# Patient Record
Sex: Female | Born: 1975 | Race: White | Hispanic: No | Marital: Married | Smoking: Never smoker
Health system: Southern US, Community
[De-identification: ages and names within clinical notes are randomized; demographics above are authoritative.]

## PROBLEM LIST (undated history)

## (undated) DIAGNOSIS — R519 Headache, unspecified: Secondary | ICD-10-CM

## (undated) DIAGNOSIS — M797 Fibromyalgia: Secondary | ICD-10-CM

## (undated) DIAGNOSIS — Z803 Family history of malignant neoplasm of breast: Secondary | ICD-10-CM

## (undated) DIAGNOSIS — Z9889 Other specified postprocedural states: Secondary | ICD-10-CM

## (undated) DIAGNOSIS — J45909 Unspecified asthma, uncomplicated: Secondary | ICD-10-CM

## (undated) DIAGNOSIS — Z8042 Family history of malignant neoplasm of prostate: Secondary | ICD-10-CM

## (undated) DIAGNOSIS — F32A Depression, unspecified: Secondary | ICD-10-CM

## (undated) DIAGNOSIS — M722 Plantar fascial fibromatosis: Secondary | ICD-10-CM

## (undated) DIAGNOSIS — K219 Gastro-esophageal reflux disease without esophagitis: Secondary | ICD-10-CM

## (undated) DIAGNOSIS — F840 Autistic disorder: Secondary | ICD-10-CM

## (undated) DIAGNOSIS — F419 Anxiety disorder, unspecified: Secondary | ICD-10-CM

## (undated) DIAGNOSIS — E039 Hypothyroidism, unspecified: Secondary | ICD-10-CM

## (undated) DIAGNOSIS — E282 Polycystic ovarian syndrome: Secondary | ICD-10-CM

## (undated) DIAGNOSIS — E669 Obesity, unspecified: Secondary | ICD-10-CM

## (undated) DIAGNOSIS — Z8489 Family history of other specified conditions: Secondary | ICD-10-CM

## (undated) DIAGNOSIS — Z8051 Family history of malignant neoplasm of kidney: Secondary | ICD-10-CM

## (undated) DIAGNOSIS — R112 Nausea with vomiting, unspecified: Secondary | ICD-10-CM

## (undated) DIAGNOSIS — M1990 Unspecified osteoarthritis, unspecified site: Secondary | ICD-10-CM

## (undated) DIAGNOSIS — Z8719 Personal history of other diseases of the digestive system: Secondary | ICD-10-CM

## (undated) DIAGNOSIS — F329 Major depressive disorder, single episode, unspecified: Secondary | ICD-10-CM

## (undated) DIAGNOSIS — J189 Pneumonia, unspecified organism: Secondary | ICD-10-CM

## (undated) DIAGNOSIS — F319 Bipolar disorder, unspecified: Secondary | ICD-10-CM

## (undated) HISTORY — DX: Unspecified asthma, uncomplicated: J45.909

## (undated) HISTORY — PX: CHOLECYSTECTOMY: SHX55

## (undated) HISTORY — DX: Autistic disorder: F84.0

## (undated) HISTORY — PX: ABDOMINAL HYSTERECTOMY: SHX81

## (undated) HISTORY — DX: Family history of malignant neoplasm of kidney: Z80.51

## (undated) HISTORY — PX: SHOULDER SURGERY: SHX246

## (undated) HISTORY — DX: Obesity, unspecified: E66.9

## (undated) HISTORY — PX: APPENDECTOMY: SHX54

## (undated) HISTORY — DX: Hypothyroidism, unspecified: E03.9

## (undated) HISTORY — DX: Plantar fascial fibromatosis: M72.2

## (undated) HISTORY — DX: Family history of malignant neoplasm of breast: Z80.3

## (undated) HISTORY — DX: Polycystic ovarian syndrome: E28.2

## (undated) HISTORY — DX: Family history of malignant neoplasm of prostate: Z80.42

## (undated) HISTORY — PX: LEG SURGERY: SHX1003

## (undated) HISTORY — PX: DILATION AND CURETTAGE OF UTERUS: SHX78

## (undated) HISTORY — PX: FRACTURE SURGERY: SHX138

---

## 2000-04-23 ENCOUNTER — Ambulatory Visit (HOSPITAL_COMMUNITY): Admission: RE | Admit: 2000-04-23 | Discharge: 2000-04-23 | Payer: Self-pay | Admitting: *Deleted

## 2000-04-23 ENCOUNTER — Encounter: Payer: Self-pay | Admitting: *Deleted

## 2000-08-14 ENCOUNTER — Ambulatory Visit (HOSPITAL_COMMUNITY): Admission: RE | Admit: 2000-08-14 | Discharge: 2000-08-14 | Payer: Self-pay | Admitting: *Deleted

## 2000-08-29 ENCOUNTER — Ambulatory Visit (HOSPITAL_COMMUNITY): Admission: AD | Admit: 2000-08-29 | Discharge: 2000-08-29 | Payer: Self-pay | Admitting: *Deleted

## 2000-09-05 ENCOUNTER — Inpatient Hospital Stay (HOSPITAL_COMMUNITY): Admission: AD | Admit: 2000-09-05 | Discharge: 2000-09-07 | Payer: Self-pay | Admitting: *Deleted

## 2001-06-30 ENCOUNTER — Ambulatory Visit (HOSPITAL_COMMUNITY): Admission: RE | Admit: 2001-06-30 | Discharge: 2001-06-30 | Payer: Self-pay | Admitting: Family Medicine

## 2001-06-30 ENCOUNTER — Encounter: Payer: Self-pay | Admitting: Family Medicine

## 2002-02-17 ENCOUNTER — Emergency Department (HOSPITAL_COMMUNITY): Admission: EM | Admit: 2002-02-17 | Discharge: 2002-02-17 | Payer: Self-pay | Admitting: Emergency Medicine

## 2002-02-17 ENCOUNTER — Encounter: Payer: Self-pay | Admitting: Emergency Medicine

## 2002-05-11 ENCOUNTER — Encounter: Payer: Self-pay | Admitting: Orthopedic Surgery

## 2002-05-11 ENCOUNTER — Ambulatory Visit (HOSPITAL_COMMUNITY): Admission: RE | Admit: 2002-05-11 | Discharge: 2002-05-11 | Payer: Self-pay | Admitting: Orthopedic Surgery

## 2003-07-14 ENCOUNTER — Inpatient Hospital Stay (HOSPITAL_COMMUNITY): Admission: AD | Admit: 2003-07-14 | Discharge: 2003-07-15 | Payer: Self-pay | Admitting: *Deleted

## 2003-07-21 ENCOUNTER — Inpatient Hospital Stay (HOSPITAL_COMMUNITY): Admission: AD | Admit: 2003-07-21 | Discharge: 2003-07-25 | Payer: Self-pay | Admitting: *Deleted

## 2004-06-13 ENCOUNTER — Ambulatory Visit (HOSPITAL_COMMUNITY): Admission: RE | Admit: 2004-06-13 | Discharge: 2004-06-13 | Payer: Self-pay | Admitting: *Deleted

## 2004-06-20 ENCOUNTER — Inpatient Hospital Stay (HOSPITAL_COMMUNITY): Admission: RE | Admit: 2004-06-20 | Discharge: 2004-06-22 | Payer: Self-pay | Admitting: *Deleted

## 2005-08-07 ENCOUNTER — Inpatient Hospital Stay (HOSPITAL_COMMUNITY): Admission: RE | Admit: 2005-08-07 | Discharge: 2005-08-08 | Payer: Self-pay | Admitting: General Surgery

## 2005-08-07 ENCOUNTER — Encounter (INDEPENDENT_AMBULATORY_CARE_PROVIDER_SITE_OTHER): Payer: Self-pay | Admitting: Specialist

## 2006-09-20 ENCOUNTER — Emergency Department (HOSPITAL_COMMUNITY): Admission: EM | Admit: 2006-09-20 | Discharge: 2006-09-20 | Payer: Self-pay | Admitting: Emergency Medicine

## 2006-09-22 ENCOUNTER — Ambulatory Visit (HOSPITAL_COMMUNITY): Admission: RE | Admit: 2006-09-22 | Discharge: 2006-09-22 | Payer: Self-pay | Admitting: Family Medicine

## 2007-10-29 ENCOUNTER — Emergency Department (HOSPITAL_COMMUNITY): Admission: EM | Admit: 2007-10-29 | Discharge: 2007-10-29 | Payer: Self-pay | Admitting: Emergency Medicine

## 2008-02-25 ENCOUNTER — Ambulatory Visit (HOSPITAL_COMMUNITY): Admission: RE | Admit: 2008-02-25 | Discharge: 2008-02-25 | Payer: Self-pay | Admitting: Family Medicine

## 2008-10-20 ENCOUNTER — Ambulatory Visit (HOSPITAL_COMMUNITY): Admission: RE | Admit: 2008-10-20 | Discharge: 2008-10-20 | Payer: Self-pay | Admitting: Family Medicine

## 2010-02-04 ENCOUNTER — Encounter: Payer: Self-pay | Admitting: Orthopedic Surgery

## 2010-06-01 NOTE — H&P (Signed)
NAME:  [name], [name]                           ACCOUNT NO.:  [id number]   MEDICAL RECORD NO.:  [id number]                   PATIENT TYPE:  INP   LOCATION:  LDR1                                 FACILITY:  [facility]   PHYSICIAN:  Langley Gauss, M.D.                DATE OF BIRTH:  [birth year]   DATE OF ADMISSION:  07/21/2003  DATE OF DISCHARGE:                                HISTORY & PHYSICAL   The patient is a 36 year old gravida 5, para 2, abortus 2 at 59 weeks'  gestation who presented to [facility], on the p.m. of July 21, 2003,  complaining of onset of uterine contractions times several hours duration.  She had timed them and when they were noted to be every five minutes x 2  hours duration with increasing strength, she presented to [facility].  On initial presentation to labor and delivery, she is noted to be  moderately uncomfortable with the uterine contractions occurring every 3-5  minutes.   PRENATAL COURSE:  She received prenatal care in [place].  Her husband  being active duty Hotel manager.  Review of the records supplied reveals no  prenatal problems.  She did have a normal glucose tolerance test.  It was  too early for a Group B strep testing for carrier status.  She has had  ultrasounds which have confirmed her EDC.  She is known to be a nonsmoker.  She did have to use her albuterol inhaler occasionally during the pregnancy  for asthmatic bronchitis.   PAST MEDICAL HISTORY:  1. She does have a history of postpartum depression following two prior     deliveries.  Has previously taken Prozac prior to and following     deliveries.  2. Has a history of asthma.  3. History of D&C x 2.  4. History of irritable bowel syndrome.  5. Hospitalized for pneumonia in 1989.  6. She has arthroscopic surgery of her shoulder, September 2004.  7. Appendectomy, 1986.   She is a nonsmoker.   PAST OBSTETRICAL HISTORY:  1. Two prior first trimester ABs, November  2000.  2. Vaginal delivery at [facility] without complications, August 15, 2000, at 38     weeks' gestation.  3. Delivered without complication at [facility] that labor and     delivery was complicated only by a very short second stage.  Patient     stating that she pushed three times.   She has no known drug allergies.   CURRENT MEDICATIONS:  Prenatal vitamins, albuterol inhaler on a p.r.n. basis  only.  Currently not receiving any tocolytic medications.   PHYSICAL EXAMINATION:  GENERAL:  She appears healthy, no acute distress, is  complaining of regular uterine contractions.  VITAL SIGNS:  Initial vital signs:  Temperature 98.7, pulse 93, respiratory  rate is 18, blood pressure 112/64.  HEENT:  Negative.  No adenopathy.  NECK:  Supple.  Thyroid is not palpable.  LUNGS:  Clear.  CARDIOVASCULAR:  Regular rate and rhythm.  ABDOMEN:  Soft and nontender.  No surgical scars were identified other than  an appendectomy scar.  She is vertex presentation by Leopold's maneuver.  Fundal height of 38-cm.  The uterus is soft and nontender.  No uterine  tenderness elicited.  EXTREMITIES:  Noted to be normal.  PELVIC:  Normal external genitalia.  No lesions or ulcerations identified.  No leakage of fluid.  No vaginal bleeding is identified.  Cervix, on initial  examination per the nursing staff, July 21, 2003, revealed the cervix to be 1-  to -2-cm dilated, 25% effaced, and -2 station with the contractions  occurring every 2-3 minutes.   ASSESSMENT/PLAN:  The patient 45 weeks' gestation, gravida 5, para 2, good  dating criteria.  She has received two doses of betamethasone during  hospitalization ten days previously, where she was hospitalized here for  preterm labor.  She did require magnesium sulfate tocolysis at that time.  She did well during that hospitalization, was discharged home on no  tocolytics.  She has done well since that time on modified bed rest but had  onset of uterine  contractions, on July 21, 2003, now of several hours  duration.  The patient was admitted and started on intravenous ampicillin in  an effort to prevent vertical transmission to the infant, as she is at  increased risk for Group B strep carrier status.   HOSPITAL COURSE:  The patient continued to have regular uterine  contractions; however, after midnight, July 22, 2003, the patient had spacing  out of the contractions and was able to sleep during the evening.  She was  again evaluated in the a.m. of July 22, 2003, at which time by my examination  she was noted to have some cervical change to 2-cm dilated, 60% effaced,  with a vertex and -1 station.  Fetal heart rate remained reassuring with  accelerations noted.  No fetal heart rate decelerations.  The patient was  continued to be managed expectantly during the early day July 22, 2003.  She  did note that she did begin having increased frequency and intensity of  uterine contractions again, placed again on external fetal monitor,  contractions noted to be occurring every 3-4 minutes but moderate in  intensity.  At that time, she was examined, noted to be 3-cm dilated, 70%  effaced, with a vertex at a 0 station and palpable intact bulging membranes.  With the diagnosis of active preterm labor with cervical change made at 37  weeks' gestation, the admission is continued.  The patient is not a  candidate for tocolytics at 36 weeks' gestation, rather with the  inevitability of her delivering prematurely amniotomy is performed with  placement of fetal scalp electrodes.  Clear amniotic fluid is noted.  The  patient will be observed and the contraction pattern will be augmented if  clinically indicated.  She is, at this time, continued on the IV ampicillin  q.4h.  She does plan on receiving epidural analgesic during the course of  labor.     ___________________________________________                                         Langley Gauss,  M.D.  DC/MEDQ  D:  07/22/2003  T:  07/22/2003  Job:  [id number] 

## 2010-06-01 NOTE — H&P (Signed)
NAME:  [name], [name]                           ACCOUNT NO.:  [id number]   MEDICAL RECORD NO.:  [id number]                   PATIENT TYPE:  INP   LOCATION:  A416                                 FACILITY:  [facility]   PHYSICIAN:  Lazaro Arms, M.D.                DATE OF BIRTH:  1975/11/26   DATE OF ADMISSION:  07/14/2003  DATE OF DISCHARGE:                                HISTORY & PHYSICAL   HISTORY OF PRESENT ILLNESS:  [name] is a 35 year old white female, gravida 5,  para 2, abortus 2, with 2 living children, with estimated date of delivery  of August 24, 2003 by a last menstrual period of November 17, 2002 and  ultrasound at 10 weeks and 3 days and an ultrasound at 20 weeks and 3 days,  all confirming her estimated date of delivery.  She is now at 34-4/7 weeks'  gestation.  She has been followed during the pregnancy at [facility] in [place].  She was actually here visiting her  mother in town; she is from here originally.  She actually delivered her  first child over at [facility] and she delivered her second child here at  [facility].  Dr. Langley Gauss was her physician until she moved as a  result of [employer] obligations.  The patient states the pregnancy has otherwise  been unremarkable.  According to the records I have faxed, she has only had  a total of 5 OB visits during the pregnancy, the last one being July 01, 2003.  Up to this point, the pregnancy, it appears and by history, has been  unremarkable.  The patient states she was having regular contractions  yesterday and got off her feet and they went away; she did the same thing  this evening but they did not go away.  She presented to labor and delivery  and was having sort of an irritable pattern every 2-3 minutes but they were  becoming palpably stronger.  Her cervix was soft, posterior, closed, 50%  effaced.  She was given 2 doses of subcu terbutaline without any effect at  all.  Her urinalysis is  clear.  Her CBC is normal with a normal white count.  The patient denies any bleeding, any fever; no rupture of membranes and she  states the baby has been moving well.  On the monitor stripe, the baby has  had a reactive NST.  Because of the frequent contractions unresponsive to  terbutaline, she was started on magnesium sulfate tocolysis at 6 g bolus and  2 g an hour.  Foley catheter was placed, group B strep culture was obtained  and she was begun on ampicillin prophylaxis.   PAST MEDICAL HISTORY:  Past medical history is significant for postpartum  depression x2.  Also, she has asthma and seasonal allergies and irritable  bowel syndrome.   PAST SURGICAL HISTORY:  Her surgeries include an arthroscopy of her shoulder  and she has had 2 miscarriages.  It is difficult to read this surgery  record; she also appears to have had an appendectomy.   PAST OBSTETRICAL HISTORY:  She has had 2 miscarriages and 2 vaginal  deliveries.   MEDICATIONS:  Her medications are albuterol inhaler, prenatal vitamins and  iron.   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   REVIEW OF SYSTEMS:  Review of systems today otherwise negative.   PHYSICAL EXAMINATION:  HEENT:  Unremarkable.  NECK:  Thyroid is normal.  LUNGS:  Lungs clear.  HEART:  Regular rate and rhythm without murmur, regurgitation or gallop.  BREASTS:  Deferred.  ABDOMEN:  Fundal height of 36 cm.  Cervix is closed, soft and posterior, 50%  effaced and fetal vertex is applied.  EXTREMITIES:  Extremities are warm with 1+ edema.   PRENATAL LABORATORY DATA:  Blood type is A-positive.  Antibody screen is  negative.  Hepatitis B is negative.  RPR is nonreactive.  HIV was negative.  Rubella is immune.  Triple screen was not done.  Glucola was 119.  Pap smear  was normal.  GC and Chlamydia are both negative.  She has not had a group B  strep done yet.   IMPRESSION:  1. Intrauterine pregnancy at 34-1/2 weeks'  gestation.  2. Preterm labor.  3. Patient followed during the pregnancy at [facility].   PLAN:  The patient was admitted.  We will give her magnesium sulfate  prophylaxis.  Again, group B strep culture was obtained and she was started  on ampicillin prophylaxis as well.     ___________________________________________                                         Lazaro Arms, M.D.   LHE/MEDQ  D:  07/14/2003  T:  07/14/2003  Job:  [id number]

## 2010-06-01 NOTE — Group Therapy Note (Signed)
NAME:  [name], [name] [name]                           ACCOUNT NO.:  [id number]   MEDICAL RECORD NO.:  [id number]                   PATIENT TYPE:  INP   LOCATION:  A427                                 FACILITY:  [facility]   PHYSICIAN:  Langley Gauss, M.D.                DATE OF BIRTH:  December 22, 1975   DATE OF PROCEDURE:  08/23/2003  DATE OF DISCHARGE:  07/25/2003                                   PROGRESS NOTE   HISTORY OF PRESENT ILLNESS:  [name] [name] [name] is a multiparous patient who  underwent vaginal delivery utilizing epidural analgesic on 07/22/2003.  Delivery itself was without complications.  The patient was discharged to  home on postpartum day #1-1/2, infant circumcision being performed just  prior to discharge.  The patient now returns for postpartum check.  She is  both bottle and breast feeding and plans on breast feeding up to four months  postpartum.  Birth control options are discussed today.  The patient states  that her husband will be proceeding with a vasectomy, but this will have to  go through the [employer] channels.  The patient hopes to have this done by  the first of the year.  He is in agreement with having this procedure  performed.  However, in the meantime, the patient would like some birth  control pending performance of this procedure.  She has previously used  NuvaRing without complications.  Thus, she will begin NuvaRing in two weeks'  time or six weeks postpartum.  She was given one sample pack.  Prescription  is also written.  She will be returning to the [facility]. area tomorrow.  If it is  possible for [place] and additional samples become available, [name] [name] [name] would be  able to mail these to [place].  The patient will be continuing gynecological  [place].      ___________________________________________                                            Langley Gauss, M.D.   DC/MEDQ  D:  08/23/2003  T:  08/24/2003  Job:  [id number]

## 2010-06-01 NOTE — Discharge Summary (Signed)
NAME:  [name], [name]                           ACCOUNT NO.:  [id number]   MEDICAL RECORD NO.:  [id number]                   PATIENT TYPE:  INP   LOCATION:  A416                                 FACILITY:  [facility]   PHYSICIAN:  Langley Gauss, M.D.                DATE OF BIRTH:  08-28-1975   DATE OF ADMISSION:  07/13/2003  DATE OF DISCHARGE:                                 DISCHARGE SUMMARY   DATES AS FOLLOWS:  Patient initially placed on observation status p.m. of  July 13, 2003, per Dr. Duane Lope on cross coverage arrangement; on July 14, 2003, this was changed to an admission status per Dr. Langley Gauss,  first day hospital care, two hours duration on the floor and then contact  with the patient and then reviewing records.  Discharged to home on July 15, 2003, greater than 30 minutes duration.   DISPOSITION:  The patient is currently [place] from the [place]  area, but her husband is hesitant to have her return trip via her car, thus,  she may be staying in the area and continuing her prenatal care here.  If  that is the case, she will be seen in our office in 5-6 days time for  initiation of prenatal care.  Her records from [place] are  available on Labor and Delivery.  The patient is advised to restrict her  activity to modified bed rest.  She is not discharged home on any  tocolytics.  During this pregnancy, the patient did receive 12 mg IM  betamethasone on July 14, 2003 and repeated again on July 15, 2003.  Group B  strep culture was performed by Dr. Omelia Blackwater here on July 13, 2003.  The  patient was treated empirically with IV ampicillin following performance of  the culture.   LABORATORY STUDIES:  Electrolytes within normal limits with potassium of  4.1, magnesium level obtained during the course of the infusion 2.6,  hemoglobin 10.1, hematocrit 29.1, white count 12.2.  Urinalysis completely  negative.  Group B strep culture is currently pending.  A positive  blood  type.  Additional magnesium sulfate level obtained just prior to  discontinuation of the infusion was 4.5.   HOSPITAL COURSE:  The patient presented the p.m. of July 13, 2003, in active  preterm labor.  She was evaluated and treated by Dr. Duane Lope on cross  coverage.  Initially, she received two doses of subcu __________which failed  to have any effect at all on her preterm uterine contractions, or at any  point in time did the patient demonstrate any significant cervical change  during this hospitalization other than softening of the cervix.  The patient  was started on magnesium sulfate infusion per Dr. Duane Lope the a.m. of  July 14, 2003.  Subsequently, in the early a.m. of July 14, 2003, the  patient was noted to have  cessation of uterine activity.  In addition, was  noted to have developed chest pain, nausea, flushing and headache as side  effect of the magnesium sulfate.  Thus, it was discontinued.  Subsequently,  the patient had no complaints of uterine contractions.  She has ambulated  with no change in uterine activity.  She describes good fetal movement.  No  change in vaginal discharge or cervical mucous.  Nonstress test  interpretations of July 14, 2003, indications, preterm labor.  Fetal heart  rate baseline 150.  Accelerations noted.  No fetal heart rate decelerations  noted.  Normal long term variability.  Small amount of uterine irritability  noted with two uterine contractions during a 45 minute period only.  Nonstress test July 15, 2003, indications, preterm labor.  Nonstress test  reactive.  Fetal heart rate baseline 145.  Accelerations noted.  NO fetal  heart rate decelerations noted.  No uterine activity identified on external  fetal monitor.  Reactive nonstress test.   ADDITIONAL STUDIES:  Ultrasound was performed in the Department of Radiology  on July 14, 2003, with the findings of appropriate growth, 35+ weeks  gestation, single pregnancy.  The  patient is now discharged to home on  July 15, 2003.  Signs and symptoms of labor reviewed with the patient.  After  reviewing the patient's prenatal record, her EDC is determined to be August 21, 2003, per a 10 week crown-rump length.  This is very consistent with  subsequent ultrasound at 20 weeks and also correlates well with patient's  dated last menstrual period.     ___________________________________________                                         Langley Gauss, M.D.   DC/MEDQ  D:  07/15/2003  T:  07/15/2003  Job:  [id number]

## 2010-06-01 NOTE — Op Note (Signed)
[name], BALIK NO.:  [id number]   MEDICAL RECORD NO.:  [id number]          PATIENT TYPE:  INP   LOCATION:  A428                          FACILITY:  [facility]   PHYSICIAN:  Langley Gauss, MD     DATE OF BIRTH:  28-Apr-1975   DATE OF PROCEDURE:  DATE OF DISCHARGE:                                 OPERATIVE REPORT   completion of operative report     After transection and double ligation of the utero-ovarian ligaments, the  uterus was removed with the cervix attached. There was noted be a small  uterine fundus. Examination at that time revealed the tacked pedicles which  were the utero-ovarian pedicles as well as the uterosacral ligament pedicles  bilaterally. There was also a suture in the midline of the posterior vaginal  cuff. There was some delay in the surgical procedure at this time as the  needle count was then correct x1. Thus, the peritoneum was not closed until  the needle count was corrected. X-ray had already been contacted and was  present when the needle was found by the nursing staff to be underneath the  needle box. The peritoneum was then closed in a continuous running purse-  string suture with bites taken very superficially but proximal to the  ligatures on her pedicles which essentially extraperitonealized the  pedicles. At this point in time, examination revealed continued bleeding  from the posterior vaginal cuff. The posterior vaginal cuff was closed with  a 0 Vicryl continuous running lock suture starting from one uterosacral  ligament to the other. The uterosacral ligaments were tied together in the  midline as well as to the suture in the posterior vaginal cuff. Sutures  present on the utero-ovarian ligaments were also tied together in the  midline. Examination at this time reveals that after Dr. Rito Ehrlich had placed  the tapes for suspension of the urethra there was noted to be only a small  cystocele present. Thus, in an effort to avoid  shortening of the vaginal  depth, an anterior repair is not performed. Dr. Rito Ehrlich did come into the  operating room and completed his portion of the TTT with the tapes pulled  through and skin closure performed. At this point time, there is minimal  active bleeding occurring. Posterior repair was performed as follows:  The  posterior vaginal vestibule is identified. Allis clamps are placed laterally  at the hymenal ring. Sharp knife was used to transect between the two. A  triangular portion of skin was then removed on the perineum. The posterior  vaginal mucosa was then identified. Dissection was performed between the  posterior vaginal mucosa and the underlying perirectal fascia in the  avascular plane. This was continued up to the apex of the rectocele.  Pertinently, no enterocele was identified. Excess vaginal mucosa was then  excised bilaterally. The pararectal fascia is then brought together in the  midline utilizing horizontal mattress sutures of 0 Vicryl suture; pop-offs  were utilized. This does result in  reduction of the rectocele and closure  of this anatomic defect. At no point  in time was any injury to the rectum  noted. After the pararectal fascia was reapproximated in the midline  resulting in excellent support. The posterior vaginal mucosa was closed with  a continuous running 0 chromic suture followed by a continuous running 0  chromic suture on the perineal body, restoring the normal anatomy.  Examination at this time reveals excellent support post anteriorly and  posteriorly. Vaginal cuff was likewise well supported. There was no  significant shortening or narrowing of the vaginal vault. Foley catheter was  replaced with findings of clear yellow urine. The vaginal packing saturated  with a MetroGel solution is then placed within the vaginal vault. The  patient tolerated the  procedure very well. She was extubated, taken to recovery room in stable  condition at which  time the operative findings discussed with the patient's  awaiting family.   MEDICATIONS:  Gross       DC/MEDQ  D:  06/21/2004  T:  06/21/2004  Job:  [id number]

## 2010-06-01 NOTE — Discharge Summary (Signed)
NAMEANILA, BOJARSKI NO.:  [id number]   MEDICAL RECORD NO.:  [id number]          PATIENT TYPE:  INP   LOCATION:  A428                          FACILITY:  [facility]   PHYSICIAN:  Langley Gauss, MD     DATE OF BIRTH:  1975/02/11   DATE OF ADMISSION:  06/20/2004  DATE OF DISCHARGE:  LH                                 DISCHARGE SUMMARY   PROCEDURES PERFORMED:  1.  Vaginal hysterectomy by Dr. Roylene Reason. Lisette Grinder.  2.  TVT by Dr. Dennie Maizes.  3.  Posterior repair by Dr. Roylene Reason. Lisette Grinder.   DISPOSITION:  The patient is to follow up in the office in two weeks time  for reassessment of vaginal cuff.  She does have the remainder of the tube  of Metrogel to use intravaginally p.r.n. for onset of vaginal discharge.   DISCHARGE MEDICATIONS:  Peri-Colace one p.o. daily p.r.n. constipation, #30,  with one refill.  The patient is poorly tolerant of any codeine containing  products.  She states she feels she would do well on p.o. Extra-Strength  Tylenol.  She is in addition given a trial of Darvocet-N 100.   She is advised to withhold sexual intercourse x4-6 weeks' duration.  She is  also advised that she may have some light vaginal bleeding or abnormal  discharge.   PERTINENT LABORATORY STUDIES:  Admission hemoglobin and hematocrit  14.3/42.0.  Postpartum day #1 10.3/29.5 with a white count of  7.9.  A  positive blood type.  Normal liver function tests.  Normal electrolytes,  pertinently, potassium normal at 4.2.   HOSPITAL COURSE:  The patient admitted date of service June 20, 2004 after  performance of the TVT by Dr. Dennie Maizes.  There was minimal cystocele  present, thus an anterior repair or anterior colporrhaphy was not required.  Posterior repair performed without difficulty.  Postoperatively, the patient  did well.  Had Foley catheter and vaginal packing removed on postoperative  day #1.  Had very scant bleeding after removal of the packing.  On  postoperative  day #2, the Foley catheter was removed.  The patient  subsequently has voided x4 with no complaints of urinary retention.  In  addition, feels as though she is completely emptying the bladder.  The  patient is thus discharged to home on postoperative day #2.       DC/MEDQ  D:  06/22/2004  T:  06/22/2004  Job:  [id number]

## 2010-06-01 NOTE — Op Note (Signed)
[name], [name]                 ACCOUNT NO.:  [id number]   MEDICAL RECORD NO.:  [id number]          PATIENT TYPE:  AMB   LOCATION:  DAY                           FACILITY:  [facility]   PHYSICIAN:  Dennie Maizes, M.D.   DATE OF BIRTH:  1975/06/26   DATE OF PROCEDURE:  06/20/2004  DATE OF DISCHARGE:                                 OPERATIVE REPORT   PREOPERATIVE DIAGNOSIS:  Stress urinary incontinence, pelvic relaxation.   POSTOPERATIVE DIAGNOSIS:  Stress urinary incontinence, pelvic relaxation.   OPERATIVE PROCEDURE:  Transvaginal tape.  Subureteral sling procedure (Bard  Uretex system).   SURGEON:  Dennie Maizes, M.D.   ANESTHESIA:  General.   ESTIMATED BLOOD LOSS:  Minimal.   COMPLICATIONS:  None.   DRAINS:  16-French Foley catheter in the bladder.   INDICATIONS FOR PROCEDURE:  This 35 year old female had pelvic relaxation,  associated troublesome stress urinary incontinence. She was taken to the OR  today for transvaginal tape, suburethral sling procedure for correction of  stress urine incontinence. Dr. Lisette Grinder plan to do vaginal hysterectomy,  anterior and posterior repair at the same time.   DESCRIPTION OF PROCEDURE:  General anesthesia was induced and the patient  was placed on the OR table in the high lithotomy position. The lower abdomen  and genitalia were prepped and draped in a sterile fashion. An 18-French  Foley catheter was inserted into the bladder and clear urine was drained.  The pubic tubercles were marked on the skin. A point about 1.5 cm laterally  over the pubic tubercles were marked on the suprapubic area on both sides.  14 cc of sterile saline was injected in the paravesical area for  hydrodissection on both sides. The mid ureter was then held with Allis  forceps.  About 5 cc of sterile saline was injected submucosally in  periurethralareas. The midline ureteral incision was then made.  Submucosal  planes were created on both sides by sharp and blunt  dissection. The trocar  carrying the blue guide tube was then inserted on the right side with distal  guidance.  The probe trocar was inserted behind the pubic ramus to exit  through the previously marked area on the skin. Cystoscopy was done and the  trocar was found to be outside the bladder. The guide tube was then pulled  through the suprapubic incision. A similar procedure was done on the left  side. Cystoscopy revealed good position of the trocar. The guide tube was  then pulled into the suprapubic area. The Prolene mesh was attached to the  guide tubes and pulled through the suprapubic incision.   Dr. Lisette Grinder then proceeded with the vaginal hysterectomy. After conclusion  of the hysterectomy, I rescrubbed and adjusted the sling. The Foley catheter  was inserted in the bladder and clear urine was drained. The tension of the  Prolene mesh was adjusted. After this a pair of Metzenbaum scissors could be  easily introduced between the urethra and the mesh. The plastic tubes were  then removed from both sides. The redundant mesh on both sides were excised  at the  level of subcutaneous tissues. The ureteral incision was then closed  using 3-0 Vicryl. The suprapubic incisions were closed using 4-0  subcuticular stitches. The Foley catheter was draining clear urine and was  connected to the bag. The estimated blood loss for this procedure was  minimal. The sponges and instruments were correct x 2 at the time of  closure.  Dr. Lisette Grinder then proceeded with posterior repair.       SK/MEDQ  D:  06/20/2004  T:  06/20/2004  Job:  [id number]   cc:   Langley Gauss, MD  [address]  [place]  [place] [address]  Fax: [phone]

## 2010-06-01 NOTE — Group Therapy Note (Signed)
NAME:  [name], ALVAREZ                           ACCOUNT NO.:  [id number]   MEDICAL RECORD NO.:  [id number]                   PATIENT TYPE:  INP   LOCATION:  A416                                 FACILITY:  [facility]   PHYSICIAN:  Langley Gauss, M.D.                DATE OF BIRTH:  [birth year]   DATE OF PROCEDURE:  DATE OF DISCHARGE:                                   PROGRESS NOTE   HISTORY:  Observation status initiated July 13, 2003.  Changed to admission  status on July 14, 2003.  First day hospital care July 14, 2003 per Dr.  Langley Gauss.  Observation status July 13, 2003 per Dr. Duane Lope.  The  patient's complete medical record during this hospital stay reviewed at this  time by Dr. Lisette Grinder.  In addition the prenatal records were obtained from  [facility], [place], [place].  The patient is a 35-year-  old gravida 5, para 2, with two prior term vaginal deliveries who is  currently at 34-5/[redacted] weeks gestation with an [facility] of August 21, 2003.  This EDC  is obtained by reviewing the patient's medical record from [facility].  The patient states her last menstrual period is certain starting  November 17, 2002.  However, it was longer flow than usual.  The patient  otherwise has had an ultrasound done at ten weeks gestation, giving her an  [facility] of August 21, 2003 and a 21-1/2 week ultrasound giving her an [facility] of  August 23, 2003.  Utilizing the August 21, 2003 [facility] this places her at 34-5/[redacted]  weeks gestation.  The patient is residing in [place], [place].  Two days  previously she traveled here by personal vehicle to visit her mother.  She  states that about six hours after arriving here, she had the onset of some  mild irregular uterine contractions, which continued x2 days duration.  She  was getting up every night on an hourly basis to use the restroom.  However,  on the day of July 13, 2003 she states that the contractions increased in  their frequency and intensity and  after she timed seven occurring in one  hour duration, she had contacted her sister-in-law, at which time she was  advised to present to [facility].   The patient's prenatal course to date has been uncomplicated.  She has the  ultrasounds described previously.  She has had to use her albuterol inhaler  a total of four times.  She is taking Rolaids for reflux.  She is a  nonsmoker.  She has had three glucose tolerance tests done during this  pregnancy, which have all been normal.  The remainder of the prenatal  laboratory studies within normal limits.   REVIEW OF SYSTEMS:  The patient denies any significant change in vaginal  discharge.  No evidence of any vaginitis.  No leakage of fluid.  No dysuria  or hematuria but has had frequency throughout the pregnancy.   PAST MEDICAL HISTORY:  1. History of asthma.  2. Previously diagnosed with irritable bowel syndrome.  3. She had pneumonia in 1989.  4. She had a D&C in 1999 following a spontaneous AB.  5. Arthroscopic surgery on her shoulder on September 16, 2002.  6. Appendectomy in 1986.   SOCIAL HISTORY:  Denies alcohol, cigarette, or any drug use.  Her husband is  active duty Hotel manager, stationed in [place], [place].  The patient does have a  history of post partum depression x2 following previous deliveries that have  previously been treated with antidepressants.   PHYSICAL EXAMINATION:  GENERAL:  The patient is complaining of headaches,  flushing, sweating, nausea, and weakness as side effects of the magnesium  sulfate administered.  VITAL SIGNS:  Her blood pressure is 92/58, pulse rate of 80, respiratory  rate is 16.  HEENT:  Negative.  No adenopathy.  NECK:  Supple.  Thyroid is nonpalpable.  LUNGS:  Clear.  CARDIOVASCULAR:  Regular rate and rhythm.  ABDOMEN:  Soft and nontender.  Gravid uterus identified with a fundal height  of 37 consistent with her estimated gestational age.  She is vertex  presentation by Leopold's maneuver.   The uterine tone is normal.  No uterine  tenderness elicited.  EXTREMITIES:  Noted to be normal.  PELVIC:  Per Dr. Despina Hidden on initial presentation.  The cervix is described as  being closed, soft, and posterior.   HOSPITAL COURSE:  External fetal monitor on initial presentation.  The  patient was noted to be having irregular uterine contractions.  She was  initially treated with two doses of 0.25 mg of subcutaneous terbutaline,  which failed to have any change whatsoever in her uterine activity.  Thus,  per Dr. Duane Lope, she was converted to magnesium sulfate in the a.m. of  July 14, 2003.  She received a 6 g bolus followed by 3 g/hour, which  subsequently did result in complete cessation of uterine activity but did  give the aforementioned side effects described.  The patient has now been on  3 g of magnesium sulfate throughout the entire evening.  She is evaluated  this a.m.  She has just returned from radiology, where an ultrasound was  performed, which did confirm her estimated gestational age, though it does  not change her EDC.  She is noted to scan out at 35+ weeks with no  abnormalities identified.   ASSESSMENT/PLAN:  A 34-5/7 intrauterine pregnancy by ten week crown rump  length.  The patient with threatened preterm labor with previously mentioned  active uterine contractions by no cervical change.  The patient is poorly  tolerant of the intravenous magnesium sulfate.  At this point, if the  concern over her prematurity was significant enough to warrant initiation of  intravenous magnesium sulfate, the patient should likewise then be treated  with betamethasone 12 mg intramuscularly to be repeated in 24 hours to  enhance fetal lung maturity.  Due to the poor tolerance of the magnesium  sulfate and the effect of tocolysis as well as no cervical change, she will  now be weaned on down and then off of the intravenous magnesium sulfate, after which time the Foley catheter can be  removed.  The patient the  subsequently will be managed expectantly to evaluate for return of uterine  contractions, keeping in mind that to this point she has experienced no  cervical change.  FIRST DAY OF HOSPITAL CARE:  July 14, 2003   DURATION:  90 minutes.      ___________________________________________                                            Langley Gauss, M.D.   DC/MEDQ  D:  07/14/2003  T:  07/14/2003  Job:  [id number]

## 2010-06-01 NOTE — Consult Note (Signed)
NAMECHASELYN, [name]                 ACCOUNT NO.:  [id number]   MEDICAL RECORD NO.:  [id number]          PATIENT TYPE:  AMB   LOCATION:  DAY                           FACILITY:  [facility]   PHYSICIAN:  Dennie Maizes, M.D.   DATE OF BIRTH:  12-Mar-1975   DATE OF CONSULTATION:  DATE OF DISCHARGE:                                   CONSULTATION   CHIEF COMPLAINT:  Bladder prolapse, urinary leakage.   CONSULTATION REPORT:  This 35 year old female is referred to me by Dr.  Lisette Grinder for evaluation of her urinary symptoms. She had pelvic relaxation  and she is scheduled to undergo vaginal hysterectomy with anterior and  posterior repair by Dr. Lisette Grinder.   The patient complained of having urinary leakage for about 3 years. Her  urinary leakage is getting worse at present. She has urinary leakage during  coughing and sneezing. She also has urinary frequency and urgency. She has  to void ever 1.5 hours during the day and she gets up x3 at night to pass  urine. The patient denied having any voiding difficulty, dysuria or  hematuria. There is no past history of urolithiasis or urinary tract  infections.   She has noticed a bulging identified for the past 6 months which is getting  worse. Evaluation has revealed uterine prolapse with associated cystocele  and rectocele. She has constipation due to her rectocele.   PAST MEDICAL HISTORY:  1. Status post right shoulder surgery.  2. D&C x2.  3. Status post appendectomy.  4. History of irritable bowel syndrome.     MEDICATIONS:  None.   ALLERGIES:  None.   FAMILY HISTORY:  Positive for diabetes mellitus, hypertension, carcinoma of  the cervix, prostate carcinoma and breast carcinoma.   SOCIAL HISTORY:  The patient is married, she has three children ages 80, 74  and 4 years.   PHYSICAL EXAMINATION:  VITAL SIGNS: Height is 5 feet, 4 inches. Weight 218  pounds.  HEENT:  Normal.  NECK:  No masses.  LUNGS:  Clear to auscultation.  HEART:  Regular  rate and rhythm, no murmurs.  ABDOMEN:  Soft, no palpable frank mass or CVA tenderness. Bladder not  palpable.  PELVIC EXAMINATION:  Revealed uterine prolapse, cystocele and rectocele.   Evaluation was done in the office. Catheterization produced 70 cc. She had  normal bladder sensation with a volume of 38 cc. Her bladder capacity was  small, 198 cc. There were no intra bladder infections. Leak point pressure  was more than 100 cm of water. The patient had modest stress urinary  incontinence in the upright and well as supine positions. Cystoscopy in the  office revealed a normal bladder. She had uterine prolapse, cystocele and  rectocele.   IMPRESSION:  Urinary frequency, urgency, stress urinary incontinence, pelvic  relaxation.   PLAN:  The patient reduced her fluid intake which relieved her urinary  frequency and urgency.  She has stress urinary incontinence in spite of exercises. She is scheduled  to undergo  a transvaginal urethral sling procedure. I explained to her regarding the  diagnosis, operative details,  alternate treatments, possible risks and  complications and she has agreed for the procedure to be done.  Intraoperative and postoperative complications have been explained to the  patient. I have informed her regarding infection, bleeding, injury to the  bladder, ureters, intestines and blood vessels. Postoperative urinary  retention and postoperative urinary incontinence have been explained to the  patient. Her questions have been answered.   Dr. Lisette Grinder plans to do a vaginal hysterectomy, anterior and posterior  repair.      SK/MEDQ  D:  06/13/2004  T:  06/13/2004  Job:  [id number]

## 2010-06-01 NOTE — Op Note (Signed)
[name], BRANDHORST NO.:  [id number]   MEDICAL RECORD NO.:  [id number]          PATIENT TYPE:  INP   LOCATION:  A428                          FACILITY:  [facility]   PHYSICIAN:  Barbaraann Barthel, M.D. DATE OF BIRTH:  1975/01/27   DATE OF PROCEDURE:  08/07/2005  DATE OF DISCHARGE:                                 OPERATIVE REPORT   SURGEON:  Dr. Malvin Johns.   PREOPERATIVE DIAGNOSES:  Cholecystitis and cholelithiasis.   POSTOPERATIVE DIAGNOSES:  Cholecystitis and cholelithiasis.   PROCEDURE:  Laparoscopic cholecystectomy.   SPECIMEN:  Gallbladder with stones.   This a 35 year old white female who had recurrent episodes of right upper  quadrant pain, nausea and vomiting.  She was seen by the medical service, a  sonogram was performed and revealed the presence of multiple small stones.  She was referred to surgery.  We had planned for an outpatient procedure.  Liver function studies and amylase were obtained and these were not  elevated.  We discussed the procedure with her in detail including  complications not limited to but including bleeding, infection, damage to  bile ducts, perforation of organs, transitory diarrhea and the possibility  that open surgery might be needed.  Informed consent was obtained.   GROSS OPERATIVE FINDINGS:  The patient had rather minimal adhesions, small  stones within the gallbladder. The right upper quadrant otherwise appeared  to be normal. A small cystic duct which was not cannulated for  cholangiogram.   TECHNIQUE:  The patient was placed in a supine position after the adequate  administration of general anesthesia via endotracheal intubation. Her entire  abdomen was prepped with Betadine solution and draped in the usual manner.  Following the aseptic technique, a Foley catheter had been placed. A  periumbilical incision was carried out over the superior aspect of the  umbilicus.  The fascia was grasped with a sharp towel clip and  a Veress  needle was inserted and confirmed the position with a saline drop test.  Then using the Visiport technique, an 11 mm cannula was placed in the  umbilicus and then under direct vision, three other cannulas were placed, an  11 mm cannulae in the epigastrium and two 5 mm cannulas in the right upper  quadrant laterally.  The gallbladder was grasped, its adhesions were taken  down the cystic duct was clearly visualized, triply silver clipped on the  side of the common bile duct and singly silver clipped on the side of the  gallbladder and divided, likewise the cystic artery.  The gallbladder was  then removed uneventfully from the liver bed.  There was a small amount of  spillage from the gallbladder.  This was easily sucked up, irrigated and  sucked up and removed with the Endosac catch device from the epigastric  incision.  After checking for hemostasis, I irrigated copiously with normal  saline solution, controlled the oozing with the cautery device and elected  to leave a piece of Surgicel within the liver bed.  No drain was  placed. Prior to closure, all sponge, needle, and instrument counts were  found to be correct.  The estimated blood loss was minimal.  The patient  received 1200 mL of crystalloids intraoperatively.  She was taken to the  recovery room in satisfactory condition.      Barbaraann Barthel, M.D.  Electronically Signed     WB/MEDQ  D:  08/07/2005  T:  08/07/2005  Job:  [id number]   cc:   Corrie Mckusick, M.D.  Fax: [phone]

## 2010-06-01 NOTE — H&P (Signed)
[name], Katie NO.:  [id number]   MEDICAL RECORD NO.:  [id number]          PATIENT TYPE:  AMB   LOCATION:  DAY                           FACILITY:  [facility]   PHYSICIAN:  Langley Gauss, MD     DATE OF BIRTH:  1975/04/10   DATE OF ADMISSION:  06/12/2004  DATE OF DISCHARGE:  LH                                HISTORY & PHYSICAL   ADMISSION HISTORY AND PHYSICAL   The patient is a 35 year old gravida 5, para 3 with three prior vaginal  deliveries.  Her most recent vaginal delivery performed on 07/21/03.  The  patient subsequently has come in complaining of specifically a bulge in her  vagina, genuine stress urinary incontinence with leakage, and occasional  constipation with a posterior vaginal bulge.  Initial date complaint for  these visits is 04/24/04.  The patient has actually ____________ at which  time she has noticed the bulge in the vagina which appears to be vaginal  tissue.  She is uncertain if this is the anterior or posterior portion that  is bulging.  On a couple of occasions, she has had enough of a bulge that  she has reduced it intravaginal.  She has also began to complain of deep  penetration dyspareunia with the perception that her husband is hitting  something.  She does give a very good history of genuine stress urinary  incontinent with leakage of small amounts of urine with increased intra-  abdominal pressure.  She also has occasional episodes of constipation where  she has the perception that the stool is present down low in the rectal area  but she is unable to actually initiate passage of the stool.  She has no  documented urinary tract infections.  She has done Kegel's exercise in an  effort to produce her pelvic tone but these have been unsuccessful.  The  patient is also having irregular menses.  She did breast feed until the  infant was four months old, then used NuvaRing.  However, after five months  of NuvaRing she has had the onset of  irregular menses frequently bleeding  every other week with passage of small clots.   The patient has been seen in consultation by Dr. ___________ who has  performed a complete evaluation.  In addition, cystoscopy has been performed  which confirms that patient would benefit from anterior repair with TVT to  include bladder functioning as well as reduce the pelvic pressure.   PAST MEDICAL HISTORY:  She has no known drug allergies.   CURRENT MEDICATIONS:  1.  Hydrochlorothiazide 25 mg p.o. daily x5 weeks duration only.  2.  She has most recently utilized NuvaRing for birth control purposes.  She      is now abstinent as she is present here in [place] and her      husband is deployed in the [employer] in [place], [place].   PAST MEDICAL HISTORY:  The patient has had a D&C x2 for a first trimester  spontaneous ABs.  Has a history of irritable bowel syndrome.  Has a  history  of pneumonia in 1989, appendectomy performed in 1986.  The patient underwent  arthroscopy of the right shoulder September 2004.   SOCIAL HISTORY:  Pertinent for patient being a nonsmoker.  She is employed  by [employer].  Her husband is active duty Hotel manager currently stationed in  [place], [place].  The patient is actually residing here in [place] at her  mother's home.   REVIEW OF SYSTEMS:  Pertinent for previous diagnosis of irritable bowel  syndrome with alternating constipation and diarrhea.  She also has history  of asthma which is well controlled with the albuterol inhaler.  The patient  does have some symptoms of mixed type incontinence with genuine stress  urinary incontinence as well as urge incontinence.   PHYSICAL EXAMINATION:  She is found to be in no acute distress.  Blood  pressure _________ pulse 91, weight 213 pounds.  HEENT:  Negative.  No adenopathy.  NECK:  Supple.  Thyroid nonpalpable.  LUNGS:  Clear.  CARDIOVASCULAR:  Regular rate and rhythm.  ABDOMEN:  Soft and nontender.  No  surgical scars are identified other than  prior appendectomy.  EXTREMITIES:  Normal.  PELVIC:  Normal external genitalia.  There is a bulge present at the  introitus which upon examination is noted to be anterior bulge consistent  with a large cystocele.  Smaller __________ urethral detachment is also  identified.  There is a mild rectocele present.  Examination of the cervix  revealed multiparous appearing cervix with some moderate descensus with the  uterosacral ligaments descending to the hymenal ring.  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   ASSESSMENT:  The patient with symptomatic pelvic prolapse with significant  uterine descensus condition.  She is noted to have a large cystocele with  evidence of genuine stress urinary incontinence.  Likewise the mild  rectocele which she does have resultant constipation with collection of  stool near the introitus.  She is currently having problems with irregular  menstrual periods whereas she had previously done well on the NuvaRing, she  began developing abnormal uterine bleeding, not due to abstinence.  She has  discontinued the NuvaRing.  The patient has been seen in consultation by Dr.  _________ who agrees that patient would benefit from bladder neck suspension  as well as anterior repair which has been scheduled for date of procedure at  06/13/04 at 07:30.  Did make patient aware that there was a postoperative  risk of dyspareunia with shortening of the vagina with the anterior and  posterior repair being performed.  In addition, risks associated with the  anesthesia and risks of transfusion being fairly minimal.   PLAN:  Proceed with vaginal hysterectomy, anterior and posterior repair and  TBT by Dr. ___________.  Problematic is dated 06/12/04.  The patient is noted to have a potassium of 3.0.  I have notified her  as soon as I could in the  early afternoon and actually put her K tabs 20 mEq p.o. b.i.d.  Advised her  to take two dosages this afternoon.  Electrolytes can be reassessed again  surgery 06/13/04.       DC/MEDQ  D:  06/12/2004  T:  06/13/2004  Job:  [id number]

## 2010-06-01 NOTE — Op Note (Signed)
[name], [name] NO.:  [id number]   MEDICAL RECORD NO.:  [id number]                  PATIENT TYPE:   LOCATION:                                       FACILITY:   PHYSICIAN:  Langley Gauss, M.D.                DATE OF BIRTH:   DATE OF PROCEDURE:  07/14/2003  DATE OF DISCHARGE:                                  PROCEDURE NOTE   OFFICE PROGRESS NOTE   The patient is on the magnesium sulfate at 3 grams per hour.  She is  complaining of chest pain and some difficulty breathing.  On examination she  appears to be in mild distress, obviously quite anxious.  Deep tendon  reflexes at the left knee are noted to be 2+.  Thus, at this point in time  with the intolerance of the IV magnesium sulfate the patient will be  immediately switched to 1 gm per hour and this will be stopped 1 hour from  now.  The patient subsequently to be managed expectantly. She should also be  receiving the IM betamethasone to enhance fetal lung maturity.      ___________________________________________                                            Langley Gauss, M.D.   DC/MEDQ  D:  07/14/2003  T:  07/14/2003  Job:  [id number]

## 2010-06-01 NOTE — Op Note (Signed)
NAME:  [name], [name]                           ACCOUNT NO.:  [id number]   MEDICAL RECORD NO.:  [id number]                   PATIENT TYPE:  INP   LOCATION:  A427                                 FACILITY:  [facility]   PHYSICIAN:  Langley Gauss, M.D.                DATE OF BIRTH:  July 19, 1975   DATE OF PROCEDURE:  07/22/2003  DATE OF DISCHARGE:                                 OPERATIVE REPORT   PROCEDURE:  Continuous lumbar epidural analgesia.   SUMMARY:  Informed consent obtained.  The patient had epidural placement for  two prior labor and deliveries.  The patient was placed in the seated  position, bony landmarks were identified.  Continuous electronic fetal  monitoring was performed.  The L3-L4 interspace was chosen.  The patient's  back was sterilely prepped and draped utilizing the epidural tray.  Lidocaine 5 mL 1% plain injected at the midline at the L3-4 interspace to  raise a small skin weal.  The 17-gauge Tuohy-Schlif needle was then utilized  for loss-of-resistance.  An air-filled glass syringe to identify entry into  the epidural space on the first attempt without difficulty.  No signs of CSF  or intravascular injection obtained.  Test dose administered through the  epidural needle, 5 mL 1.5% lidocaine with epinephrine.  No signs of CSF or  intravascular injection obtained.  The epidural catheter was then inserted  to a depth of 5 cm.  Epidural needle was removed.  Aspiration test is  negative.  Epidural catheter is incised, secured to the back.  Second test  dose of 2 mL 1.5% lidocaine with epinephrine injected through the epidural  catheter.  Again no signs of CSF return or intravascular injection obtained.  The patient was then connected to the infusion pump with a standard mixer.  She will be treated with a bolus of 10 mL followed by continuous infusion  rate of 14 mL per hour.  The patient does have early evidence of a bilateral  block in place.  She continues to have a  reassuring fetal heart rate.      ___________________________________________                                            Langley Gauss, M.D.   DC/MEDQ  D:  07/22/2003  T:  07/23/2003  Job:  [id number]

## 2010-06-01 NOTE — Op Note (Signed)
NAMEKAMYIAH, COLANTONIO NO.:  [id number]   MEDICAL RECORD NO.:  [id number]                  PATIENT TYPE:   LOCATION:                                       FACILITY:   PHYSICIAN:  Langley Gauss, M.D.                DATE OF BIRTH:   DATE OF PROCEDURE:  07/21/2003  DATE OF DISCHARGE:                                 OPERATIVE REPORT   DELIVERY NOTE   DATE OF DELIVERY:  07/22/2003   PROCEDURES PERFORMED:  On 07/22/2003: Amniotomy; placement of continuous  lumbar epidural analgesia, placed and managed by Dr. Roylene Reason. Lisette Grinder;  spontaneous assisted vaginal delivery of a 6 pound 6 ounce female infant  delivered over an intact perineum.  Delivery performed by Dr. Roylene Reason.  Lisette Grinder.   ESTIMATED BLOOD LOSS:  Less than 500 cc.   SPECIMENS:  1. Arterial cord gas and cord blood to laboratory.  2. The placenta is examined and noted to be apparently intact with a 3-     vessel umbilical cord.   FINDINGS AT THE TIME OF DELIVERY:  A nuchal cord, x1 which was reduced prior  to delivery of the shoulders.  This did result in some transient compression  during the second stage of labor.   SUMMARY:  The patient is a gravida 4, para 2 at [redacted] weeks gestation who  presented to [facility] the p.m. of 07/21/2003 complaining of onset  of regular uterine contractions.  She had previously been admitted 1 week  previously with episode of active preterm labor requiring tocolysis with IV  magnesium sulfate.  She also received 2 doses of IM betamethasone at that  time.  The patient's husband is stationed in [place], [place], active duty  [employer]el manager.  He had made plans to come to [place] and the family were  planning on traveling back to [place] on 07/23/2003; however, with  the onset of recurrent preterm labor the patient presented to [facility] the p.m. of 07/21/2003 at which time she was noted to be actively  contracting q.2-3 minutes and noted to  have some cervical change per nursing  evaluation.  The patient was admitted, at that time, at [redacted] weeks gestation.  The risks of any tocolytic drugs outweighed any benefits, thus she was not  tocolysed; IV fluids were started.   She was monitored throughout the evening and had episodic increase and  decreased episodes of uterine activity, but then on 07/22/2003 she again had  increasing frequency and intensity of uterine contractions.  The patient was  examined in the early afternoon at which time she was noted to have had  significant cervical change to 3+ cm dilated with bulging intact membranes.  She was also receiving IV ampicillin, at that time; thus, at that point in  time, amniotomy was performed with the finding that she would be delivered  prematurely.  The patient did require some Pitocin augmentation for inadequate frequency  and intensity of uterine contractions.  With onset of active labor with  discomfort associated with uterine contractions, the patient had epidural  analgesic placed.  Subsequently, thereafter, she progressed rapidly along  the labor curve to complete dilatation.  She had a strong urge to push, but  she was well controlled by the nursing staff.   When I arrived the patient had not had any expulsive efforts.  She was  completely dilated with the vertex at a +2 station. She was placed in the  dorsal lithotomy position; prepped and draped in the usual sterile manner.  She pushed twice during this very short second stage of labor to delivery of  the vertex in a direct OA position over the intact perineum. The mouth and  nares were bulb suctioned of clear amniotic fluid.  Nuchal cord x1 is  reduced.  Spontaneous rotation occurred to a right anterior shoulder  position.  Downward traction plus expulsive efforts resulted in delivery of  this shoulder as well as the remainder of the infant without difficulty.  The umbilical cord was milked towards the infant.   Cord was doubly clamped  and cut and infant is briefly placed on the maternal abdomen for bonding  purposes.  Arterial cord gas and cord blood are then obtained from the  umbilical cord.  Gentle traction on the umbilical cord results in  separation; which, upon examination, is noted to be an intact placenta with  associated 3-vessel umbilical cord.   Examination of the genital tract reveals the perineum to be intact.  No  lacerations are noted to have occurred.  Excellent uterine tone is achieved  with IV Pitocin solution.  The patient is, thus, taken out of dorsal  lithotomy position and rolled to her side at which time the epidural  catheter is removed, by Dr. Lisette Grinder, with the blue tip noted to be intact.      ___________________________________________                                            Langley Gauss, M.D.   DC/MEDQ  D:  07/24/2003  T:  07/24/2003  Job:  [id number]

## 2010-06-01 NOTE — Op Note (Signed)
NAMEDAWANA, ASPER NO.:  [id number]   MEDICAL RECORD NO.:  [id number]          PATIENT TYPE:  INP   LOCATION:  A428                          FACILITY:  [facility]   PHYSICIAN:  Langley Gauss, MD     DATE OF BIRTH:  02/03/1975   DATE OF PROCEDURE:  06/20/2004  DATE OF DISCHARGE:                                 OPERATIVE REPORT   PREOPERATIVE DIAGNOSES:  1.  Irregular menses.  2.  Genuine stress urinary continence with cystocele.  3.  Rectocele.   PROCEDURES PERFORMED:  1.  Vaginal hysterectomy.  2.  TVT procedure performed by Dr. Rito Ehrlich.  3.  Posterior repair by Dr. Lisette Grinder.   ESTIMATED BLOOD LOSS:  400 mL.   ANESTHESIA:  General endotracheal.   SPECIMENS:  For permanent section only.   COMPLICATIONS:  None.   DRAINS:  Foley catheter left straight drainage finding of procedure.  In  addition, packing is placed in the vaginal vault.   SUMMARY:  Vital signs stable.  The patient underwent uncomplicated induction  of general anesthesia.  She was then placed in the candy-cane stirrups,  prepped and draped in the usual sterile manner.  Dr. Dennie Maizes first  performed the TVT procedure, leaving the case in place after utilizing the  trocars.  I then proceeded with the vaginal hysterectomy.  This was  complicated by somewhat of a narrow pelvic introitus.  In addition, there  was only moderate descensus of the uterus with the uterosacral ligaments  descending the hymenal ring.  The anterior and posterior lip of the cervix  grasped with thyroid tenaculum.  A total of about 20 mL of bupivacaine with  epinephrine is injected circumferentially around the cervix.  The cul-de-sac  is atraumatically entered.  Peritoneal fluid is noted.  Each of the  uterosacral ligaments  is then sequentially clamped with Zeppelin clamps,  followed by suture ligature of 0 Vicryl in a Heaney fashion.  These were  tagged for later inspection.  Uterus retracted posteriorly.  I was  able to  identify the bladder reflection anterior.  A sharp knife is used to incise  the vaginal mucosa just distal to the bladder reflection.  This allows some  mobilization of the bladder out of the operative field and atraumatic injury  into the anterior cul-de-sac.  Each of the uterine ligaments was then  sequentially clamped with curved Zeppelin clamps, followed by suture  ligature of 0 Vicryl in a Heaney fashion to secure hemostasis.  This then  brought me up to the level of the round ligament and utero-ovarian  ligaments.  A curved Zeppelin clamp placed on each of these, followed by  suture ligature of 0 Vicryl in a Heaney fashion for the round ligaments, the  utero-ovarian ligaments  then clamped with curved Zeppelin clamp, followed by suture ligature of 0  Vicryl in Heaney fashion, followed by a free tie of 0 Vicryl, followed by  flashing.  Free ties are then tagged for later inspection.   Dictation ended at this point.       DC/MEDQ  D:  06/20/2004  T:  06/21/2004  Job:  [id number]

## 2010-06-01 NOTE — Discharge Summary (Signed)
NAME:  [name], [name]                           ACCOUNT NO.:  [id number]   MEDICAL RECORD NO.:  [id number]                   PATIENT TYPE:  INP   LOCATION:  A427                                 FACILITY:  [facility]   PHYSICIAN:  Langley Gauss, M.D.                DATE OF BIRTH:  12/18/1975   DATE OF ADMISSION:  07/21/2003  DATE OF DISCHARGE:  07/25/2003                                 DISCHARGE SUMMARY   PROCEDURES PERFORMED:  1. July 22, 2003 - Amniotomy.  2. July 22, 2003 - Placement of continuous lumbar epidural analgesia; start     time epidural placement per Dr. Roylene Reason. Carlson at 1700; epidural     discontinued following delivery at 2330.  3. July 22, 2003 - Spontaneous assisted vaginal delivery of a viable,     vigorous female infant delivered over an intact perineum.  4. On July 25, 2003, infant circumcision performed utilizing Mogen clamp.   DISPOSITION:  The patient is breast-feeding at the time of discharge.  She  will follow-up in four to six weeks' time, either here or back [place]  She is given a copy of standardized discharge  instructions.  Her husband is planning on having a vasectomy for birth  control purposes.   PERTINENT LABORATORY STUDIES:  RPR is nonreactive.  A+ blood type.  Admission hemoglobin and hematocrit 11.4/33.1 with a white count of 11.1.  Postpartum day number one hemoglobin 11.2, hematocrit 32.0 with a white  count of 11.3.   HOSPITAL COURSE:  See previous dictations.   The patient was admitted on July 21, 2003 at 41 weeks' gestation in active  preterm labor.  However, subsequently she had decreased frequency in the  intensity of uterine contractions and then episodically had increased  uterine contraction frequency and intensity.  It was not until she had  documented cervical change by serial exams that she was determined to be in  active preterm labor and it was not until that point and time that preterm  delivery was noted to be  inevitable and amniotomy was performed.  Subsequently, the patient delivered on July 22, 2003.  Postpartum, the  patient did very well.  The infant initially had some difficulty with breast-  feeding and the infant was watched carefully due to the prematurity of 36  weeks' gestation.  The patient subsequently did do well postpartum.  She was  able to bond well with the infant, such that on July 25, 2003, both mother  and infant were discharged to home.  No prescription medications required.     ___________________________________________                                         Langley Gauss, M.D.   DC/MEDQ  D:  07/25/2003  T:  07/25/2003  Job:  [id number]

## 2010-10-13 ENCOUNTER — Emergency Department (HOSPITAL_COMMUNITY)
Admission: EM | Admit: 2010-10-13 | Discharge: 2010-10-13 | Disposition: A | Discharge status: Home / Self Care | Payer: Managed Care, Other (non HMO) | Attending: Emergency Medicine | Admitting: Emergency Medicine

## 2010-10-13 ENCOUNTER — Emergency Department (HOSPITAL_COMMUNITY): Payer: Managed Care, Other (non HMO)

## 2010-10-13 ENCOUNTER — Encounter: Payer: Self-pay | Admitting: *Deleted

## 2010-10-13 DIAGNOSIS — W108XXA Fall (on) (from) other stairs and steps, initial encounter: Secondary | ICD-10-CM | POA: Insufficient documentation

## 2010-10-13 DIAGNOSIS — S82843A Displaced bimalleolar fracture of unspecified lower leg, initial encounter for closed fracture: Secondary | ICD-10-CM | POA: Insufficient documentation

## 2010-10-13 HISTORY — DX: Hypothyroidism, unspecified: E03.9

## 2010-10-13 HISTORY — DX: Anxiety disorder, unspecified: F41.9

## 2010-10-13 HISTORY — DX: Depression, unspecified: F32.A

## 2010-10-13 HISTORY — DX: Other specified postprocedural states: Z98.890

## 2010-10-13 HISTORY — DX: Major depressive disorder, single episode, unspecified: F32.9

## 2010-10-13 MED ORDER — CEFAZOLIN SODIUM 1-5 GM-% IV SOLN
1.0000 g | Freq: Once | INTRAVENOUS | Status: AC
  Administered 2010-10-13: 1 g via INTRAVENOUS
  Filled 2010-10-13: qty 50

## 2010-10-13 MED ORDER — OXYCODONE-ACETAMINOPHEN 5-325 MG PO TABS: 2.0000 | ORAL_TABLET | ORAL | Status: AC | PRN

## 2010-10-13 MED ORDER — SODIUM CHLORIDE 0.9 % IV BOLUS (SEPSIS)
1000.0000 mL | Freq: Once | INTRAVENOUS | Status: AC
  Administered 2010-10-13: 1000 mL via INTRAVENOUS

## 2010-10-13 MED ORDER — HYDROMORPHONE HCL 1 MG/ML IJ SOLN
1.0000 mg | Freq: Once | INTRAMUSCULAR | Status: AC
  Administered 2010-10-13: 1 mg via INTRAVENOUS

## 2010-10-13 MED ORDER — TETANUS-DIPHTH-ACELL PERTUSSIS 5-2.5-18.5 LF-MCG/0.5 IM SUSP
0.5000 mL | Freq: Once | INTRAMUSCULAR | Status: AC
  Administered 2010-10-13: 0.5 mL via INTRAMUSCULAR
  Filled 2010-10-13: qty 0.5

## 2010-10-13 MED ORDER — ONDANSETRON HCL 4 MG/2ML IJ SOLN
4.0000 mg | Freq: Once | INTRAMUSCULAR | Status: AC
  Administered 2010-10-13: 4 mg via INTRAVENOUS
  Filled 2010-10-13: qty 2

## 2010-10-13 MED ORDER — HYDROMORPHONE HCL 1 MG/ML IJ SOLN
1.0000 mg | Freq: Once | INTRAMUSCULAR | Status: AC
  Administered 2010-10-13: 1 mg via INTRAVENOUS
  Filled 2010-10-13: qty 1

## 2010-10-13 MED ORDER — ONDANSETRON 8 MG PO TBDP: 8.0000 mg | ORAL_TABLET | Freq: Three times a day (TID) | ORAL | Status: AC | PRN

## 2010-10-13 MED ORDER — HYDROMORPHONE HCL 1 MG/ML IJ SOLN
INTRAMUSCULAR | Status: AC
  Filled 2010-10-13: qty 1

## 2010-10-13 MED ORDER — ONDANSETRON 8 MG PO TBDP
8.0000 mg | ORAL_TABLET | Freq: Once | ORAL | Status: AC
  Administered 2010-10-13: 8 mg via ORAL
  Filled 2010-10-13: qty 1

## 2010-10-13 NOTE — ED Notes (Signed)
Pt back to room 3 after being wheeled out to vehicle where she became dizzy, nauseated and pale.

## 2010-10-13 NOTE — ED Notes (Signed)
Assisted RN in splinting pt's foot. foot was splinted with no difficulty. Pt was given a reusable ice-pack. Family at bedside. NAD noted at this time.

## 2010-10-13 NOTE — ED Notes (Signed)
Right pedal pulse auscultated by doppler prior to transporting to xray.

## 2010-10-13 NOTE — ED Notes (Signed)
Fell while carrying flowers down steps. Pt states she felt right lower leg pop and now feels a grinding sensation. Pt had total of Morphine 6mg   IV en route by EMS. Right lower leg splinted for comfort.

## 2010-10-13 NOTE — ED Notes (Signed)
Pt was offered crutches. Pt stated she had crutches at home and she did not need them. RN made aware. NAD noted at this time.

## 2010-10-13 NOTE — ED Notes (Signed)
Pt states she is feeling less nauseated. PT stating she wants to try Sprite.  Sprite taken to bedside. Pt instructed to take small sips when she feels like she is able to drink.

## 2010-10-13 NOTE — ED Notes (Signed)
Pt was given a sprite and saltine crackers. Pt has ate the crackers with no complication. Pt states she feels better. Pt's skin tone is back to normal. Family at bedside. NAD noted at this time.

## 2010-10-13 NOTE — ED Provider Notes (Signed)
History  Scribed for Dr. Bebe Shaggy, the patient was seen in room APA03. The chart was scribed by Gilman Schmidt. The patients care was started at 11:37. CSN: [id number] Arrival date & time: 10/13/2010 11:33 AM  Chief Complaint  Patient presents with  . Leg Injury   HPI  [name] is a 35 y.o. female who presents to the Emergency Department complaining of right leg injury. Pt reports that she was carrying a large bucket of flowers down steps when she slipped and fell (down three steps)~30 min ago. States she felt right lower leg pop and now feels grinding sensation. Reports pain in right ankle. Denies hitting head/syncope. Denies neck pain, back pain, hip pain, knee pain, or dizziness. Pt was given meds en route by EMS. There are no other associated symptoms and no other alleviating or aggravating factors.    HPI ELEMENTS:  Location: right ankle Onset: ~30 min ago Duration: persistent since onset  Timing: constant  Quality: grinding sensation  Context: as above  Associated symptoms: denies neck pain, back pain, hip pain, knee pain, or dizziness   PAST MEDICAL HISTORY:  History reviewed. No pertinent past medical history.   PAST SURGICAL HISTORY:  No past surgical history on file.   MEDICATIONS:  Previous Medications   No medications on file     ALLERGIES:  Allergies as of 10/13/2010  . (Not on File)     FAMILY HISTORY:  No family history on file.   SOCIAL HISTORY: History  Substance Use Topics  . Smoking status: Not on file  . Smokeless tobacco: Not on file  . Alcohol Use: Not on file     Review of Systems  Musculoskeletal: Negative for back pain.       Ankle pain  Denies hip pain, knee pain, or neck pain   All other systems reviewed and are negative.    Allergies  Review of patient's allergies indicates not on file.  Home Medications  No current outpatient prescriptions on file.  BP 102/57  Pulse 88  Temp(Src) 98.3 F (36.8 C) (Oral)  Resp 18  Ht 5'  4" (1.626 m)  Wt 280 lb (127.007 kg)  BMI 48.06 kg/m2  SpO2 100%  Physical Exam CONSTITUTIONAL: Well developed/well nourished HEAD AND FACE: Normocephalic/atraumatic EYES: EOMI/PERRL ENMT: Mucous membranes moist NECK: supple no meningeal signs SPINE:entire spine nontender CV: S1/S2 noted, no murmurs/rubs/gallops noted LUNGS: Lungs are clear to auscultation bilaterally, no apparent distress ABDOMEN: soft, nontender, no rebound or guarding GU:no cva tenderness NEURO: Pt is awake/alert, moves all extremitiesx4 EXTREMITIES: pulses normal by doppler, full ROM, foot is non tender and warm to touch SKIN: warm, color normal, abraision on medial part of right shin w/ tenderness and swelling  No right patellar/prox fib tenderness PSYCH: no abnormalities of mood noted   ED Course  Procedures (including critical care time)  OTHER DATA REVIEWED: Nursing notes, vital signs, and past medical records reviewed.  DIAGNOSTIC STUDIES: Oxygen Saturation is 100% on room air, normal by my interpretation.     RADIOLOGY:  DG Tibia/Fibula 2 View. Reviewed by me.  IMPRESSION: Bimalleolar ankle fractures. Recommend dedicated ankle radiographs. Original Report Authenticated By: P. Loralie Champagne, M.D.  DG Ankle 2 View. IMPRESSION: Bimalleolar fractures. Original Report Authenticated By: P. Loralie Champagne, M.D.   ED COURSE / COORDINATION OF CARE: 1137: - Patient evaluated by ED physician, DG Tib/Fib ordered 1:25 PM Discussed with Dr. Thurston Hole at patient's request He doubts  Open fracture Requests Splint and repeat x-rays 1:56  PM Dr Thurston Hole saw [name], feels she is stable for d/c and f/u in two days Will monitor patient as she is having nausea since receiving pain meds She understands plan to f/u Discussed strict return precaution Distal neurovascular intact on right foot, splint applied by nurse   PLAN:  Home Narcotic pain medication The patient is to return the emergency department if  there is any worsening of symptoms. I have reviewed the discharge instructions with the patient/family    SCRIBE ATTESTATION: I personally performed the services described in this documentation, which was scribed in my presence. The recorded information has been reviewed and considered. [name], MD           [name], MD 10/13/10 [phone]

## 2010-10-13 NOTE — ED Notes (Signed)
Pushed pt out to her parent's car to go home. Pt became very sick. She became extremely dizzy and weak. Pt was very pale and sweaty. Dr. Bebe Shaggy made aware. Pt was brought back into the ER and reassessed by RN. Pt sitting on the side of bed. RN and husband at bedside with pt. NAD noted at this time.

## 2010-10-16 LAB — POCT I-STAT, CHEM 8
BUN: 13
Calcium, Ion: 1.23
Chloride: 102
Creatinine, Ser: 0.9
Glucose, Bld: 97
HCT: 42
Hemoglobin: 14.3
Potassium: 4.3
Sodium: 142
TCO2: 30

## 2010-10-26 LAB — URINALYSIS, ROUTINE W REFLEX MICROSCOPIC
Bilirubin Urine: NEGATIVE
Glucose, UA: NEGATIVE
Hgb urine dipstick: NEGATIVE
Specific Gravity, Urine: 1.025

## 2010-10-26 LAB — SEDIMENTATION RATE: Sed Rate: 19

## 2011-06-11 ENCOUNTER — Encounter (HOSPITAL_COMMUNITY): Payer: Self-pay | Admitting: *Deleted

## 2011-06-11 ENCOUNTER — Emergency Department (HOSPITAL_COMMUNITY)
Admission: EM | Admit: 2011-06-11 | Discharge: 2011-06-12 | Disposition: A | Discharge status: Home / Self Care | Payer: Managed Care, Other (non HMO) | Attending: Emergency Medicine | Admitting: Emergency Medicine

## 2011-06-11 DIAGNOSIS — Z79899 Other long term (current) drug therapy: Secondary | ICD-10-CM | POA: Insufficient documentation

## 2011-06-11 DIAGNOSIS — R110 Nausea: Secondary | ICD-10-CM | POA: Insufficient documentation

## 2011-06-11 DIAGNOSIS — F341 Dysthymic disorder: Secondary | ICD-10-CM | POA: Insufficient documentation

## 2011-06-11 DIAGNOSIS — E039 Hypothyroidism, unspecified: Secondary | ICD-10-CM | POA: Insufficient documentation

## 2011-06-11 DIAGNOSIS — G43509 Persistent migraine aura without cerebral infarction, not intractable, without status migrainosus: Secondary | ICD-10-CM

## 2011-06-11 DIAGNOSIS — H538 Other visual disturbances: Secondary | ICD-10-CM | POA: Insufficient documentation

## 2011-06-11 MED ORDER — HYDROMORPHONE HCL PF 1 MG/ML IJ SOLN: 1.0000 mg | Freq: Once | INTRAMUSCULAR | Status: DC

## 2011-06-11 MED ORDER — ONDANSETRON HCL 4 MG/2ML IJ SOLN: 4.0000 mg | Freq: Once | INTRAMUSCULAR | Status: DC

## 2011-06-11 MED ORDER — KETOROLAC TROMETHAMINE 30 MG/ML IJ SOLN: 30.0000 mg | Freq: Once | INTRAMUSCULAR | Status: DC

## 2011-06-11 MED ORDER — DIPHENHYDRAMINE HCL 50 MG/ML IJ SOLN: 25.0000 mg | Freq: Once | INTRAMUSCULAR | Status: DC

## 2011-06-11 MED ORDER — SODIUM CHLORIDE 0.9 % IV SOLN: Freq: Once | INTRAVENOUS | Status: DC

## 2011-06-11 NOTE — ED Notes (Signed)
Headache,  Noticed rt eye dilated 1 hour ago.  Had  Eye dilated once before.  Nausea, Has hx of migraines , but this feels different. Vision change to Rt eye also.

## 2011-06-11 NOTE — ED Provider Notes (Signed)
History   This chart was scribed for [employer]. Colon Branch, MD by Brooks Sailors. The patient was seen in room APA11/APA11. Patient's care was started at 2255.   CSN: [id number]  Arrival date & time 06/11/11  2255   First MD Initiated Contact with Patient 06/11/11 2313      Chief Complaint  Patient presents with  . Headache    (Consider location/radiation/quality/duration/timing/severity/associated sxs/prior treatment) HPI  [name] is a 36 y.o. female who presents to the Emergency Department complaining of constant moderate headache onset today. Pt says the headache does not feel her usual migraines. Headache started in the temples and moved to the back of the head. Associated sxs nausea. Pt has taken Aleve all day with minimal relief. Pt says her vision is blurry, pupils unequal.   PCP Dr. Phillips Odor.   Past Medical History  Diagnosis Date  . Hypothyroidism   . Depression   . Anxiety   . History of shoulder surgery     Past Surgical History  Procedure Date  . Appendectomy   . Abdominal hysterectomy   . Cholecystectomy   . Fracture surgery     History reviewed. No pertinent family history.  History  Substance Use Topics  . Smoking status: Never Smoker   . Smokeless tobacco: Not on file  . Alcohol Use: No    OB History    Grav Para Term Preterm Abortions TAB SAB Ect Mult Living                  Review of Systems  All other systems reviewed and are negative.    Allergies  Codeine  Home Medications   Current Outpatient Rx  Name Route Sig Dispense Refill  . DESVENLAFAXINE SUCCINATE ER 50 MG PO TB24 Oral Take 50 mg by mouth every evening.      Marland Kitchen LEVOTHYROXINE SODIUM 50 MCG PO TABS Oral Take 50 mcg by mouth daily.      Marland Kitchen LEVOTHROID PO Oral Take 1 tablet by mouth daily. Unknown strength     . ALKA-SELTZER PLUS SINUS PO Oral Take 2 tablets by mouth 2 (two) times daily as needed. For congestion     . PRESCRIPTION MEDICATION Oral Take 1 tablet by mouth daily.  Unknown name/strength for Antibiotic for Bronchitis/Pneumonia. Started on Monday 10/08/10 for 10 days. Dr. Phillips Odor prescribed. Not on file at [facility].       BP 123/82  Pulse 83  Temp(Src) 97.7 F (36.5 C) (Oral)  Resp 20  Ht 5\' 4"  (1.626 m)  Wt 280 lb (127.007 kg)  BMI 48.06 kg/m2  SpO2 100%  Physical Exam  Nursing note and vitals reviewed. Constitutional: She is oriented to person, place, and time. She appears well-developed and well-nourished. No distress.  HENT:  Head: Normocephalic and atraumatic.  Eyes:       Pupils Unequal, Left 4mm reactive, Right 2mm reactive.   Neck: Neck supple. No tracheal deviation present.  Cardiovascular: Normal rate.   Pulmonary/Chest: Effort normal. No respiratory distress.  Abdominal: Soft. She exhibits no distension.  Musculoskeletal: Normal range of motion. She exhibits no edema.  Neurological: She is alert and oriented to person, place, and time. No sensory deficit.  Skin: Skin is warm and dry.  Psychiatric: She has a normal mood and affect. Her behavior is normal.    ED Course  Procedures (including critical care time) DIAGNOSTIC STUDIES: Oxygen Saturation is 100% on room air, normal by my interpretation.    COORDINATION OF  CARE: 2348 Patients informed of current plan for treatment and evaluation and agrees with plan at this time.   Ct Head Wo Contrast  06/12/2011  *RADIOLOGY REPORT*  Clinical Data: Headache, dilated left pupil.  History of migraine and Bell's palsy.  CT HEAD WITHOUT CONTRAST  Technique:  Contiguous axial images were obtained from the base of the skull through the vertex without contrast.  Comparison: CT head 10/29/2007, MRI brain 09/22/2006  Findings: Cavum septum pellucidum.  Ventricles and sulci are otherwise symmetrical.  No mass effect or midline shift.  No abnormal extra-axial fluid collections.  Gray-white matter junctions are distinct.  Basal cisterns are not effaced.  No evidence of acute intracranial  hemorrhage.  Visualized paranasal sinuses and mastoid air cells are not opacified.  No depressed skull fractures.  Stable appearance since previous study.  IMPRESSION: No acute intracranial abnormalities.  Persistent cavum septum pellucidum.  Original Report Authenticated By: Marlon Pel, M.D.    0230 Patient has rested without headache. Pupils are both 3 mm and equally reactive.    MDM  Patient presents with history of a headache that has lasted most of the day. Headache improved and she noticed that her right eye was dilated. Physical exam with unequal pupils are both reactive. CT scan negative for any acute process. Without intervention pupils became equal and are equally reactive. Suggestive of migraine headache with vision changes.Pt stable in ED with no significant deterioration in condition.The patient appears reasonably screened and/or stabilized for discharge and I doubt any other medical condition or other [facility] requiring further screening, evaluation, or treatment in the ED at this time prior to discharge.  I personally performed the services described in this documentation, which was scribed in my presence. The recorded information has been reviewed and considered.   MDM Reviewed: nursing note and vitals Interpretation: CT scan           Nicoletta Dress. Colon Branch, MD 06/12/11 [phone]

## 2011-06-12 ENCOUNTER — Emergency Department (HOSPITAL_COMMUNITY): Payer: Managed Care, Other (non HMO)

## 2011-06-12 NOTE — Discharge Instructions (Signed)
Your CT was normal. The most likely cause of the unequal pupils is a migraine headache. It may prove to be one of your warning signals in the future. Followup with your doctor.   Migraine Headache A migraine is very bad pain on one or both sides of your head. The cause of a migraine is not always known. A migraine can be triggered or caused by different things, such as:  Alcohol.   Smoking.   Stress.   Periods (menstruation) in women.   Aged cheeses.   Foods or drinks that contain nitrates, glutamate, aspartame, or tyramine.   Lack of sleep.   Chocolate.   Caffeine.   Hunger.   Medicines, such as nitroglycerine (used to treat chest pain), birth control pills, estrogen, and some blood pressure medicines.  HOME CARE  Many medicines can help migraine pain or keep migraines from coming back. Your doctor can help you decide on a medicine or treatment program.   If you or your child gets a migraine, it may help to lie down in a dark, quiet room.   Keep a headache journal. This may help find out what is causing the headaches. For example, write down:   What you eat and drink.   How much sleep you get.   Any change to your diet or medicines.  GET HELP RIGHT AWAY IF:   The medicine does not work.   The pain begins again.   The neck is stiff.   You have trouble seeing.   The muscles are weak or you lose muscle control.   You have new symptoms.   You lose your balance.   You have trouble walking.   You feel faint or pass out.  MAKE SURE YOU:   Understand these instructions.   Will watch this condition.   Will get help right away if you are not doing well or get worse.  Document Released: 10/10/2007 Document Revised: 12/20/2010 Document Reviewed: 09/05/2008 [facility] Patient Information 2012 [place], [place].

## 2011-06-12 NOTE — ED Notes (Signed)
At present patient does not want IV or any medicine. MD aware

## 2012-05-29 ENCOUNTER — Other Ambulatory Visit: Payer: Self-pay | Admitting: Adult Health

## 2012-11-09 ENCOUNTER — Other Ambulatory Visit: Payer: Self-pay | Admitting: Adult Health

## 2013-04-06 ENCOUNTER — Ambulatory Visit (INDEPENDENT_AMBULATORY_CARE_PROVIDER_SITE_OTHER): Payer: Managed Care, Other (non HMO) | Admitting: Women's Health

## 2013-04-06 ENCOUNTER — Other Ambulatory Visit: Payer: Self-pay | Admitting: Women's Health

## 2013-04-06 ENCOUNTER — Encounter: Payer: Self-pay | Admitting: Women's Health

## 2013-04-06 ENCOUNTER — Encounter (INDEPENDENT_AMBULATORY_CARE_PROVIDER_SITE_OTHER): Payer: Self-pay

## 2013-04-06 VITALS — BP 106/80 | Ht 63.0 in | Wt 276.2 lb

## 2013-04-06 DIAGNOSIS — N6320 Unspecified lump in the left breast, unspecified quadrant: Secondary | ICD-10-CM

## 2013-04-06 DIAGNOSIS — N63 Unspecified lump in unspecified breast: Secondary | ICD-10-CM

## 2013-04-06 NOTE — Patient Instructions (Signed)
4/1 @ 1:30pm at [facility], be there at 1:15pm. No lotion, powder, deodorant.

## 2013-04-06 NOTE — Progress Notes (Signed)
Patient ID: [name], female   DOB: 1976/01/13, 38 y.o.   MRN: [id number]   [facility] Visit  Patient name: [name] MRN [id number]  Date of birth: 1975/03/21  CC & HPI:  [name] is a 38 y.o. Caucasian female presenting today for new non-tender Lt breast mass x 1.5-2 months, feels like it has gotten somewhat larger and changed texture- doesn't feel smooth to her. H/O breast cancer in maternal aunt and grandmother. Last mammo 63yrs ago, normal.   Pertinent History Reviewed:  Medical & Surgical Hx:   Past Medical History  Diagnosis Date  . Hypothyroidism   . Depression   . Anxiety   . History of shoulder surgery    Past Surgical History  Procedure Laterality Date  . Appendectomy    . Abdominal hysterectomy    . Cholecystectomy    . Fracture surgery    . Leg surgery Right   . Shoulder surgery Right    Medications: Reviewed & Updated - see associated section Social History: Reviewed -  reports that she has never smoked. She has never used smokeless tobacco.  Objective Findings:  Vitals: BP 106/80  Ht 5\' 3"  (1.6 m)  Wt 276 lb 3.2 oz (125.283 kg)  BMI 48.94 kg/m2  Physical Examination: General appearance - alert, well appearing, and in no distress  Breasts - right breast normal without mass, skin or nipple changes or axillary nodes,  Left breast: no dimpling, retracting, skin or nipple changes, no axillary nodes felt, no mass palpable while supine, pt states she can only feel in sitting/standing position. Sitting- small ~0.5-1cm nontender mass felt at 7 o'clock ~46fb from nipple, right near areolar line. Difficult to feel very well.   Assessment & Plan:  A:   Left breast mass  Family h/o breast CA P:  Diagnostic bilateral mammo 4/1 @ 1:30 at [facility], w/ u/s per radiologist if needed   F/U 4/13 as scheduled for physical   Tawnya Crook CNM, [facility] 04/06/2013 9:43 AM

## 2013-04-14 ENCOUNTER — Encounter: Payer: Self-pay | Admitting: Women's Health

## 2013-04-14 ENCOUNTER — Ambulatory Visit (HOSPITAL_COMMUNITY)
Admission: RE | Admit: 2013-04-14 | Discharge: 2013-04-14 | Disposition: A | Discharge status: Home / Self Care | Payer: Managed Care, Other (non HMO) | Source: Ambulatory Visit | Attending: Women's Health | Admitting: Women's Health

## 2013-04-14 ENCOUNTER — Other Ambulatory Visit: Payer: Self-pay | Admitting: Women's Health

## 2013-04-14 DIAGNOSIS — N63 Unspecified lump in unspecified breast: Secondary | ICD-10-CM | POA: Insufficient documentation

## 2013-04-14 DIAGNOSIS — N6320 Unspecified lump in the left breast, unspecified quadrant: Secondary | ICD-10-CM

## 2013-04-26 ENCOUNTER — Ambulatory Visit (INDEPENDENT_AMBULATORY_CARE_PROVIDER_SITE_OTHER): Payer: Managed Care, Other (non HMO) | Admitting: Adult Health

## 2013-04-26 ENCOUNTER — Encounter: Payer: Self-pay | Admitting: Adult Health

## 2013-04-26 VITALS — BP 110/78 | HR 74 | Ht 64.0 in | Wt 278.0 lb

## 2013-04-26 DIAGNOSIS — Z01419 Encounter for gynecological examination (general) (routine) without abnormal findings: Secondary | ICD-10-CM

## 2013-04-26 DIAGNOSIS — E039 Hypothyroidism, unspecified: Secondary | ICD-10-CM | POA: Insufficient documentation

## 2013-04-26 DIAGNOSIS — M722 Plantar fascial fibromatosis: Secondary | ICD-10-CM

## 2013-04-26 HISTORY — DX: Plantar fascial fibromatosis: M72.2

## 2013-04-26 NOTE — Progress Notes (Signed)
Patient ID: [name], female   DOB: 01/04/1976, 38 y.o.   MRN: [id number] History of Present Illness:  [name] is a 38 year old white female married in for a physical.She is a self Research officer, political party.  Current Medications, Allergies, Past Medical History, Past Surgical History, Family History and Social History were reviewed in [employer] record.   Past Medical History  Diagnosis Date  . Hypothyroidism   . Depression   . Anxiety   . History of shoulder surgery   . Obesity   . Hypothyroid 04/26/2013  . Plantar fasciitis 04/26/2013   Past Surgical History  Procedure Laterality Date  . Appendectomy    . Abdominal hysterectomy    . Cholecystectomy    . Fracture surgery    . Leg surgery Right   . Shoulder surgery Right   . Dilation and curettage of uterus    Current outpatient prescriptions:levothyroxine (SYNTHROID, LEVOTHROID) 50 MCG tablet, TAKE ONE TABLET BY MOUTH ONCE DAILY., Disp: 30 tablet, Rfl: 6;  PRISTIQ 100 MG 24 hr tablet, TAKE (1) TABLET BY MOUTH DAILY., Disp: 30 tablet, Rfl: 3 Review of Systems: Patient denies any headaches, blurred vision, shortness of breath, chest pain, abdominal pain, problems with bowel movements, urination, or intercourse. Moods good, she is com,plaining of pain in her feet.she has joined Education officer, environmental.    Physical Exam:BP 110/78  Pulse 74  Ht 5\' 4"  (1.626 m)  Wt 278 lb (126.1 kg)  BMI 47.70 kg/m2 General:  Well developed, well nourished, no acute distress Skin:  Warm and dry Neck:  Midline trachea, normal thyroid Lungs; Clear to auscultation bilaterally Breast:  No dominant palpable mass, retraction, or nipple discharge Cardiovascular: Regular rate and rhythm Abdomen:  Soft, non tender, no hepatosplenomegaly Pelvic:  External genitalia is normal in appearance.  The vagina is normal in appearance. The cervix and uterus are absent.  No adnexal masses or tenderness noted. Extremities:  No swelling or varicosities noted, has  tender point both heels Psych:  No mood changes, alert and cooperative, seems happy   Impression: Yearly gyn exam no pap hypothyroid Plantar fasciitis    Plan: Physical in 1 year TSH Try stretches for plantar fasciitis and review handout, use ice bottle roll and NSAIDS prn Continue meds

## 2013-04-26 NOTE — Patient Instructions (Signed)
Plantar Fasciitis Plantar fasciitis is a common condition that causes foot pain. It is soreness (inflammation) of the band of tough fibrous tissue on the bottom of the foot that runs from the heel bone (calcaneus) to the ball of the foot. The cause of this soreness may be from excessive standing, poor fitting shoes, running on hard surfaces, being overweight, having an abnormal walk, or overuse (this is common in runners) of the painful foot or feet. It is also common in aerobic exercise dancers and ballet dancers. SYMPTOMS  Most people with plantar fasciitis complain of:  Severe pain in the morning on the bottom of their foot especially when taking the first steps out of bed. This pain recedes after a few minutes of walking.  Severe pain is experienced also during walking following a long period of inactivity.  Pain is worse when walking barefoot or up stairs DIAGNOSIS   Your caregiver will diagnose this condition by examining and feeling your foot.  Special tests such as X-rays of your foot, are usually not needed. PREVENTION   Consult a sports medicine professional before beginning a new exercise program.  Walking programs offer a good workout. With walking there is a lower chance of overuse injuries common to runners. There is less impact and less jarring of the joints.  Begin all new exercise programs slowly. If problems or pain develop, decrease the amount of time or distance until you are at a comfortable level.  Wear good shoes and replace them regularly.  Stretch your foot and the heel cords at the back of the ankle (Achilles tendon) both before and after exercise.  Run or exercise on even surfaces that are not hard. For example, asphalt is better than pavement.  Do not run barefoot on hard surfaces.  If using a treadmill, vary the incline.  Do not continue to workout if you have foot or joint problems. Seek professional help if they do not improve. HOME CARE INSTRUCTIONS     Avoid activities that cause you pain until you recover.  Use ice or cold packs on the problem or painful areas after working out.  Only take over-the-counter or prescription medicines for pain, discomfort, or fever as directed by your caregiver.  Soft shoe inserts or athletic shoes with air or gel sole cushions may be helpful.  If problems continue or become more severe, consult a sports medicine caregiver or your own health care provider. Cortisone is a potent anti-inflammatory medication that may be injected into the painful area. You can discuss this treatment with your caregiver. MAKE SURE YOU:   Understand these instructions.  Will watch your condition.  Will get help right away if you are not doing well or get worse. Document Released: 09/25/2000 Document Revised: 03/25/2011 Document Reviewed: 11/25/2007 [facility] Patient Information 2014 [place], [place]. Physical in 1 year Mammogram at 83

## 2013-04-27 ENCOUNTER — Telehealth: Payer: Self-pay | Admitting: Adult Health

## 2013-04-27 LAB — TSH: TSH: 3.641 u[IU]/mL (ref 0.350–4.500)

## 2013-04-27 NOTE — Telephone Encounter (Signed)
Left message to call about TSH

## 2013-04-28 ENCOUNTER — Telehealth: Payer: Self-pay | Admitting: Adult Health

## 2013-04-28 ENCOUNTER — Telehealth: Payer: Self-pay | Admitting: *Deleted

## 2013-04-28 MED ORDER — LEVOTHYROXINE SODIUM 75 MCG PO TABS: 75.0000 ug | ORAL_TABLET | Freq: Every day | ORAL | Status: DC

## 2013-04-28 NOTE — Telephone Encounter (Signed)
Pt spoke with JAG about lab results

## 2013-04-28 NOTE — Telephone Encounter (Signed)
Pt aware TSH 3.641 will increase synthroid to 75 mcg and recheck labs in 8 weeks, pt in recall

## 2013-06-23 ENCOUNTER — Other Ambulatory Visit: Payer: Managed Care, Other (non HMO)

## 2013-09-23 ENCOUNTER — Other Ambulatory Visit: Payer: Self-pay | Admitting: Adult Health

## 2013-11-15 ENCOUNTER — Encounter: Payer: Self-pay | Admitting: Adult Health

## 2014-02-18 ENCOUNTER — Telehealth: Payer: Self-pay | Admitting: Adult Health

## 2014-02-18 ENCOUNTER — Ambulatory Visit (INDEPENDENT_AMBULATORY_CARE_PROVIDER_SITE_OTHER): Payer: Managed Care, Other (non HMO) | Admitting: Adult Health

## 2014-02-18 ENCOUNTER — Encounter: Payer: Self-pay | Admitting: Adult Health

## 2014-02-18 VITALS — BP 120/88 | Ht 64.0 in | Wt 274.5 lb

## 2014-02-18 DIAGNOSIS — F418 Other specified anxiety disorders: Secondary | ICD-10-CM | POA: Insufficient documentation

## 2014-02-18 DIAGNOSIS — F32A Depression, unspecified: Secondary | ICD-10-CM

## 2014-02-18 DIAGNOSIS — F329 Major depressive disorder, single episode, unspecified: Secondary | ICD-10-CM

## 2014-02-18 DIAGNOSIS — F410 Panic disorder [episodic paroxysmal anxiety] without agoraphobia: Secondary | ICD-10-CM | POA: Insufficient documentation

## 2014-02-18 MED ORDER — PAROXETINE HCL 20 MG PO TABS: 20.0000 mg | ORAL_TABLET | Freq: Every day | ORAL | Status: DC

## 2014-02-18 MED ORDER — LORAZEPAM 0.5 MG PO TABS: 0.5000 mg | ORAL_TABLET | Freq: Three times a day (TID) | ORAL | Status: DC

## 2014-02-18 NOTE — Patient Instructions (Signed)
Panic Attacks Panic attacks are sudden, short-livedsurges of severe anxiety, fear, or discomfort. They may occur for no reason when you are relaxed, when you are anxious, or when you are sleeping. Panic attacks may occur for a number of reasons:   Healthy people occasionally have panic attacks in extreme, life-threatening situations, such as war or natural disasters. Normal anxiety is a protective mechanism of the body that helps us react to danger (fight or flight response).  Panic attacks are often seen with anxiety disorders, such as panic disorder, social anxiety disorder, generalized anxiety disorder, and phobias. Anxiety disorders cause excessive or uncontrollable anxiety. They may interfere with your relationships or other life activities.  Panic attacks are sometimes seen with other mental illnesses, such as depression and posttraumatic stress disorder.  Certain medical conditions, prescription medicines, and drugs of abuse can cause panic attacks. SYMPTOMS  Panic attacks start suddenly, peak within 20 minutes, and are accompanied by four or more of the following symptoms:  Pounding heart or fast heart rate (palpitations).  Sweating.  Trembling or shaking.  Shortness of breath or feeling smothered.  Feeling choked.  Chest pain or discomfort.  Nausea or strange feeling in your stomach.  Dizziness, light-headedness, or feeling like you will faint.  Chills or hot flushes.  Numbness or tingling in your lips or hands and feet.  Feeling that things are not real or feeling that you are not yourself.  Fear of losing control or going crazy.  Fear of dying. Some of these symptoms can mimic serious medical conditions. For example, you may think you are having a heart attack. Although panic attacks can be very scary, they are not life threatening. DIAGNOSIS  Panic attacks are diagnosed through an assessment by your health care provider. Your health care provider will ask  questions about your symptoms, such as where and when they occurred. Your health care provider will also ask about your medical history and use of alcohol and drugs, including prescription medicines. Your health care provider may order blood tests or other studies to rule out a serious medical condition. Your health care provider may refer you to a mental health professional for further evaluation. TREATMENT   Most healthy people who have one or two panic attacks in an extreme, life-threatening situation will not require treatment.  The treatment for panic attacks associated with anxiety disorders or other mental illness typically involves counseling with a mental health professional, medicine, or a combination of both. Your health care provider will help determine what treatment is best for you.  Panic attacks due to physical illness usually go away with treatment of the illness. If prescription medicine is causing panic attacks, talk with your health care provider about stopping the medicine, decreasing the dose, or substituting another medicine.  Panic attacks due to alcohol or drug abuse go away with abstinence. Some adults need professional help in order to stop drinking or using drugs. HOME CARE INSTRUCTIONS   Take all medicines as directed by your health care provider.   Schedule and attend follow-up visits as directed by your health care provider. It is important to keep all your appointments. SEEK MEDICAL CARE IF:  You are not able to take your medicines as prescribed.  Your symptoms do not improve or get worse. SEEK IMMEDIATE MEDICAL CARE IF:   You experience panic attack symptoms that are different than your usual symptoms.  You have serious thoughts about hurting yourself or others.  You are taking medicine for panic attacks and   have a serious side effect. MAKE SURE YOU:  Understand these instructions.  Will watch your condition.  Will get help right away if you are not  doing well or get worse. Document Released: 12/31/2004 Document Revised: 01/05/2013 Document Reviewed: 08/14/2012 [facility] Patient Information 2015 [place], [place]. This information is not intended to replace advice given to you by your health care provider. Make sure you discuss any questions you have with your health care provider. Depression Depression refers to feeling sad, low, down in the dumps, blue, gloomy, or empty. In general, there are two kinds of depression:  Normal sadness or normal grief. This kind of depression is one that we all feel from time to time after upsetting life experiences, such as the loss of a job or the ending of a relationship. This kind of depression is considered normal, is short lived, and resolves within a few days to 2 weeks. Depression experienced after the loss of a loved one (bereavement) often lasts longer than 2 weeks but normally gets better with time.  Clinical depression. This kind of depression lasts longer than normal sadness or normal grief or interferes with your ability to function at home, at work, and in school. It also interferes with your personal relationships. It affects almost every aspect of your life. Clinical depression is an illness. Symptoms of depression can also be caused by conditions other than those mentioned above, such as:  Physical illness. Some physical illnesses, including underactive thyroid gland (hypothyroidism), severe anemia, specific types of cancer, diabetes, uncontrolled seizures, heart and lung problems, strokes, and chronic pain are commonly associated with symptoms of depression.  Side effects of some prescription medicine. In some people, certain types of medicine can cause symptoms of depression.  Substance abuse. Abuse of alcohol and illicit drugs can cause symptoms of depression. SYMPTOMS Symptoms of normal sadness and normal grief include the following:  Feeling sad or crying for short periods of time.  Not  caring about anything (apathy).  Difficulty sleeping or sleeping too much.  No longer able to enjoy the things you used to enjoy.  Desire to be by oneself all the time (social isolation).  Lack of energy or motivation.  Difficulty concentrating or remembering.  Change in appetite or weight.  Restlessness or agitation. Symptoms of clinical depression include the same symptoms of normal sadness or normal grief and also the following symptoms:  Feeling sad or crying all the time.  Feelings of guilt or worthlessness.  Feelings of hopelessness or helplessness.  Thoughts of suicide or the desire to harm yourself (suicidal ideation).  Loss of touch with reality (psychotic symptoms). Seeing or hearing things that are not real (hallucinations) or having false beliefs about your life or the people around you (delusions and paranoia). DIAGNOSIS  The diagnosis of clinical depression is usually based on how bad the symptoms are and how long they have lasted. Your health care provider will also ask you questions about your medical history and substance use to find out if physical illness, use of prescription medicine, or substance abuse is causing your depression. Your health care provider may also order blood tests. TREATMENT  Often, normal sadness and normal grief do not require treatment. However, sometimes antidepressant medicine is given for bereavement to ease the depressive symptoms until they resolve. The treatment for clinical depression depends on how bad the symptoms are but often includes antidepressant medicine, counseling with a mental health professional, or both. Your health care provider will help to determine what treatment  is best for you. Depression caused by physical illness usually goes away with appropriate medical treatment of the illness. If prescription medicine is causing depression, talk with your health care provider about stopping the medicine, decreasing the dose, or  changing to another medicine. Depression caused by the abuse of alcohol or illicit drugs goes away when you stop using these substances. Some adults need professional help in order to stop drinking or using drugs. SEEK IMMEDIATE MEDICAL CARE IF:  You have thoughts about hurting yourself or others.  You lose touch with reality (have psychotic symptoms).  You are taking medicine for depression and have a serious side effect. FOR MORE INFORMATION  National Alliance on Mental Illness: www.nami.[employer] of Mental Health: https://carter.com/ Document Released: 12/29/1999 Document Revised: 05/17/2013 Document Reviewed: 04/01/2011 [facility] Patient Information 2015 [place], [place]. This information is not intended to replace advice given to you by your health care provider. Make sure you discuss any questions you have with your health care provider. Start paxil Follow up in april

## 2014-02-18 NOTE — Telephone Encounter (Signed)
Pt texted Knute Neu and told her she could not tell me every at her visit was upset,and Maudie Mercury told me this.Called pt back and she says she not sure if will take paxil since it did not help that much years ago and may not fill ativan after reading side effects, thinks she needs ADD treated too, gave her number for Let[facility] Moynahan, and offered appt with Dr Elonda Husky on Monday, which she declined, I told her I did not feel I could treat all complaints, that a psychiatrist would be best, she said may call [facility] too.

## 2014-02-18 NOTE — Progress Notes (Signed)
Subjective:     Patient ID: [name], female   DOB: [birth year], 39 y.o.   MRN: [id number]  HPI [name] is a 39 year old white female, married in complaining of not being able to focus, feels jittery,depressed, can't get work done and is a self employed as Geophysicist/field seismologist.She denies suicidal ideations and she is not getting along with Mom well.Her son is ADHD and she thinks she may have some ADD.She was on pristiq and quit it back in September and then tried to restart and had migraines and felt bad.Has been on celexa and prozac in past and was on paxil before children.Is having panic attacks about once a week.She is teary today and says she does not cry.  Review of Systems + for depression and panic attacks, all others negative  Reviewed past medical,surgical, social and family history. Reviewed medications and allergies.     Objective:   Physical Exam BP 120/88 mmHg  Ht 5\' 4"  (1.626 m)  Wt 274 lb 8 oz (124.512 kg)  BMI 47.09 kg/m2   Discussed medications can help and I recommend counseling, and she thinks adderall wil help her,but I discussed this with Dr Elonda Husky and he suggests trying paxil first at 20 mg then increasing to 40 mg if needed.Will give ativan for panic attack.Discussed could take 2 weeks to get in system and 4-6 weeks to tell if helping. Spent 15 minutes  face to face talking with pt.  Assessment:     Depression Panic attacks    Plan:     Rx paxil 20 mg #30 1 daily with 3 refills Rx ativan 0.5 mg #30 1 every 8 hours prn panic attack Follow up in April as scheduled,call before if desires counseling or needs to talk Review handout on depression and panic attacks

## 2014-05-11 ENCOUNTER — Other Ambulatory Visit: Payer: Managed Care, Other (non HMO) | Admitting: Adult Health

## 2014-10-03 ENCOUNTER — Other Ambulatory Visit: Payer: Self-pay | Admitting: Adult Health

## 2015-06-30 ENCOUNTER — Encounter (HOSPITAL_COMMUNITY): Payer: Self-pay | Admitting: *Deleted

## 2015-06-30 ENCOUNTER — Ambulatory Visit (HOSPITAL_COMMUNITY)
Admission: EM | Admit: 2015-06-30 | Discharge: 2015-06-30 | Disposition: A | Discharge status: Home / Self Care | Payer: Managed Care, Other (non HMO) | Attending: Family Medicine | Admitting: Family Medicine

## 2015-06-30 DIAGNOSIS — M940 Chondrocostal junction syndrome [Tietze]: Secondary | ICD-10-CM

## 2015-06-30 DIAGNOSIS — M797 Fibromyalgia: Secondary | ICD-10-CM

## 2015-06-30 DIAGNOSIS — M609 Myositis, unspecified: Secondary | ICD-10-CM

## 2015-06-30 DIAGNOSIS — M7918 Myalgia, other site: Secondary | ICD-10-CM

## 2015-06-30 DIAGNOSIS — R0789 Other chest pain: Secondary | ICD-10-CM | POA: Diagnosis not present

## 2015-06-30 MED ORDER — DICLOFENAC SODIUM 1 % TD GEL: 1.0000 "application " | Freq: Four times a day (QID) | TRANSDERMAL | Status: DC

## 2015-06-30 NOTE — Discharge Instructions (Signed)
Use ice to the chest wall areas of pain. Use heat to muscles of the shoulders and back. Stretch the muscles as discussed and apply the Diclofenac gel, Salonpas (Menthol) may also help and heat on top of this.   Chest Wall Pain Chest wall pain is pain in or around the bones and muscles of your chest. Sometimes, an injury causes this pain. Sometimes, the cause may not be known. This pain may take several weeks or longer to get better. HOME CARE INSTRUCTIONS  Pay attention to any changes in your symptoms. Take these actions to help with your pain:   Rest as told by your health care provider.   Avoid activities that cause pain. These include any activities that use your chest muscles or your abdominal and side muscles to lift heavy items.   If directed, apply ice to the painful area:  Put ice in a plastic bag.  Place a towel between your skin and the bag.  Leave the ice on for 20 minutes, 2-3 times per day.  Take over-the-counter and prescription medicines only as told by your health care provider.  Do not use tobacco products, including cigarettes, chewing tobacco, and e-cigarettes. If you need help quitting, ask your health care provider.  Keep all follow-up visits as told by your health care provider. This is important. SEEK MEDICAL CARE IF:  You have a fever.  Your chest pain becomes worse.  You have new symptoms. SEEK IMMEDIATE MEDICAL CARE IF:  You have nausea or vomiting.  You feel sweaty or light-headed.  You have a cough with phlegm (sputum) or you cough up blood.  You develop shortness of breath.   This information is not intended to replace advice given to you by your health care provider. Make sure you discuss any questions you have with your health care provider.   Document Released: 12/31/2004 Document Revised: 09/21/2014 Document Reviewed: 03/28/2014 Elsevier Interactive Patient Education 2016 Elsevier Inc.  Costochondritis Costochondritis, sometimes  called Tietze syndrome, is a swelling and irritation (inflammation) of the tissue (cartilage) that connects your ribs with your breastbone (sternum). It causes pain in the chest and rib area. Costochondritis usually goes away on its own over time. It can take up to 6 weeks or longer to get better, especially if you are unable to limit your activities. CAUSES  Some cases of costochondritis have no known cause. Possible causes include:  Injury (trauma).  Exercise or activity such as lifting.  Severe coughing. SIGNS AND SYMPTOMS  Pain and tenderness in the chest and rib area.  Pain that gets worse when coughing or taking deep breaths.  Pain that gets worse with specific movements. DIAGNOSIS  Your health care provider will do a physical exam and ask about your symptoms. Chest X-rays or other tests may be done to rule out other problems. TREATMENT  Costochondritis usually goes away on its own over time. Your health care provider may prescribe medicine to help relieve pain. HOME CARE INSTRUCTIONS   Avoid exhausting physical activity. Try not to strain your ribs during normal activity. This would include any activities using chest, abdominal, and side muscles, especially if heavy weights are used.  Apply ice to the affected area for the first 2 days after the pain begins.  Put ice in a plastic bag.  Place a towel between your skin and the bag.  Leave the ice on for 20 minutes, 2-3 times a day.  Only take over-the-counter or prescription medicines as directed by your health  care provider. SEEK MEDICAL CARE IF:  You have redness or swelling at the rib joints. These are signs of infection.  Your pain does not go away despite rest or medicine. SEEK IMMEDIATE MEDICAL CARE IF:   Your pain increases or you are very uncomfortable.  You have shortness of breath or difficulty breathing.  You cough up blood.  You have worse chest pains, sweating, or vomiting.  You have a fever or  persistent symptoms for more than 2-3 days.  You have a fever and your symptoms suddenly get worse. MAKE SURE YOU:   Understand these instructions.  Will watch your condition.  Will get help right away if you are not doing well or get worse.   This information is not intended to replace advice given to you by your health care provider. Make sure you discuss any questions you have with your health care provider.   Document Released: 10/10/2004 Document Revised: 10/21/2012 Document Reviewed: 08/04/2012 Elsevier Interactive Patient Education 2016 Elsevier Inc.  Myofascial Pain Syndrome and Fibromyalgia There is no evidence of fibromyalgia. Ignore this part on these instructions. Myofascial pain syndrome and fibromyalgia are both pain disorders. This pain may be felt mainly in your muscles.   Myofascial pain syndrome:  Always has trigger points or tender points in the muscle that will cause pain when pressed. The pain may come and go.  Usually affects your neck, upper back, and shoulder areas. The pain often radiates into your arms and hands.  Fibromyalgia:  Has muscle pains and tenderness that come and go.  Is often associated with fatigue and sleep disturbances.  Has trigger points.  Tends to be long-lasting (chronic), but is not life-threatening. Fibromyalgia and myofascial pain are not the same. However, they often occur together. If you have both conditions, each can make the other worse. Both are common and can cause enough pain and fatigue to make day-to-day activities difficult.  CAUSES  The exact causes of fibromyalgia and myofascial pain are not known. People with certain gene types may be more likely to develop fibromyalgia. Some factors can be triggers for both conditions, such as:   Spine disorders.  Arthritis.  Severe injury (trauma) and other physical stressors.  Being under a lot of stress.  A medical illness. SIGNS AND SYMPTOMS  Fibromyalgia The main  symptom of fibromyalgia is widespread pain and tenderness in your muscles. This can vary over time. Pain is sometimes described as stabbing, shooting, or burning. You may have tingling or numbness, too. You may also have sleep problems and fatigue. You may wake up feeling tired and groggy (fibro fog). Other symptoms may include:   Bowel and bladder problems.  Headaches.  Visual problems.  Problems with odors and noises.  Depression or mood changes.  Painful menstrual periods (dysmenorrhea).  Dry skin or eyes. Myofascial pain syndrome Symptoms of myofascial pain syndrome include:   Tight, ropy bands of muscle.   Uncomfortable sensations in muscular areas, such as:  Aching.  Cramping.  Burning.  Numbness.  Tingling.   Muscle weakness.  Trouble moving certain muscles freely (range of motion). DIAGNOSIS  There are no specific tests to diagnose fibromyalgia or myofascial pain syndrome. Both can be hard to diagnose because their symptoms are common in many other conditions. Your health care provider may suspect one or both of these conditions based on your symptoms and medical history. Your health care provider will also do a physical exam.  The key to diagnosing fibromyalgia is having pain, fatigue,  and other symptoms for more than three months that cannot be explained by another condition.  The key to diagnosing myofascial pain syndrome is finding trigger points in muscles that are tender and cause pain elsewhere in your body (referred pain). TREATMENT  Treating fibromyalgia and myofascial pain often requires a team of health care providers. This usually starts with your primary provider and a physical therapist. You may also find it helpful to work with alternative health care providers, such as massage therapists or acupuncturists. Treatment for fibromyalgia may include medicines. This may include nonsteroidal anti-inflammatory drugs (NSAIDs), along with other medicines.    Treatment for myofascial pain may also include:  NSAIDs.  Cooling and stretching of muscles.  Trigger point injections.  Sound wave (ultrasound) treatments to stimulate muscles. HOME CARE INSTRUCTIONS   Take medicines only as directed by your health care provider.  Exercise as directed by your health care provider or physical therapist.  Try to avoid stressful situations.  Practice relaxation techniques to control your stress. You may want to try:  Biofeedback.  Visual imagery.  Hypnosis.  Muscle relaxation.  Yoga.  Meditation.  Talk to your health care provider about alternative treatments, such as acupuncture or massage treatment.  Maintain a healthy lifestyle. This includes eating a healthy diet and getting enough sleep.  Consider joining a support group.  Do not do activities that stress or strain your muscles. That includes repetitive motions and heavy lifting. SEEK MEDICAL CARE IF:   You have new symptoms.  Your symptoms get worse.  You have side effects from your medicines.  You have trouble sleeping.  Your condition is causing depression or anxiety. FOR MORE INFORMATION   National Fibromyalgia Association: http://www.fmaware.orgwww.fmaware.[place]es: http://www.arthritis.orgwww.arthritis.org  American Chronic Pain Association: StreetWrestling.at.https://stevens.biz/   This information is not intended to replace advice given to you by your health care provider. Make sure you discuss any questions you have with your health care provider.   Document Released: 12/31/2004 Document Revised: 01/21/2014 Document Reviewed: 10/06/2013 Elsevier Interactive Patient Education [employer].

## 2015-06-30 NOTE — ED Notes (Signed)
Pt  Reports    Symptoms     Of      Pain  Upper  Back     With  Pain  Down   Shoulders      And  Down  Arms        With      Symptoms        With    Shortness  Of breath    With  Nausea  /  Vomiting     symptoms      X  4  Days        Nausea      Present  As   Well       Pt reports   Had  Episode  Today    Where     She  Developed   Tightness  In  Chest     And        Heavy    Sensation    Pt  Reports  Symptoms  Are  Better  At this  Time

## 2015-06-30 NOTE — ED Provider Notes (Signed)
CSN: [id number]     Arrival date & time 06/30/15  1402 History   First MD Initiated Contact with Patient 06/30/15 1421     Chief Complaint  Patient presents with  . Shoulder Pain   (Consider location/radiation/quality/duration/timing/severity/associated sxs/prior Treatment) HPI Comments: 40 year old severely obese female complaining of chest pain that started today. She describes it as tight. It is located along the mid sternum and parasternal borders continuing to the exact for process. Although she has had some shortness of breath recently she denies dyspnea now. She had an episode of diaphoresis associated with the chest pain earlier today.  For the past 6 weeks she has had pain across her upper back and between the scapula a as well as pain in the neck and running across the top of both shoulders and upper arms. She occasionally has palpitations and vomiting. 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 Denies any other known repetitive work.  She has a history of hypothyroidism, depression, anxiety, obesity, sedentary lifestyle.   Past Medical History  Diagnosis Date  . Hypothyroidism   . Depression   . Anxiety   . History of shoulder surgery   . Obesity   . Hypothyroid 04/26/2013  . Plantar fasciitis 04/26/2013   Past Surgical History  Procedure Laterality Date  . Appendectomy    . Abdominal hysterectomy    . Cholecystectomy    . Fracture surgery    . Leg surgery Right   . Shoulder surgery Right   . Dilation and curettage of uterus     Family History  Problem Relation Age of Onset  . Cancer Mother     cervical  . Diabetes Father   . Hypertension Father   . Cancer Maternal Aunt     breast  . Cancer Maternal Grandmother     breast  . Cancer Maternal Grandfather     prostate  . Anxiety disorder Son   . Asperger's syndrome Son   . OCD Son   . ADD / ADHD Son   . Depression Son    Social History  Substance Use Topics  . Smoking status: Never  Smoker   . Smokeless tobacco: Never Used  . Alcohol Use: Yes     Comment: wine occ   OB History    Gravida Para Term Preterm AB TAB SAB Ectopic Multiple Living   5 3   2  2   3      Review of Systems  Constitutional: Positive for activity change. Negative for fever and chills.  HENT: Negative.   Eyes: Negative.   Respiratory: Positive for chest tightness and shortness of breath.   Cardiovascular: Positive for chest pain and palpitations. Negative for leg swelling.  Gastrointestinal: Positive for nausea and vomiting.  Musculoskeletal: Positive for myalgias, back pain and neck pain. Negative for neck stiffness.  Skin: Negative for rash.  Neurological: Negative for syncope, speech difficulty and headaches.  All other systems reviewed and are negative.   Allergies  Codeine  Home Medications   Prior to Admission medications   Medication Sig Start Date End Date Taking? Authorizing Provider  Calcium Carbonate Antacid (TUMS PO) Take by mouth as needed.    Historical Provider, MD  diclofenac sodium (VOLTAREN) 1 % GEL Apply 1 application topically 4 (four) times daily. 06/30/15   Janne Napoleon, NP  levothyroxine (SYNTHROID, LEVOTHROID) 75 MCG tablet TAKE ONE TABLET BY MOUTH ONCE DAILY BEFORE BREAKFAST. 10/03/14   Estill Dooms, NP  LORazepam (  ATIVAN) 0.5 MG tablet Take 1 tablet (0.5 mg total) by mouth every 8 (eight) hours. 02/18/14   Estill Dooms, NP  Naproxen Sodium (ALEVE PO) Take by mouth daily as needed.    Historical Provider, MD  PARoxetine (PAXIL) 20 MG tablet Take 1 tablet (20 mg total) by mouth daily. 02/18/14   Estill Dooms, NP   Meds Ordered and Administered this Visit  Medications - No data to display  BP 118/78 mmHg  Pulse 73  Temp(Src) 98.1 F (36.7 C) (Oral)  Resp 18  SpO2 100% No data found.   Physical Exam  Constitutional: She is oriented to person, place, and time. She appears well-developed and well-nourished. No distress.  Eyes: Conjunctivae and  EOM are normal.  Neck: Normal range of motion. Neck supple.  Tenderness to the left splenius capitis insertion points in the base of the occipital bones.  Cardiovascular: Normal rate, regular rhythm, normal heart sounds and intact distal pulses.   Radial pulse and apical pulse regular, no irregularities or skipped beats auscultated or palpated.  Pulmonary/Chest: Effort normal and breath sounds normal. No respiratory distress. She has no wheezes. She has no rales.  Positive reproducible chest wall tenderness with palpation to the upper left sternal border and along the sternal borders to the xiphoid. Tenderness along the lower anterior and lateral costal margins. This reproduces the same pain for which she has been complaining today.  Abdominal: Soft. She exhibits no mass. There is no tenderness. There is no rebound and no guarding.  Obese  Musculoskeletal: Normal range of motion. She exhibits no edema or tenderness.  Tenderness to the left posterior cervical musculature, less tenderness with deep palpation across the bilateral trapezii, rhomboid muscle and parathoracic musculature. Patient is able to abduct the arm over her head to complete full range of motion. Distal neurovascular motor sensory is intact in extremities. Radial pulses 2+. Normal warmth and color skin. Moves all extremities, good muscle tone.  Lymphadenopathy:    She has no cervical adenopathy.  Neurological: She is alert and oriented to person, place, and time. No cranial nerve deficit. She exhibits normal muscle tone.  Skin: Skin is warm and dry.  Psychiatric: Judgment normal. Her affect is blunt. Her speech is not slurred.  Nursing note and vitals reviewed.   ED Course  Procedures (including critical care time)  Labs Review Labs Reviewed - No data to display  Imaging Review No results found.  ED ECG REPORT   Date: 06/30/2015  Rate: 63  Rhythm: normal sinus rhythm  QRS Axis: normal  Intervals: normal  ST/T Wave  abnormalities: normal  Conduction Disutrbances:none  Narrative Interpretation:   Old EKG Reviewed: none available  I have personally reviewed the EKG tracing and agree with the computerized printout as noted.  Visual Acuity Review  Right Eye Distance:   Left Eye Distance:   Bilateral Distance:    Right Eye Near:   Left Eye Near:    Bilateral Near:         MDM   1. Acute chest wall pain   2. Costochondritis   3. Myofasciitis   4. Muscle pain, myofascial    Use heat to muscles of the shoulders and back. Stretch the muscles as discussed and apply the Diclofenac gel, Salonpas (Menthol) may also help and heat on top of this.  Meds ordered this encounter  Medications  . diclofenac sodium (VOLTAREN) 1 % GEL    Sig: Apply 1 application topically 4 (four) times daily.  Dispense:  100 g    Refill:  0    Order Specific Question:  Supervising Provider    Answer:  Billy Fischer [phone]    Ice to the chest wall. Tylenol or Aleve can also be taken. F/u with your PCP prn.    Janne Napoleon, NP 06/30/15 [phone]

## 2015-07-04 ENCOUNTER — Encounter: Payer: Self-pay | Admitting: Women's Health

## 2015-07-04 ENCOUNTER — Ambulatory Visit (INDEPENDENT_AMBULATORY_CARE_PROVIDER_SITE_OTHER): Payer: Managed Care, Other (non HMO) | Admitting: Women's Health

## 2015-07-04 VITALS — BP 104/74 | HR 72 | Wt 278.0 lb

## 2015-07-04 DIAGNOSIS — F418 Other specified anxiety disorders: Secondary | ICD-10-CM | POA: Diagnosis not present

## 2015-07-04 DIAGNOSIS — E039 Hypothyroidism, unspecified: Secondary | ICD-10-CM | POA: Diagnosis not present

## 2015-07-04 NOTE — Progress Notes (Signed)
Patient ID: [name], female   DOB: Jul 01, 1975, 40 y.o.   MRN: [id number]   [facility] Visit  Patient name: [name] MRN [id number]  Date of birth: February 21, 1975  CC & HPI:  [name] is a 40 y.o. [phone] Caucasian female presenting today to discuss getting back on antidepressants and synthroid, she has been off both for >75yr and depression/anxiety is getting to point where she doesn't even want to go out of house. Denies SI/HI. She currently does not have a PCP, had appt to establish care w/ Dr. Anastasio Champion in Aug, but got called yesterday saying they have an opening for tomorrow- but did not want to cancel w/ [place] since it was <24hr notice. Discussed since she is establishing care w/ PCP tomorrow- he would be better to manage these chronic conditions for her, she agrees.   No LMP recorded. Patient has had a hysterectomy. Per op note took cervix as well, non-cancerous reasons, no h/o abnormal paps per pt  Pertinent History Reviewed:  Medical & Surgical Hx:   Past medical, surgical, family, and social history reviewed in electronic medical record Medications: Reviewed & Updated - see associated section Allergies: Reviewed in electronic medical record  Objective Findings:  Vitals: BP 104/74 mmHg  Pulse 72  Wt 278 lb (126.1 kg) Body mass index is 47.7 kg/(m^2).  Physical Examination: General appearance - alert, well appearing, and in no distress  No results found for this or any previous visit (from the past 24 hour(s)).   Assessment & Plan:  A:   Depression/anxiety  Hypothyroidism  S/P hysterectomy for non-cancerous reasons  P:  Keep appt w/ Dr. Anastasio Champion tomorrow to establish care/discuss getting back on antidepressants/synthroid  No need for paps  Return for prn.  Tawnya Crook CNM, [facility] 07/04/2015 9:34 AM

## 2016-07-29 ENCOUNTER — Ambulatory Visit (INDEPENDENT_AMBULATORY_CARE_PROVIDER_SITE_OTHER): Payer: Managed Care, Other (non HMO) | Admitting: Women's Health

## 2016-07-29 ENCOUNTER — Encounter: Payer: Self-pay | Admitting: Women's Health

## 2016-07-29 VITALS — BP 124/80 | HR 74 | Wt 283.0 lb

## 2016-07-29 DIAGNOSIS — F410 Panic disorder [episodic paroxysmal anxiety] without agoraphobia: Secondary | ICD-10-CM | POA: Diagnosis not present

## 2016-07-29 DIAGNOSIS — E039 Hypothyroidism, unspecified: Secondary | ICD-10-CM

## 2016-07-29 DIAGNOSIS — N6320 Unspecified lump in the left breast, unspecified quadrant: Secondary | ICD-10-CM | POA: Diagnosis not present

## 2016-07-29 DIAGNOSIS — F418 Other specified anxiety disorders: Secondary | ICD-10-CM

## 2016-07-29 MED ORDER — SERTRALINE HCL 50 MG PO TABS: 50.0000 mg | ORAL_TABLET | Freq: Every day | ORAL | 6 refills | Status: DC

## 2016-07-29 NOTE — Patient Instructions (Addendum)
Breast ultrasound and mammogram Tues 8/7 @ 9:00am at [facility], be there at 8:45am, no lotion/deoderant/powder/perfume that day    Major Depressive Disorder, Adult Major depressive disorder (MDD) is a mental health condition. It may also be called clinical depression or unipolar depression. MDD usually causes feelings of sadness, hopelessness, or helplessness. MDD can also cause physical symptoms. It can interfere with work, school, relationships, and other everyday activities. MDD may be mild, moderate, or severe. It may occur once (single episode major depressive disorder) or it may occur multiple times (recurrent major depressive disorder). What are the causes? The exact cause of this condition is not known. MDD is most likely caused by a combination of things, which may include:  Genetic factors. These are traits that are passed along from parent to child.  Individual factors. Your personality, your behavior, and the way you handle your thoughts and feelings may contribute to MDD. This includes personality traits and behaviors learned from others.  Physical factors, such as: ? Differences in the part of your brain that controls emotion. This part of your brain may be different than it is in people who do not have MDD. ? Long-term (chronic) medical or psychiatric illnesses.  Social factors. Traumatic experiences or major life changes may play a role in the development of MDD.  What increases the risk? This condition is more likely to develop in women. The following factors may also make you more likely to develop MDD:  A family history of depression.  Troubled family relationships.  Abnormally low levels of certain brain chemicals.  Traumatic events in childhood, especially abuse or the loss of a parent.  Being under a lot of stress, or long-term stress, especially from upsetting life experiences or losses.  A history of: ? Chronic physical illness. ? Other mental health  disorders. ? Substance abuse.  Poor living conditions.  Experiencing social exclusion or discrimination on a regular basis.  What are the signs or symptoms? The main symptoms of MDD typically include:  Constant depressed or irritable mood.  Loss of interest in things and activities.  MDD symptoms may also include:  Sleeping or eating too much or too little.  Unexplained weight change.  Fatigue or low energy.  Feelings of worthlessness or guilt.  Difficulty thinking clearly or making decisions.  Thoughts of suicide or of harming others.  Physical agitation or weakness.  Isolation.  Severe cases of MDD may also occur with other symptoms, such as:  Delusions or hallucinations, in which you imagine things that are not real (psychotic depression).  Low-level depression that lasts at least a year (chronic depression or persistent depressive disorder).  Extreme sadness and hopelessness (melancholic depression).  Trouble speaking and moving (catatonic depression).  How is this diagnosed? This condition may be diagnosed based on:  Your symptoms.  Your medical history, including your mental health history. This may involve tests to evaluate your mental health. You may be asked questions about your lifestyle, including any drug and alcohol use, and how long you have had symptoms of MDD.  A physical exam.  Blood tests to rule out other conditions.  You must have a depressed mood and at least four other MDD symptoms most of the day, nearly every day in the same 2-week timeframe before your health care provider can confirm a diagnosis of MDD. How is this treated? This condition is usually treated by mental health professionals, such as psychologists, psychiatrists, and clinical social workers. You may need more than one type  of treatment. Treatment may include:  Psychotherapy. This is also called talk therapy or counseling. Types of psychotherapy include: ? Cognitive  behavioral therapy (CBT). This type of therapy teaches you to recognize unhealthy feelings, thoughts, and behaviors, and replace them with positive thoughts and actions. ? Interpersonal therapy (IPT). This helps you to improve the way you relate to and communicate with others. ? Family therapy. This treatment includes members of your family.  Medicine to treat anxiety and depression, or to help you control certain emotions and behaviors.  Lifestyle changes, such as: ? Limiting alcohol and drug use. ? Exercising regularly. ? Getting plenty of sleep. ? Making healthy eating choices. ? Spending more time outdoors.  Treatments involving stimulation of the brain can be used in situations with extremely severe symptoms, or when medicine or other therapies do not work over time. These treatments include electroconvulsive therapy, transcranial magnetic stimulation, and vagal nerve stimulation. Follow these instructions at home: Activity  Return to your normal activities as told by your health care provider.  Exercise regularly and spend time outdoors as told by your health care provider. General instructions  Take over-the-counter and prescription medicines only as told by your health care provider.  Do not drink alcohol. If you drink alcohol, limit your alcohol intake to no more than 1 drink a day for nonpregnant women and 2 drinks a day for men. One drink equals 12 oz of beer, 5 oz of wine, or 1 oz of hard liquor. Alcohol can affect any antidepressant medicines you are taking. Talk to your health care provider about your alcohol use.  Eat a healthy diet and get plenty of sleep.  Find activities that you enjoy doing, and make time to do them.  Consider joining a support group. Your health care provider may be able to recommend a support group.  Keep all follow-up visits as told by your health care provider. This is important. Where to find more information: [employer] on Mental  Illness  www.nami.org  U.S. National Institute of Mental Health  https://carter.com/  National Suicide Prevention Lifeline  1-800-273-TALK 724-525-6824). This is free, 24-hour help.  Contact a health care provider if:  Your symptoms get worse.  You develop new symptoms. Get help right away if:  You self-harm.  You have serious thoughts about hurting yourself or others.  You see, hear, taste, smell, or feel things that are not present (hallucinate). This information is not intended to replace advice given to you by your health care provider. Make sure you discuss any questions you have with your health care provider. Document Released: 04/27/2012 Document Revised: 09/07/2015 Document Reviewed: 07/12/2015 Elsevier Interactive Patient Education  2017 [employer].

## 2016-07-29 NOTE — Progress Notes (Signed)
   [place] [facility] Visit  Patient name: [name] MRN [id number]  Date of birth: 04-08-1975  CC & HPI:  [name] is a 41 y.o. [phone] Caucasian female presenting today for report of Lt breast mass felt by husband few days ago. Has not gotten mammogram since turning 41yo in March. Also depression/anxiety- has been seeing Dr. Anastasio Champion, he wanted to treat naturally and pt feels it is not working and needs meds. Does not want to see him for management of depression/anxiety or hypothyroidism anymore. Last dose of synthroid was 21mcg in November- has since stopped taking- wants to get back on it. Also interested in talking about possible meds for appetite suppression.  Has h/o depression/anxiety- has been on multiple meds in past. Did not like wellbutrin, celexa, or paxil. Was on Lexapor and Pristiq- can't really remember how they did. Denies SI/HI. Has panic attacks, mainly when in public/around groups. Sometimes to point of having to use her inhaler. Definitely feels she needs to be back on meds. Undecided about counseling- discussed this as an option.  No LMP recorded. Patient has had a hysterectomy.  The current method of family planning is status post hysterectomy- noncancerous reasons Last pap prior to hysterectomy  Pertinent History Reviewed:  Medical & Surgical Hx:   Past medical, surgical, family, and social history reviewed in electronic medical record Medications: Reviewed & Updated - see associated section Allergies: Reviewed in electronic medical record  Objective Findings:  Vitals: BP 124/80 (BP Location: Right Arm, Patient Position: Sitting, Cuff Size: Large)   Pulse 74   Wt 283 lb (128.4 kg)   BMI 48.58 kg/m  Body mass index is 48.58 kg/m.  Physical Examination: General appearance - alert, well appearing, and in no distress Breasts - bilateral breasts w/ generalized lumps/bumps, no discrete mass felt on either side. However, husband felt pea-sized mass Lt breast around  ~3o'clock. Unable to feel any discrete mass in this area today  No results found for this or any previous visit (from the past 24 hour(s)).   Assessment & Plan:  A:   Lt breast mass felt by husband  Depression/anxiety, panic attacks  Hypothryoidism  Obesity, BMI 48.58  P:  Scheduled diagnostic bilateral mammo and u/s 8/7 at [facility] at 0900, be there at [place], no lotion/powder/deoderant/perfume that day (there was an appt for 7/31, pt will be out of town)  Rx zoloft 50mg  daily, understands can take a few weeks to notice improvement- pt states she plans to start taking next Monday- is going out of town at the end of this week and doesn't want to start something new until back in town  Check TSH today  If decides she wants to discuss appetite suppressants, call back to make appt w/ Anderson [facility]  If decides for counseling, let us know  Return in about 5 weeks (around 09/02/2016) for F/U. (4wks from starting zoloft)  Tawnya Crook CNM, [facility] 07/29/2016 5:18 PM

## 2016-07-30 LAB — TSH: TSH: 4.25 u[IU]/mL (ref 0.450–4.500)

## 2016-08-01 ENCOUNTER — Other Ambulatory Visit: Payer: Self-pay | Admitting: Women's Health

## 2016-08-01 DIAGNOSIS — E039 Hypothyroidism, unspecified: Secondary | ICD-10-CM

## 2016-08-03 LAB — SPECIMEN STATUS REPORT

## 2016-08-05 ENCOUNTER — Other Ambulatory Visit: Payer: Self-pay | Admitting: Women's Health

## 2016-08-05 DIAGNOSIS — E039 Hypothyroidism, unspecified: Secondary | ICD-10-CM

## 2016-08-05 MED ORDER — LEVOTHYROXINE SODIUM 50 MCG PO TABS: 50.0000 ug | ORAL_TABLET | Freq: Every day | ORAL | 6 refills | Status: DC

## 2016-08-05 NOTE — Progress Notes (Signed)
Notified pt of all labs, TSH and thyroid panel normal. Pt states she has been taking synthroid 9mcg sporadically, so will send rx for same- to take every day. Recheck TSH in 8wks.  Roma Schanz, CNM, [facility] 08/05/2016 5:15 PM

## 2016-08-06 LAB — SPECIMEN STATUS REPORT

## 2016-08-06 LAB — THYROID PANEL
Free Thyroxine Index: 1.8 (ref 1.2–4.9)
T3 UPTAKE RATIO: 25 % (ref 24–39)
T4, Total: 7.1 ug/dL (ref 4.5–12.0)

## 2016-08-13 ENCOUNTER — Encounter (HOSPITAL_COMMUNITY): Payer: Managed Care, Other (non HMO)

## 2016-08-20 ENCOUNTER — Ambulatory Visit (HOSPITAL_COMMUNITY)
Admission: RE | Admit: 2016-08-20 | Discharge: 2016-08-20 | Disposition: A | Discharge status: Home / Self Care | Payer: Managed Care, Other (non HMO) | Source: Ambulatory Visit | Attending: Women's Health | Admitting: Women's Health

## 2016-08-20 DIAGNOSIS — N6321 Unspecified lump in the left breast, upper outer quadrant: Secondary | ICD-10-CM | POA: Insufficient documentation

## 2016-08-20 DIAGNOSIS — N6320 Unspecified lump in the left breast, unspecified quadrant: Secondary | ICD-10-CM | POA: Diagnosis present

## 2016-08-26 ENCOUNTER — Telehealth: Payer: Self-pay | Admitting: Adult Health

## 2016-08-26 NOTE — Telephone Encounter (Signed)
Pt aware that has appt 9/6 at 3 pm be there at 2:30 pm with Dr Donne Hazel

## 2016-09-02 ENCOUNTER — Encounter: Payer: Self-pay | Admitting: Women's Health

## 2016-09-02 ENCOUNTER — Ambulatory Visit (INDEPENDENT_AMBULATORY_CARE_PROVIDER_SITE_OTHER): Payer: Managed Care, Other (non HMO) | Admitting: Women's Health

## 2016-09-02 VITALS — BP 100/80 | HR 80 | Ht 63.0 in | Wt 283.0 lb

## 2016-09-02 DIAGNOSIS — F418 Other specified anxiety disorders: Secondary | ICD-10-CM | POA: Diagnosis not present

## 2016-09-02 DIAGNOSIS — Z803 Family history of malignant neoplasm of breast: Secondary | ICD-10-CM | POA: Diagnosis not present

## 2016-09-02 MED ORDER — SERTRALINE HCL 100 MG PO TABS: 100.0000 mg | ORAL_TABLET | Freq: Every day | ORAL | 6 refills | Status: DC

## 2016-09-02 NOTE — Progress Notes (Signed)
   [place] [facility] Visit  Patient name: [name] MRN [id number]  Date of birth: January 23, 1975  CC & HPI:  [name] is a 41 y.o. [phone] Caucasian female presenting today for f/u on zoloft 50mg  that was rx'd 07/29/16. Pt states she has noticed no improvement in depression/anxiety. Had u/s & mammo for Lt breast mass, bi-rads 3, recommended f/u in 68mths, however w/ pt's family h/o breast cancer- her family was concerned and encouraged her to see surgeon for possible removal- she has this scheduled w/ Dr. Donne Hazel @ [facility] on 9/6 @ 3pm. States none of family has ever had genetic counseling/testing, she is interested in this.  No LMP recorded. Patient has had a hysterectomy. The current method of family planning is status post hysterectomy. Last pap prior to hyesterectomy  Pertinent History Reviewed:  Medical & Surgical Hx:   Past medical, surgical, family, and social history reviewed in electronic medical record Medications: Reviewed & Updated - see associated section Allergies: Reviewed in electronic medical record  Objective Findings:  Vitals: BP 100/80   Pulse 80   Ht 5\' 3"  (1.6 m)   Wt 283 lb (128.4 kg)   BMI 50.13 kg/m  Body mass index is 50.13 kg/m.  Physical Examination: General appearance - alert, well appearing, and in no distress  No results found for this or any previous visit (from the past 24 hour(s)).   Assessment & Plan:  A:   Depression/anxiety, panic attacks  F/U on meds  Family h/o breast cancer  P:  Keep appt w/ Dr. Donne Hazel 9/6 @ 3pm as scheduled to discuss removal of Lt breast mass  Order placed for referral to genetic counselor, wants [facility] appt  Increase zoloft to 100mg  daily, new rx sent  Return in about 4 weeks (around 09/30/2016) for F/U & labs.  Tawnya Crook CNM, [facility] 09/02/2016 9:44 AM

## 2016-09-02 NOTE — Patient Instructions (Addendum)
Come for labs to recheck your thyroid on 9/17

## 2016-09-30 ENCOUNTER — Ambulatory Visit: Payer: Managed Care, Other (non HMO) | Admitting: Women's Health

## 2016-09-30 ENCOUNTER — Other Ambulatory Visit: Payer: Managed Care, Other (non HMO)

## 2018-01-21 IMAGING — MG 2D DIGITAL DIAGNOSTIC BILATERAL MAMMOGRAM WITH CAD AND ADJUNCT T
6 of 10 series · 6 of 30 positions shown · non-contrast
Comparison: Previous exam(s).

CLINICAL DATA: Left breast upper outer quadrant area of palpable
concern felt by the patient.

EXAM:
2D DIGITAL DIAGNOSTIC BILATERAL MAMMOGRAM WITH CAD AND ADJUNCT TOMO
ULTRASOUND LEFT BREAST

[R CC]
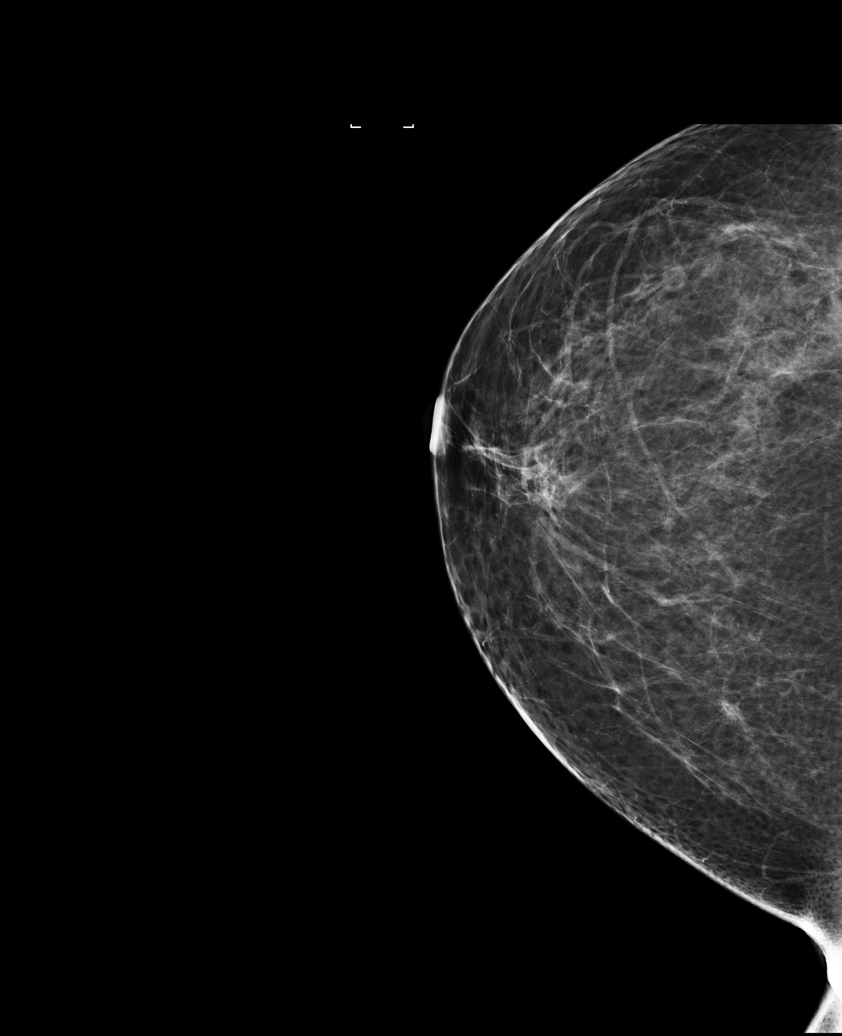

[L CC]
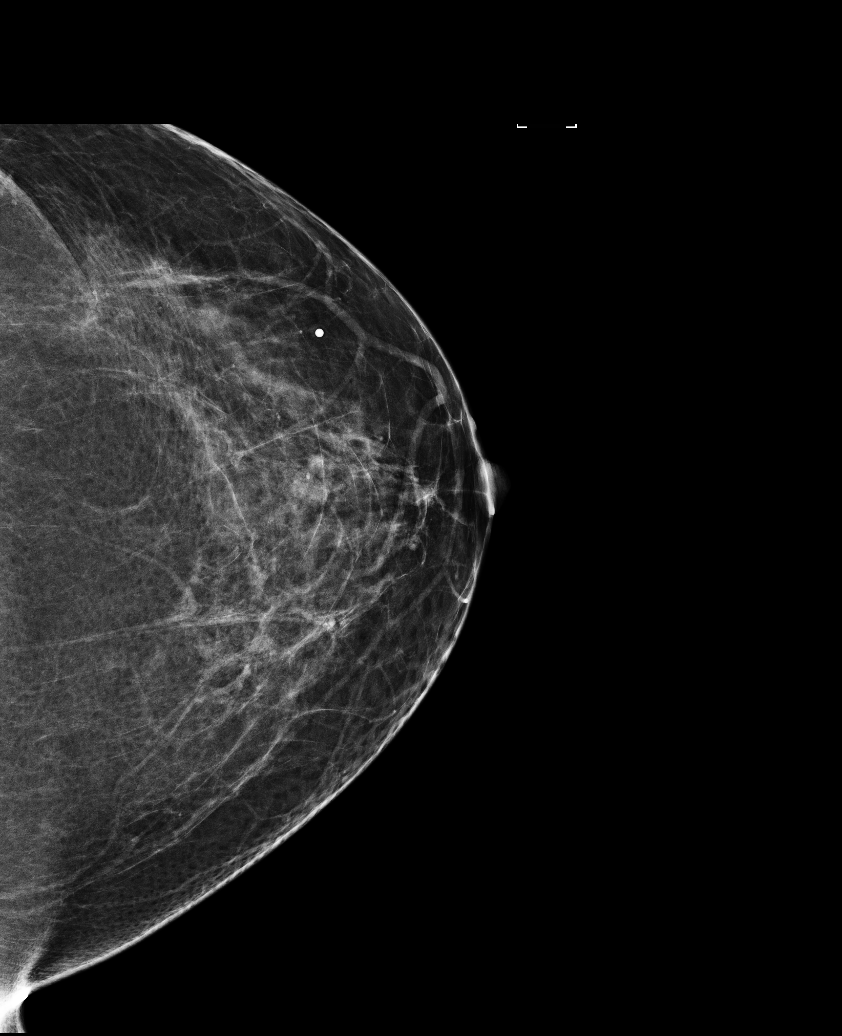

[R MLO]
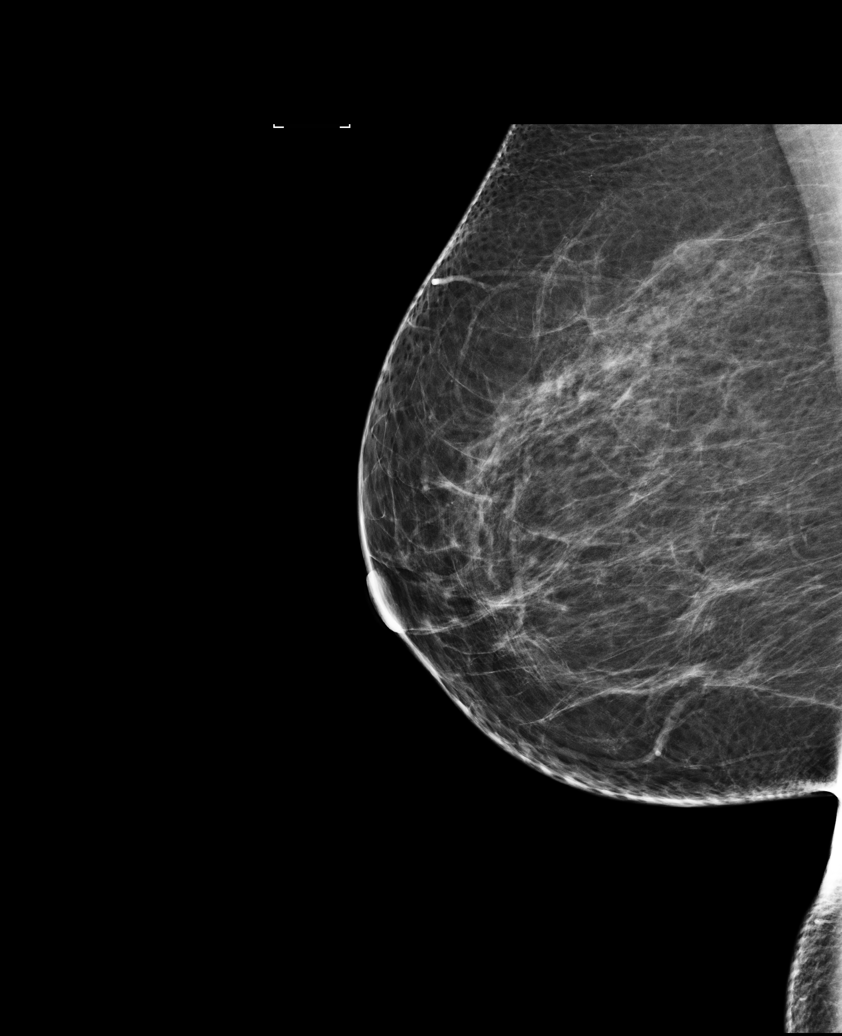

[L MLO]
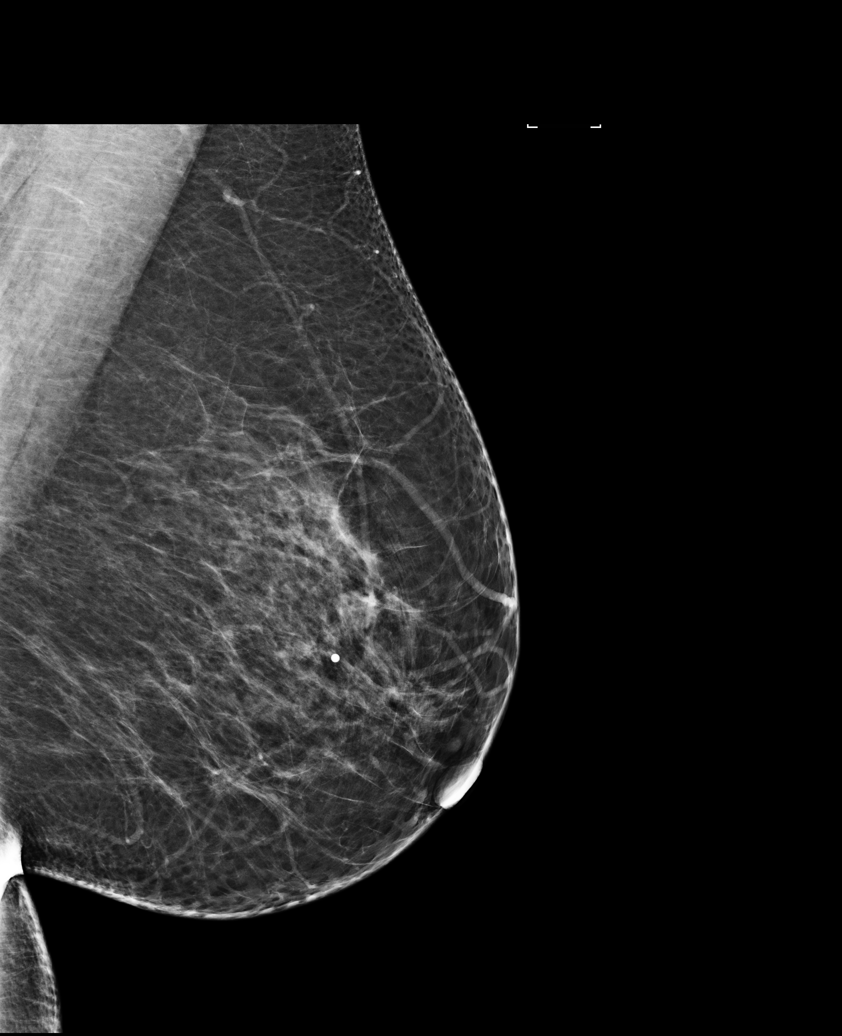

[L TAN]
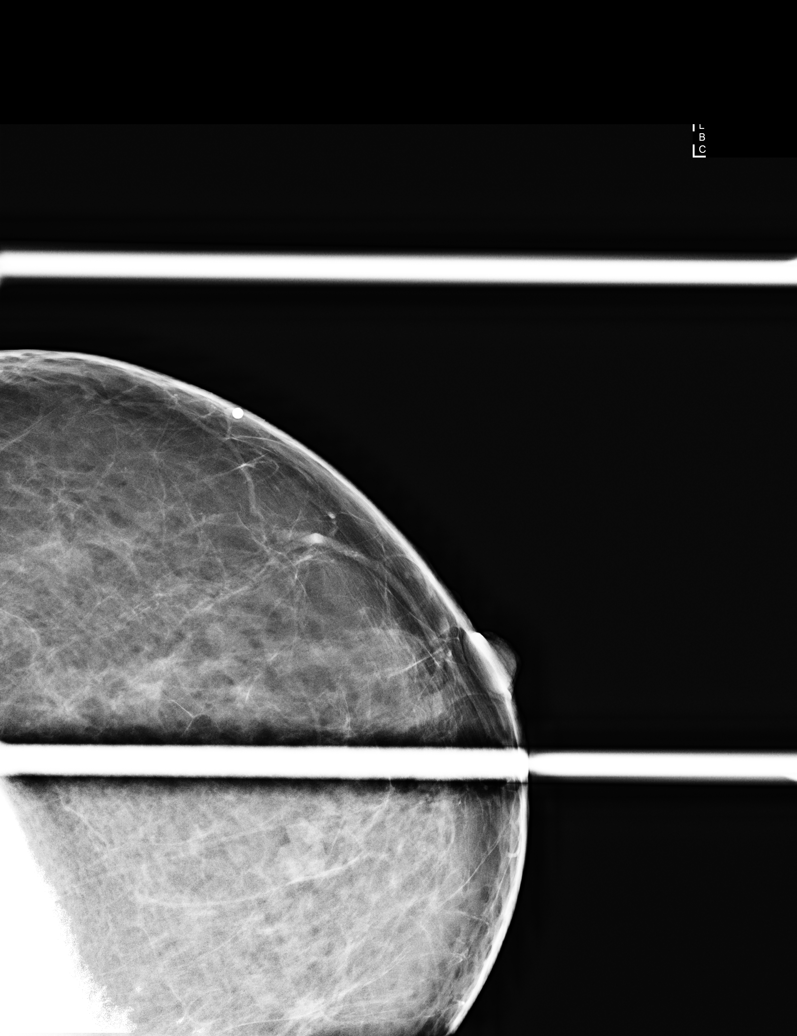

[R CC tomo · tomo slice 33/66.0]
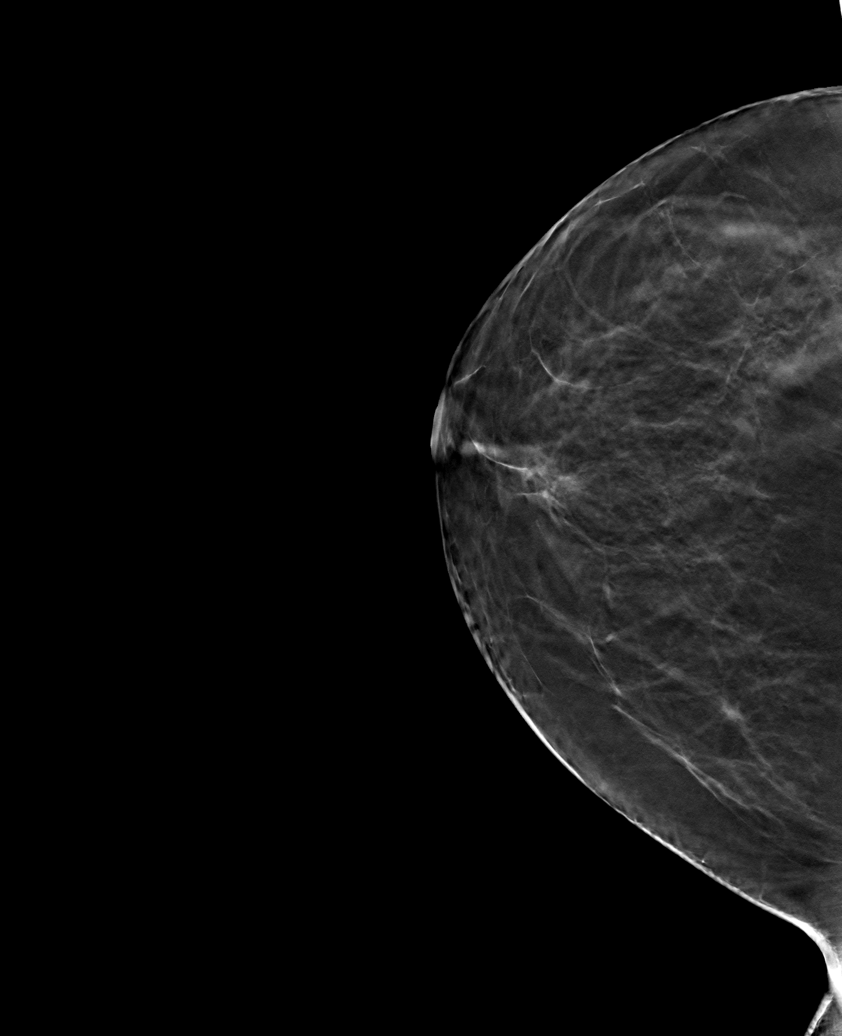

[6 of 30 positions shown; findings below may reference images not displayed]

ACR Breast Density Category b: There are scattered areas of
fibroglandular density.
FINDINGS: Mammographically, there are no suspicious masses, areas of
architectural distortion or microcalcifications in the right breast.
In the left breast slightly upper slightly outer quadrant, anterior
depth, there is a low-density circumscribed slightly larger than a
cm mass. No mammographic abnormalities are seen to correlate to the
area of palpable concern.

Mammographic images were processed with CAD.

On physical exam, no suspicious masses are palpated.

Targeted ultrasound is performed, showing left breast 12:30 o'clock
4 cm from the nipple hypoechoic circumscribed horizontally oriented
mass which measures 1.3 x 0.4 x 1.1 cm. The sonographic appearance
is suggestive of a complicated cyst or a fibrocystic nodule. This
finding corresponds to the mammographically seen low-density
circumscribed mass. No sonographic correlation to the area of
palpable concern.
IMPRESSION: Left breast 12:30 o'clock probably benign 1.3 cm mass.

Otherwise, no evidence of malignancy in either breast.

No mammographic or sonographic abnormalities within the area of
palpable concern in the left breast.

RECOMMENDATION:
Left breast mammogram and possibly ultrasound in 6 months.

Further management of the area of palpable concern should be based
on clinical grounds.

I have discussed the findings and recommendations with the patient.
Results were also provided in writing at the conclusion of the
visit. If applicable, a reminder letter will be sent to the patient
regarding the next appointment.

BI-RADS CATEGORY  3: Probably benign.

## 2018-06-24 ENCOUNTER — Other Ambulatory Visit: Payer: Self-pay | Admitting: Internal Medicine

## 2018-06-24 DIAGNOSIS — N6310 Unspecified lump in the right breast, unspecified quadrant: Secondary | ICD-10-CM

## 2018-06-24 DIAGNOSIS — N6320 Unspecified lump in the left breast, unspecified quadrant: Secondary | ICD-10-CM

## 2018-06-30 ENCOUNTER — Ambulatory Visit (INDEPENDENT_AMBULATORY_CARE_PROVIDER_SITE_OTHER): Payer: Managed Care, Other (non HMO) | Admitting: Cardiology

## 2018-06-30 ENCOUNTER — Encounter: Payer: Self-pay | Admitting: Cardiology

## 2018-06-30 ENCOUNTER — Other Ambulatory Visit: Payer: Self-pay

## 2018-06-30 VITALS — BP 123/78 | HR 86 | Temp 97.9°F | Ht 63.0 in | Wt 294.6 lb

## 2018-06-30 DIAGNOSIS — I493 Ventricular premature depolarization: Secondary | ICD-10-CM

## 2018-06-30 DIAGNOSIS — R0602 Shortness of breath: Secondary | ICD-10-CM | POA: Diagnosis not present

## 2018-06-30 DIAGNOSIS — R002 Palpitations: Secondary | ICD-10-CM | POA: Diagnosis not present

## 2018-06-30 NOTE — Progress Notes (Signed)
Cardiology Office Note  Date: 06/30/2018   ID: [name], DOB 02/18/1975, MRN [id number]  PCP:  Doree Albee, MD  Consulting Cardiologist:  Rozann Lesches, MD Electrophysiologist:  None   Chief Complaint  Patient presents with  . Palpitations    History of Present Illness: [name] is a 43 y.o. female referred for cardiology consultation by Dr. Anastasio Champion for the evaluation of palpitations.  She tells me that she has been feeling intermittent palpitations at least for the last year.  She had a particularly prominent episode of rapid heartbeat at rest back in February, she felt nauseated and also lightheaded, the symptoms passed and she did not seek medical attention.  She has never had syncope in association with her palpitations.  She does feel like her heart speeds up when she exerts herself such as walking up steps and this makes her short of breath.  She has a history of hypothyroidism, is currently on replacement therapy and follows with Dr. Anastasio Champion.  She does not report any stimulant use or unusual degree of caffeine intake.  No family history of specific cardiac arrhythmia other than atrial fibrillation.  No history of sudden cardiac death.  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  She has had some stress recently, but indicates that her palpitations have preceded this.  Past Medical History:  Diagnosis Date  . Anxiety   . Asthma   . Depression   . History of shoulder surgery   . Hypothyroidism   . Obesity   . Plantar fasciitis 04/26/2013    Past Surgical History:  Procedure Laterality Date  . ABDOMINAL HYSTERECTOMY    . APPENDECTOMY    . CHOLECYSTECTOMY    . DILATION AND CURETTAGE OF UTERUS    . FRACTURE SURGERY    . LEG SURGERY Right   . SHOULDER SURGERY Right     Current Outpatient Medications  Medication Sig Dispense Refill  . acetaminophen (TYLENOL) 500 MG tablet Take 500 mg by mouth every 6 (six) hours as needed.    . Calcium  Carbonate Antacid (TUMS PO) Take by mouth as needed. Reported on 07/04/2015    . ibuprofen (ADVIL,MOTRIN) 200 MG tablet Take 200 mg by mouth every 6 (six) hours as needed.    . thyroid (NP THYROID) 90 MG tablet Take 90 mg by mouth daily.     No current facility-administered medications for this visit.    Allergies:  Codeine   Social History: The patient  reports that she has never smoked. She has never used smokeless tobacco. She reports current alcohol use. She reports that she does not use drugs.   Family History: The patient's family history includes ADD / ADHD in her son; Anxiety disorder in her son; Arthritis in her maternal grandmother; Asperger's syndrome in her son; Breast cancer in her maternal aunt and maternal grandmother; Cervical cancer in her mother; Depression in her son; Diabetes in her father; Hypertension in her father; OCD in her son; Prostate cancer in her maternal grandfather.   ROS:  Please see the history of present illness. Otherwise, complete review of systems is positive for none.  All other systems are reviewed and negative.   Physical Exam: VS:  BP 123/78   Pulse 86   Temp 97.9 F (36.6 C)   Ht 5\' 3"  (1.6 m)   Wt 294 lb 9.6 oz (133.6 kg)   SpO2 100%   BMI 52.19 kg/m , BMI Body mass index is 52.19  kg/m.  Wt Readings from Last 3 Encounters:  06/30/18 294 lb 9.6 oz (133.6 kg)  09/02/16 283 lb (128.4 kg)  07/29/16 283 lb (128.4 kg)    General: Obese woman, appears comfortable at rest. HEENT: Conjunctiva and lids normal, oropharynx clear. Neck: Supple, no elevated JVP or carotid bruits, no thyromegaly. Lungs: Clear to auscultation, nonlabored breathing at rest. Cardiac: Regular rate and rhythm, no S3 or significant systolic murmur, no pericardial rub. Abdomen: Soft, nontender, bowel sounds present. Extremities: No pitting edema, distal pulses 2+. Skin: Warm and dry. Musculoskeletal: No kyphosis. Neuropsychiatric: Alert and oriented x3, affect grossly  appropriate.  ECG:  An ECG dated 06/23/2018 was personally reviewed today and demonstrated:  Sinus rhythm with decreased R wave progression and frequent PVCs, inferior axis.  Recent Labwork:  Lab work available for review.  Other Studies Reviewed Today:  No previous cardiac studies for review.  Assessment and Plan:  1.  History of palpitations and recently documented frequent PVCs by ECG in early June per Dr. Anastasio Champion.  PVCs are inferior axis and most likely from the outflow tract, and in that case are typically benign although can be symptom provoking.  I wonder if she had an episode of limited VT back in February based on her description of symptoms.  Plan is to further quantify PVCs with a cardiac monitor and obtain an echocardiogram for assessment of cardiac structure and function.  Based on this we can determine next step in evaluation.  We discussed limiting stimulant use/caffeine use.  Also work on getting thyroid status and check with Dr. Anastasio Champion.  2.  Hypothyroidism, on replacement therapy with follow-up by Dr. Anastasio Champion.  3.  History of asthma.  Medication Adjustments/Labs and Tests Ordered: Current medicines are reviewed at length with the patient today.  Concerns regarding medicines are outlined above.   Tests Ordered: Orders Placed This Encounter  Procedures  . LONG TERM MONITOR (3-14 DAYS)  . ECHOCARDIOGRAM COMPLETE    Medication Changes: No orders of the defined types were placed in this encounter.   Disposition:  Follow up test results and determine next step.  Signed, Satira Sark, MD, [facility] 06/30/2018 3:51 PM    [place] [facility] [place], [place], [place] [address] Phone: [phone]; Fax: [phone]

## 2018-06-30 NOTE — Patient Instructions (Addendum)
Medication Instructions:    Your physician recommends that you continue on your current medications as directed. Please refer to the Current Medication list given to you today.  Labwork:  NONE  Testing/Procedures: Your physician has requested that you have an echocardiogram. Echocardiography is a painless test that uses sound waves to create images of your heart. It provides your doctor with information about the size and shape of your heart and how well your heart's chambers and valves are working. This procedure takes approximately one hour. There are no restrictions for this procedure. Your physician has recommended that you wear a holter monitor for 72 hours. Event monitors are medical devices that record the heart's electrical activity. Doctors most often [place] these monitors to diagnose arrhythmias. Arrhythmias are problems with the speed or rhythm of the heartbeat. The monitor is a small, portable device. You can wear one while you do your normal daily activities. This is usually used to diagnose what is causing palpitations/syncope (passing out).  Follow-Up:  Your physician recommends that you schedule a follow-up appointment in: pending test results.  Any Other Special Instructions Will Be Listed Below (If Applicable).  If you need a refill on your cardiac medications before your next appointment, please call your pharmacy.

## 2018-07-01 ENCOUNTER — Other Ambulatory Visit: Payer: Self-pay

## 2018-07-01 ENCOUNTER — Ambulatory Visit (INDEPENDENT_AMBULATORY_CARE_PROVIDER_SITE_OTHER): Payer: Managed Care, Other (non HMO)

## 2018-07-01 ENCOUNTER — Telehealth: Payer: Self-pay | Admitting: *Deleted

## 2018-07-01 ENCOUNTER — Other Ambulatory Visit: Payer: Self-pay | Admitting: Cardiology

## 2018-07-01 DIAGNOSIS — R0602 Shortness of breath: Secondary | ICD-10-CM | POA: Diagnosis not present

## 2018-07-01 NOTE — Telephone Encounter (Signed)
-----   Message from Satira Sark, MD sent at 07/01/2018  2:56 PM EDT ----- Results reviewed.  Please let her know that the echocardiogram was reassuring with normal LV and RV function, no major valvular abnormalities.  Proceed with current work-up.

## 2018-07-01 NOTE — Telephone Encounter (Signed)
Patient informed. Copy sent to PCP °

## 2018-07-03 ENCOUNTER — Ambulatory Visit
Admission: RE | Admit: 2018-07-03 | Discharge: 2018-07-03 | Disposition: A | Discharge status: Home / Self Care | Payer: Managed Care, Other (non HMO) | Source: Ambulatory Visit | Attending: Internal Medicine | Admitting: Internal Medicine

## 2018-07-03 ENCOUNTER — Ambulatory Visit: Payer: Managed Care, Other (non HMO) | Admitting: Cardiovascular Disease

## 2018-07-03 ENCOUNTER — Other Ambulatory Visit: Payer: Self-pay

## 2018-07-03 DIAGNOSIS — N6310 Unspecified lump in the right breast, unspecified quadrant: Secondary | ICD-10-CM

## 2018-07-03 DIAGNOSIS — N6320 Unspecified lump in the left breast, unspecified quadrant: Secondary | ICD-10-CM

## 2018-07-13 ENCOUNTER — Telehealth: Payer: Self-pay | Admitting: *Deleted

## 2018-07-14 NOTE — Telephone Encounter (Signed)
Patient canceled ZIO patch due to out of pocket cost $700 up front. Underinsured contact number given to patient to request assistance for ZIO. Patient will call back if they are unable to assist her with ZIO patch.

## 2019-01-21 ENCOUNTER — Encounter: Payer: Self-pay | Admitting: Adult Health

## 2019-01-21 ENCOUNTER — Other Ambulatory Visit: Payer: Self-pay

## 2019-01-21 ENCOUNTER — Ambulatory Visit: Payer: BC Managed Care – PPO | Admitting: Adult Health

## 2019-01-21 VITALS — BP 127/80 | HR 70 | Ht 63.0 in | Wt 290.0 lb

## 2019-01-21 DIAGNOSIS — F902 Attention-deficit hyperactivity disorder, combined type: Secondary | ICD-10-CM

## 2019-01-21 DIAGNOSIS — F410 Panic disorder [episodic paroxysmal anxiety] without agoraphobia: Secondary | ICD-10-CM | POA: Diagnosis not present

## 2019-01-21 DIAGNOSIS — F411 Generalized anxiety disorder: Secondary | ICD-10-CM | POA: Diagnosis not present

## 2019-01-21 DIAGNOSIS — F331 Major depressive disorder, recurrent, moderate: Secondary | ICD-10-CM | POA: Diagnosis not present

## 2019-01-21 MED ORDER — VORTIOXETINE HBR 10 MG PO TABS: 10.0000 mg | ORAL_TABLET | Freq: Every day | ORAL | 0 refills | Status: DC

## 2019-01-21 MED ORDER — AMPHETAMINE-DEXTROAMPHETAMINE 20 MG PO TABS: 20.0000 mg | ORAL_TABLET | Freq: Every day | ORAL | 0 refills | Status: DC

## 2019-01-21 NOTE — Progress Notes (Signed)
[facility] MD/PA/NP Initial Note  01/21/2019 6:02 PM [name]  MRN:  [id number]  Chief Complaint:  Chief Complaint    Depression; Anxiety; Panic Attack; ADHD      HPI:  Describes mood today as "so-so". Pleasant. Flat. Mood symptoms - reports depression, anxiety, and irritability. Stating "I've had to focus on my children for the past few years, and put myself on the back burner". Wanting to start "investing in herself". Has a "lot of trouble being out and around people". Feels like being at home during Covid-19 has made it worse. Has had times of having to leave stores from feeling so anxious. Having panic attacks. Feels like she worries "a lot". Has difficulties getting started on tasks if they are too "big". Husband started driving a truck a year ago and is only home a week out of the month. Had previously worked as a Engineer, structural. Having to manage household and sons while husband is away. Feels "overwhelmed". Decreased interest and motivation. Taking medications as prescribed.  Energy levels low. Active, does not have a regular exercise routine. Works full-time as a Geophysicist/field seismologist x 15 years.  Enjoys some usual interests and activities. Married. Lives husband of 76 years and 3 sons. Spending time with family - all local. Appetite adequate. Has periods of Binge eating. Weight gain.  Sleeping difficulties - "always". Averages 5 hours. Difficulties with focus and concentration. Struggled in high school. Had to have assistance to get work done. Continues to struggle in work setting. Completing tasks. Managing aspects of household.  Denies SI or HI. Denies AH or VH.  Diagnosed at 68 with depression and anxiety  Previous medications: Paxil, Pristiq, Prozac, Wellbutrin, Zoloft, Lexapro  Visit Diagnosis:    ICD-10-CM   1. Attention deficit hyperactivity disorder (ADHD), combined type  F90.2 amphetamine-dextroamphetamine (ADDERALL) 20 MG tablet  2. Generalized anxiety disorder  F41.1  vortioxetine HBr (TRINTELLIX) 10 MG TABS tablet  3. Major depressive disorder, recurrent episode, moderate (HCC)  F33.1 vortioxetine HBr (TRINTELLIX) 10 MG TABS tablet  4. Panic attacks  F41.0 vortioxetine HBr (TRINTELLIX) 10 MG TABS tablet    Past Psychiatric History: Denies psychiatric hospitalization.  Past Medical History:  Past Medical History:  Diagnosis Date  . Anxiety   . Asthma   . Depression   . History of shoulder surgery   . Hypothyroidism   . Obesity   . Plantar fasciitis 04/26/2013    Past Surgical History:  Procedure Laterality Date  . ABDOMINAL HYSTERECTOMY    . APPENDECTOMY    . CHOLECYSTECTOMY    . DILATION AND CURETTAGE OF UTERUS    . FRACTURE SURGERY    . LEG SURGERY Right   . SHOULDER SURGERY Right     Family Psychiatric History: Son 46 - has anxiety, OCD, depression and Asperger's. Jake 15 anxiety and depression, and mother with depression.  Family History:  Family History  Problem Relation Age of Onset  . Cervical cancer Mother   . Diabetes Father   . Hypertension Father   . Arthritis Maternal Grandmother   . Breast cancer Maternal Grandmother   . Prostate cancer Maternal Grandfather   . Anxiety disorder Son   . Asperger's syndrome Son   . OCD Son   . ADD / ADHD Son   . Depression Son     Social History:  Social History   Socioeconomic History  . Marital status: Married    Spouse name: Not on file  . Number of children: Not on file  .  Years of education: Not on file  . Highest education level: Not on file  Occupational History  . Not on file  Tobacco Use  . Smoking status: Never Smoker  . Smokeless tobacco: Never Used  Substance and Sexual Activity  . Alcohol use: Yes    Comment: Occasional  . Drug use: No  . Sexual activity: Yes    Birth control/protection: Surgical    Comment: hyst  Other Topics Concern  . Not on file  Social History Narrative  . Not on file   Social Determinants of Health   Financial Resource Strain:    . Difficulty of Paying Living Expenses: Not on file  Food Insecurity:   . Worried About Charity fundraiser in the Last Year: Not on file  . Ran Out of Food in the Last Year: Not on file  Transportation Needs:   . Lack of Transportation (Medical): Not on file  . Lack of Transportation (Non-Medical): Not on file  Physical Activity:   . Days of Exercise per Week: Not on file  . Minutes of Exercise per Session: Not on file  Stress:   . Feeling of Stress : Not on file  Social Connections:   . Frequency of Communication with Friends and Family: Not on file  . Frequency of Social Gatherings with Friends and Family: Not on file  . Attends Religious Services: Not on file  . Active Member of Clubs or Organizations: Not on file  . Attends Archivist Meetings: Not on file  . Marital Status: Not on file    Allergies:  Allergies  Allergen Reactions  . Codeine Nausea And Vomiting    Metabolic Disorder Labs: No results found for: HGBA1C, MPG No results found for: PROLACTIN No results found for: CHOL, TRIG, HDL, CHOLHDL, VLDL, LDLCALC Lab Results  Component Value Date   TSH 4.250 07/29/2016   TSH 3.641 04/26/2013    Therapeutic Level Labs: No results found for: LITHIUM No results found for: VALPROATE No components found for:  CBMZ  Current Medications: Current Outpatient Medications  Medication Sig Dispense Refill  . acetaminophen (TYLENOL) 500 MG tablet Take 500 mg by mouth every 6 (six) hours as needed.    Marland Kitchen albuterol (VENTOLIN HFA) 108 (90 Base) MCG/ACT inhaler     . amphetamine-dextroamphetamine (ADDERALL) 20 MG tablet Take 1 tablet (20 mg total) by mouth daily. 30 tablet 0  . Calcium Carbonate Antacid (TUMS PO) Take by mouth as needed. Reported on 07/04/2015    . ibuprofen (ADVIL,MOTRIN) 200 MG tablet Take 200 mg by mouth every 6 (six) hours as needed.    . meloxicam (MOBIC) 15 MG tablet Take 15 mg by mouth daily.    Marland Kitchen thyroid (NP THYROID) 90 MG tablet Take 90 mg by  mouth daily.    Marland Kitchen vortioxetine HBr (TRINTELLIX) 10 MG TABS tablet Take 1 tablet (10 mg total) by mouth daily. 28 tablet 0   No current facility-administered medications for this visit.    Medication Side Effects: none  Orders placed this visit:  No orders of the defined types were placed in this encounter.   Psychiatric Specialty Exam:  Review of Systems  Musculoskeletal: Negative for gait problem.  Neurological: Negative for tremors.  Psychiatric/Behavioral:       Please refer to HPI    Blood pressure 127/80, pulse 70, height 5\' 3"  (1.6 m), weight 290 lb (131.5 kg).Body mass index is 51.37 kg/m.  General Appearance: Neat and Well Groomed  Eye Contact:  Good  Speech:  Clear and Coherent and Normal Rate  Volume:  Normal  Mood:  Anxious, Depressed and Irritable  Affect:  Appropriate and Congruent  Thought Process:  Coherent and Descriptions of Associations: Intact  Orientation:  Full (Time, Place, and Person)  Thought Content: Logical   Suicidal Thoughts:  No  Homicidal Thoughts:  No  Memory:  WNL  Judgement:  Good  Insight:  Good  Psychomotor Activity:  Normal  Concentration:  Concentration: Good  Recall:  Good  Fund of Knowledge: Good  Language: Good  Assets:  Communication Skills Desire for Improvement Financial Resources/Insurance Housing Intimacy Leisure Time Physical Health Resilience Social Support Talents/Skills Transportation Vocational/Educational  ADL's:  Intact  Cognition: WNL  Prognosis:  Good   Screenings:  PHQ2-9     Office Visit from 07/29/2016 in [facility]  PHQ-2 Total Score  6  PHQ-9 Total Score  21      Receiving Psychotherapy: No   Treatment Plan/Recommendations:   Plan:  PDMP reviewed   1. Add Trintellix - 5mg  daily x 7, then increase to 10mg  daily - samples given. 2. Add Adderall 20mg  daily  Patient ADHD testing completed in office. Meets criteria for Adult ADHD.   RTC 4 weeks  Patient advised to contact office  with any questions, adverse effects, or acute worsening in signs and symptoms.  Discussed potential benefits, risks, and side effects of stimulants with patient to include increased heart rate, palpitations, insomnia, increased anxiety, increased irritability, or decreased appetite.  Instructed patient to contact office if experiencing any significant tolerability issues  Aloha Gell, NP

## 2019-02-01 ENCOUNTER — Telehealth: Payer: Self-pay | Admitting: Adult Health

## 2019-02-01 NOTE — Telephone Encounter (Signed)
Patient called and said that she want sto talk about medicine she takes with her adderall. She is having issues. She is having a tight chest and heart palaptions and trouble breathing. Please give her a call at [phone]

## 2019-02-01 NOTE — Telephone Encounter (Signed)
Have her stop the stimulant.

## 2019-02-01 NOTE — Telephone Encounter (Signed)
Patient advised to stop stimulant to see if her symptoms go away and to call back later in week with an update

## 2019-02-18 ENCOUNTER — Other Ambulatory Visit: Payer: Self-pay

## 2019-02-18 ENCOUNTER — Ambulatory Visit (INDEPENDENT_AMBULATORY_CARE_PROVIDER_SITE_OTHER): Payer: BC Managed Care – PPO | Admitting: Adult Health

## 2019-02-18 ENCOUNTER — Encounter: Payer: Self-pay | Admitting: Adult Health

## 2019-02-18 DIAGNOSIS — F411 Generalized anxiety disorder: Secondary | ICD-10-CM

## 2019-02-18 DIAGNOSIS — F410 Panic disorder [episodic paroxysmal anxiety] without agoraphobia: Secondary | ICD-10-CM | POA: Diagnosis not present

## 2019-02-18 DIAGNOSIS — F331 Major depressive disorder, recurrent, moderate: Secondary | ICD-10-CM | POA: Diagnosis not present

## 2019-02-18 DIAGNOSIS — F902 Attention-deficit hyperactivity disorder, combined type: Secondary | ICD-10-CM | POA: Diagnosis not present

## 2019-02-18 DIAGNOSIS — F319 Bipolar disorder, unspecified: Secondary | ICD-10-CM

## 2019-02-18 DIAGNOSIS — G4700 Insomnia, unspecified: Secondary | ICD-10-CM

## 2019-02-18 MED ORDER — LAMOTRIGINE 25 MG PO TABS: ORAL_TABLET | ORAL | 2 refills | Status: DC

## 2019-02-18 MED ORDER — AMPHETAMINE-DEXTROAMPHETAMINE 20 MG PO TABS: 20.0000 mg | ORAL_TABLET | Freq: Every day | ORAL | 0 refills | Status: DC

## 2019-02-18 NOTE — Progress Notes (Signed)
Shavaughn Wentworth [id number] [birth year] 44 y.o.  Subjective:   Patient ID:  [name] is a 44 y.o. (DOB 1975-03-27) female.  Chief Complaint: No chief complaint on file.   HPI   [name] presents to the office today for follow-up of MDD, GAD, ADHD, BPD-1, and panic attacks.    Describes mood today as "ok". Pleasant. Flat. Mood symptoms - reports depression, anxiety, and irritability. Has "anxiety" at night. More depressed overall. 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 Having periods of spending money that she "should not spend". Has lived pay check to pay check after having to pay off credit cards 4 times in our marriage. Feels like she is having "mood instability". Mind racing at night when she lays down - taking hours to get to sleep. Having racing thoughts - hearing music in the background - "in my mind". Stating "things are better than the were". Is tolerating the Adderall and is taking 10mg  BID. Husband driving a truck 3 weeks a month and home a week. Decreased interest and motivation. Taking medications as prescribed.  Energy levels low. Active, does not have a regular exercise routine. Works full-time Museum/gallery conservator x 15 years.  Enjoys some usual interests and activities. Married. Lives husband of 43 years and 3 sons. Spending time with family - all local. Appetite adequate. Weight gain. Has periods of Binge eating.  Sleeping difficulties. Averages 5 hours. Can stay up for a few days when working.  Difficulties with focus and concentration. Doing better in work setting. Completing tasks. Managing aspects of household.  Denies SI or HI. Denies AH or VH.  Previous medications: Paxil-OD, Pristiq, Prozac, Wellbutrin, Zoloft, Lexapro, Trintellix   PHQ2-9     Office Visit from 07/29/2016 in [facility]  PHQ-2 Total Score  6  PHQ-9 Total Score  21       Review of Systems:  Review of Systems  Musculoskeletal: Negative for gait  problem.  Neurological: Negative for tremors.  Psychiatric/Behavioral:       Please refer to HPI    Medications: I have reviewed the patient's current medications.  Current Outpatient Medications  Medication Sig Dispense Refill  . acetaminophen (TYLENOL) 500 MG tablet Take 500 mg by mouth every 6 (six) hours as needed.    Marland Kitchen albuterol (VENTOLIN HFA) 108 (90 Base) MCG/ACT inhaler     . amphetamine-dextroamphetamine (ADDERALL) 20 MG tablet Take 1 tablet (20 mg total) by mouth daily. 30 tablet 0  . Calcium Carbonate Antacid (TUMS PO) Take by mouth as needed. Reported on 07/04/2015    . ibuprofen (ADVIL,MOTRIN) 200 MG tablet Take 200 mg by mouth every 6 (six) hours as needed.    . meloxicam (MOBIC) 15 MG tablet Take 15 mg by mouth daily.    Marland Kitchen thyroid (NP THYROID) 90 MG tablet Take 90 mg by mouth daily.    Marland Kitchen vortioxetine HBr (TRINTELLIX) 10 MG TABS tablet Take 1 tablet (10 mg total) by mouth daily. 28 tablet 0   No current facility-administered medications for this visit.    Medication Side Effects: None  Allergies:  Allergies  Allergen Reactions  . Codeine Nausea And Vomiting    Past Medical History:  Diagnosis Date  . Anxiety   . Asthma   . Depression   . History of shoulder surgery   . Hypothyroidism   . Obesity   . Plantar fasciitis 04/26/2013    Family History  Problem Relation Age of Onset  . Cervical  cancer Mother   . Diabetes Father   . Hypertension Father   . Arthritis Maternal Grandmother   . Breast cancer Maternal Grandmother   . Prostate cancer Maternal Grandfather   . Anxiety disorder Son   . Asperger's syndrome Son   . OCD Son   . ADD / ADHD Son   . Depression Son     Social History   Socioeconomic History  . Marital status: Married    Spouse name: Not on file  . Number of children: Not on file  . Years of education: Not on file  . Highest education level: Not on file  Occupational History  . Not on file  Tobacco Use  . Smoking status: Never  Smoker  . Smokeless tobacco: Never Used  Substance and Sexual Activity  . Alcohol use: Yes    Comment: Occasional  . Drug use: No  . Sexual activity: Yes    Birth control/protection: Surgical    Comment: hyst  Other Topics Concern  . Not on file  Social History Narrative  . Not on file   Social Determinants of Health   Financial Resource Strain:   . Difficulty of Paying Living Expenses: Not on file  Food Insecurity:   . Worried About Charity fundraiser in the Last Year: Not on file  . Ran Out of Food in the Last Year: Not on file  Transportation Needs:   . Lack of Transportation (Medical): Not on file  . Lack of Transportation (Non-Medical): Not on file  Physical Activity:   . Days of Exercise per Week: Not on file  . Minutes of Exercise per Session: Not on file  Stress:   . Feeling of Stress : Not on file  Social Connections:   . Frequency of Communication with Friends and Family: Not on file  . Frequency of Social Gatherings with Friends and Family: Not on file  . Attends Religious Services: Not on file  . Active Member of Clubs or Organizations: Not on file  . Attends Archivist Meetings: Not on file  . Marital Status: Not on file  Intimate Partner Violence:   . Fear of Current or Ex-Partner: Not on file  . Emotionally Abused: Not on file  . Physically Abused: Not on file  . Sexually Abused: Not on file    Past Medical History, Surgical history, Social history, and Family history were reviewed and updated as appropriate.   Please see review of systems for further details on the patient's review from today.   Objective:   Physical Exam:  There were no vitals taken for this visit.  Physical Exam Constitutional:      General: She is not in acute distress.    Appearance: She is well-developed.  Musculoskeletal:        General: No deformity.  Neurological:     Mental Status: She is alert and oriented to person, place, and time.     Coordination:  Coordination normal.  Psychiatric:        Attention and Perception: Attention and perception normal. She does not perceive auditory or visual hallucinations.        Mood and Affect: Mood is depressed. Mood is not anxious. Affect is not labile, blunt, angry or inappropriate.        Speech: Speech normal.        Behavior: Behavior normal.        Thought Content: Thought content normal. Thought content is not paranoid or delusional. Thought  content does not include homicidal or suicidal ideation. Thought content does not include homicidal or suicidal plan.        Cognition and Memory: Cognition and memory normal.        Judgment: Judgment normal.     Comments: Insight intact     Lab Review:     Component Value Date/Time   NA 142 10/29/2007 2043   K 4.3 10/29/2007 2043   CL 102 10/29/2007 2043   GLUCOSE 97 10/29/2007 2043   BUN 13 10/29/2007 2043   CREATININE 0.9 10/29/2007 2043       Component Value Date/Time   HGB 14.3 10/29/2007 2043   HCT 42.0 10/29/2007 2043    No results found for: POCLITH, LITHIUM   No results found for: PHENYTOIN, PHENOBARB, VALPROATE, CBMZ   .res Assessment: Plan:    Plan:  PDMP reviewed   1. Trintellix 10mg  daily - d/c - diarrhea 2. Adderall 20mg  daily - 1/2 tab BID 3. Add Lamictal 25mg  daily x 14 days, then 50mg  daily  Consider SGA   Patient ADHD testing completed in office. Meets criteria for Adult ADHD.   RTC 4 weeks  Patient advised to contact office with any questions, adverse effects, or acute worsening in signs and symptoms.  Discussed potential benefits, risks, and side effects of stimulants with patient to include increased heart rate, palpitations, insomnia, increased anxiety, increased irritability, or decreased appetite.  Instructed patient to contact office if experiencing any significant tolerability issues.  Counseled patient regarding potential benefits, risks, and side effects of Lamictal to include potential risk of  Stevens-Johnson syndrome. Advised patient to stop taking Lamictal and contact office immediately if rash develops and to seek urgent medical attention if rash is severe and/or spreading quickly.  Diagnoses and all orders for this visit:  Attention deficit hyperactivity disorder (ADHD), combined type  Panic attacks  Major depressive disorder, recurrent episode, moderate ([place])  Generalized anxiety disorder  Bipolar I disorder ([place])     Please see After Visit Summary for patient specific instructions.  No future appointments.  No orders of the defined types were placed in this encounter.   -------------------------------

## 2019-02-24 ENCOUNTER — Telehealth: Payer: Self-pay

## 2019-02-24 NOTE — Telephone Encounter (Signed)
Prior authorization submitted and approved for Amphetamine-Dextroamphetamine 20 mg #30 effective 02/18/2019-02/17/2022 Through [facility]  Submitted through cover my meds

## 2019-02-25 ENCOUNTER — Telehealth: Payer: Self-pay | Admitting: Adult Health

## 2019-02-25 ENCOUNTER — Other Ambulatory Visit: Payer: Self-pay | Admitting: Adult Health

## 2019-02-25 DIAGNOSIS — G4700 Insomnia, unspecified: Secondary | ICD-10-CM

## 2019-02-25 MED ORDER — QUETIAPINE FUMARATE 25 MG PO TABS: ORAL_TABLET | ORAL | 2 refills | Status: DC

## 2019-02-25 NOTE — Telephone Encounter (Signed)
Pt stated she would like to try the sleep aid previously suggested. She has only had 7hrs of sleep this week. Please advise.

## 2019-02-25 NOTE — Telephone Encounter (Signed)
May need to stop the stimulant if not sleeping. Need to check with the timing of how she is taking it.

## 2019-02-25 NOTE — Telephone Encounter (Signed)
I sent in Seroquel 25mg  - 1 to 2 at hs for her to try.

## 2019-02-25 NOTE — Telephone Encounter (Signed)
May need to have a sleep study.

## 2019-03-18 ENCOUNTER — Other Ambulatory Visit (INDEPENDENT_AMBULATORY_CARE_PROVIDER_SITE_OTHER): Payer: Self-pay | Admitting: Internal Medicine

## 2019-03-18 ENCOUNTER — Other Ambulatory Visit: Payer: Self-pay

## 2019-03-18 ENCOUNTER — Ambulatory Visit (INDEPENDENT_AMBULATORY_CARE_PROVIDER_SITE_OTHER): Payer: BC Managed Care – PPO | Admitting: Adult Health

## 2019-03-18 ENCOUNTER — Encounter: Payer: Self-pay | Admitting: Adult Health

## 2019-03-18 DIAGNOSIS — F331 Major depressive disorder, recurrent, moderate: Secondary | ICD-10-CM

## 2019-03-18 DIAGNOSIS — F411 Generalized anxiety disorder: Secondary | ICD-10-CM

## 2019-03-18 DIAGNOSIS — F410 Panic disorder [episodic paroxysmal anxiety] without agoraphobia: Secondary | ICD-10-CM

## 2019-03-18 DIAGNOSIS — G4700 Insomnia, unspecified: Secondary | ICD-10-CM | POA: Diagnosis not present

## 2019-03-18 DIAGNOSIS — F902 Attention-deficit hyperactivity disorder, combined type: Secondary | ICD-10-CM | POA: Diagnosis not present

## 2019-03-18 DIAGNOSIS — F319 Bipolar disorder, unspecified: Secondary | ICD-10-CM

## 2019-03-18 MED ORDER — LAMOTRIGINE 100 MG PO TABS: ORAL_TABLET | ORAL | 2 refills | Status: DC

## 2019-03-18 MED ORDER — AMPHETAMINE-DEXTROAMPHETAMINE 30 MG PO TABS: 30.0000 mg | ORAL_TABLET | Freq: Every day | ORAL | 0 refills | Status: DC

## 2019-03-18 NOTE — Progress Notes (Signed)
[name] [id number] 04-Apr-1975 44 y.o.  Subjective:   Patient ID:  [name] is a 44 y.o. (DOB 20-Feb-1975) female.  Chief Complaint: No chief complaint on file.   HPI [name] presents to the office today for follow-up of MDD, GAD, ADHD, BPD-1, and panic attacks.     Describes mood today as "ok". Pleasant. Flat. Mood symptoms - reports depression, anxiety, and irritability. More depressed overall. Tolerating addition of Lamictal. Decreased anxiety at night - more anxious when has to leave the house. No recent "spending" - "avoiding the stores". Thoughts not racing as much since adding Seroquel. Washed her hair for the first time in 3 weeks. Getting out a few times a month. Working a little. 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 Mind racing at night has "calmed down". Had to put down dog of 20 years and hear 63 year old dog now has cancer. Husband driving a truck 3 weeks a month and home a week. Decreased interest and motivation. Taking medications as prescribed.  Energy levels low. Active, does not have a regular exercise routine. Works full-time Museum/gallery conservator x 15 years.  Enjoys some usual interests and activities. Married. Lives husband of 19 years and 3 sons - 2 at home. Spending time with family - all local. Appetite adequate. Weight gain. Continues to Binge eat.  Sleeping difficulties. Averages 7 to 8 hours. Addition of Seroquel has helped with sleep.  Difficulties with focus and concentration. Working "a little". Completing tasks. Managing aspects of household.  Denies SI or HI. Denies AH or VH.  Previous medications: Paxil-OD, Pristiq, Prozac, Wellbutrin, Zoloft, Lexapro, Trintellix   PHQ2-9     Office Visit from 07/29/2016 in [facility]  PHQ-2 Total Score  6  PHQ-9 Total Score  21       Review of Systems:  Review of Systems  Musculoskeletal: Negative for gait problem.  Neurological: Negative for tremors.  Psychiatric/Behavioral:       Please refer  to HPI    Medications: I have reviewed the patient's current medications.  Current Outpatient Medications  Medication Sig Dispense Refill  . acetaminophen (TYLENOL) 500 MG tablet Take 500 mg by mouth every 6 (six) hours as needed.    Marland Kitchen albuterol (VENTOLIN HFA) 108 (90 Base) MCG/ACT inhaler     . amphetamine-dextroamphetamine (ADDERALL) 20 MG tablet Take 1 tablet (20 mg total) by mouth daily. 30 tablet 0  . Calcium Carbonate Antacid (TUMS PO) Take by mouth as needed. Reported on 07/04/2015    . lamoTRIgine (LAMICTAL) 25 MG tablet Take one tablet at bedtime x 14 days, then take 50mg  at bedtime. 60 tablet 2  . meloxicam (MOBIC) 15 MG tablet Take 15 mg by mouth daily.    . QUEtiapine (SEROQUEL) 25 MG tablet Take one to two tablets at bedtime for sleep. 60 tablet 2  . thyroid (NP THYROID) 90 MG tablet Take 90 mg by mouth daily.     No current facility-administered medications for this visit.    Medication Side Effects: None  Allergies:  Allergies  Allergen Reactions  . Codeine Nausea And Vomiting    Past Medical History:  Diagnosis Date  . Anxiety   . Asthma   . Depression   . History of shoulder surgery   . Hypothyroidism   . Obesity   . Plantar fasciitis 04/26/2013    Family History  Problem Relation Age of Onset  . Cervical cancer Mother   . Diabetes Father   . Hypertension Father   .  Arthritis Maternal Grandmother   . Breast cancer Maternal Grandmother   . Prostate cancer Maternal Grandfather   . Anxiety disorder Son   . Asperger's syndrome Son   . OCD Son   . ADD / ADHD Son   . Depression Son     Social History   Socioeconomic History  . Marital status: Married    Spouse name: Not on file  . Number of children: Not on file  . Years of education: Not on file  . Highest education level: Not on file  Occupational History  . Not on file  Tobacco Use  . Smoking status: Never Smoker  . Smokeless tobacco: Never Used  Substance and Sexual Activity  . Alcohol  use: Yes    Comment: Occasional  . Drug use: No  . Sexual activity: Yes    Birth control/protection: Surgical    Comment: hyst  Other Topics Concern  . Not on file  Social History Narrative  . Not on file   Social Determinants of Health   Financial Resource Strain:   . Difficulty of Paying Living Expenses: Not on file  Food Insecurity:   . Worried About Charity fundraiser in the Last Year: Not on file  . Ran Out of Food in the Last Year: Not on file  Transportation Needs:   . Lack of Transportation (Medical): Not on file  . Lack of Transportation (Non-Medical): Not on file  Physical Activity:   . Days of Exercise per Week: Not on file  . Minutes of Exercise per Session: Not on file  Stress:   . Feeling of Stress : Not on file  Social Connections:   . Frequency of Communication with Friends and Family: Not on file  . Frequency of Social Gatherings with Friends and Family: Not on file  . Attends Religious Services: Not on file  . Active Member of Clubs or Organizations: Not on file  . Attends Archivist Meetings: Not on file  . Marital Status: Not on file  Intimate Partner Violence:   . Fear of Current or Ex-Partner: Not on file  . Emotionally Abused: Not on file  . Physically Abused: Not on file  . Sexually Abused: Not on file    Past Medical History, Surgical history, Social history, and Family history were reviewed and updated as appropriate.   Please see review of systems for further details on the patient's review from today.   Objective:   Physical Exam:  There were no vitals taken for this visit.  Physical Exam Constitutional:      General: She is not in acute distress. Musculoskeletal:        General: No deformity.  Neurological:     Mental Status: She is alert and oriented to person, place, and time.     Coordination: Coordination normal.  Psychiatric:        Attention and Perception: Attention and perception normal. She does not perceive  auditory or visual hallucinations.        Mood and Affect: Mood normal. Mood is not anxious or depressed. Affect is not labile, blunt, angry or inappropriate.        Speech: Speech normal.        Behavior: Behavior normal.        Thought Content: Thought content normal. Thought content is not paranoid or delusional. Thought content does not include homicidal or suicidal ideation. Thought content does not include homicidal or suicidal plan.  Cognition and Memory: Cognition and memory normal.        Judgment: Judgment normal.     Comments: Insight intact    Lab Review:     Component Value Date/Time   NA 142 10/29/2007 2043   K 4.3 10/29/2007 2043   CL 102 10/29/2007 2043   GLUCOSE 97 10/29/2007 2043   BUN 13 10/29/2007 2043   CREATININE 0.9 10/29/2007 2043       Component Value Date/Time   HGB 14.3 10/29/2007 2043   HCT 42.0 10/29/2007 2043    No results found for: POCLITH, LITHIUM   No results found for: PHENYTOIN, PHENOBARB, VALPROATE, CBMZ   .res Assessment: Plan:    Plan:  PDMP reviewed   1. Seroquel 25mg  1 to 2 at hs 2. Increase Adderall 20mg  to 30mg  daily 3. Increase Lamictal 50mg  daily to 100mg  daily  Consider SGA   Patient ADHD testing completed in office. Meets criteria for Adult ADHD.   RTC 4 weeks  Patient advised to contact office with any questions, adverse effects, or acute worsening in signs and symptoms.  Discussed potential benefits, risks, and side effects of stimulants with patient to include increased heart rate, palpitations, insomnia, increased anxiety, increased irritability, or decreased appetite.  Instructed patient to contact office if experiencing any significant tolerability issues.  Counseled patient regarding potential benefits, risks, and side effects of Lamictal to include potential risk of Stevens-Johnson syndrome. Advised patient to stop taking Lamictal and contact office immediately if rash develops and to seek urgent medical  attention if rash is severe and/or spreading quickly.  There are no diagnoses linked to this encounter.   Please see After Visit Summary for patient specific instructions.  Future Appointments  Date Time Provider [place]  03/18/2019  1:00 PM Doninique Lwin, Berdie Ogren, NP CP-CP None    No orders of the defined types were placed in this encounter.   -------------------------------

## 2019-04-15 ENCOUNTER — Other Ambulatory Visit: Payer: Self-pay

## 2019-04-15 ENCOUNTER — Ambulatory Visit (INDEPENDENT_AMBULATORY_CARE_PROVIDER_SITE_OTHER): Payer: BC Managed Care – PPO | Admitting: Adult Health

## 2019-04-15 ENCOUNTER — Encounter: Payer: Self-pay | Admitting: Adult Health

## 2019-04-15 ENCOUNTER — Other Ambulatory Visit (INDEPENDENT_AMBULATORY_CARE_PROVIDER_SITE_OTHER): Payer: Self-pay | Admitting: Internal Medicine

## 2019-04-15 DIAGNOSIS — F319 Bipolar disorder, unspecified: Secondary | ICD-10-CM | POA: Diagnosis not present

## 2019-04-15 DIAGNOSIS — F411 Generalized anxiety disorder: Secondary | ICD-10-CM

## 2019-04-15 DIAGNOSIS — F331 Major depressive disorder, recurrent, moderate: Secondary | ICD-10-CM

## 2019-04-15 DIAGNOSIS — G4700 Insomnia, unspecified: Secondary | ICD-10-CM | POA: Diagnosis not present

## 2019-04-15 DIAGNOSIS — F902 Attention-deficit hyperactivity disorder, combined type: Secondary | ICD-10-CM | POA: Diagnosis not present

## 2019-04-15 DIAGNOSIS — F410 Panic disorder [episodic paroxysmal anxiety] without agoraphobia: Secondary | ICD-10-CM

## 2019-04-15 MED ORDER — AMPHETAMINE-DEXTROAMPHETAMINE 30 MG PO TABS: 30.0000 mg | ORAL_TABLET | Freq: Every day | ORAL | 0 refills | Status: DC

## 2019-04-15 MED ORDER — FLUOXETINE HCL 10 MG PO CAPS: ORAL_CAPSULE | ORAL | 2 refills | Status: DC

## 2019-04-15 NOTE — Progress Notes (Signed)
[name] [id number] 04/04/1975 44 y.o.  Subjective:   Patient ID:  Purpose [name] is a 43 y.o. (DOB 1975/02/28) female.  Chief Complaint: No chief complaint on file.   HPI [name] presents to the office today for follow-up of MDD, GAD, ADHD, BPD-1, and panic attacks.   Describes mood today as "ok". Pleasant. Tearful at times. Mood symptoms - reports decreased depression. Feels like Lamictal has "really helped". Feels more anxious and irritable. Stating my OCD is "worse". Trying to get work done but can't "let go" of one thing. Spending more time on projects than she should. Performing self care more frequently. Put down 68 year old dog. Husband driving a truck 3 weeks a month and home a week. Improved interest and motivation. Taking medications as prescribed.  Energy levels stable - "ok". Active, does not have a regular exercise routine. Works full-time Museum/gallery conservator.  Enjoys some usual interests and activities. Married. Lives husband of 69 years and 3 sons - 2 at home. Spending time with family. Appetite adequate. Weight gain 12 pounds since last visit. Continues to "binge eat" at night.  Sleeping difficulties. Averages 7 to 8 hours with Seroquel.  Difficulties with focus and concentration at times - fixates on one thing. Completing tasks. Managing aspects of household. Work going well. Denies SI or HI. Denies AH or VH.  Previous medications: Paxil-OD, Pristiq, Prozac, Wellbutrin, Zoloft, Lexapro, Trintellix    PHQ2-9     Office Visit from 07/29/2016 in [facility]  PHQ-2 Total Score  6  PHQ-9 Total Score  21       Review of Systems:  Review of Systems  Musculoskeletal: Negative for gait problem.  Neurological: Negative for tremors.  Psychiatric/Behavioral:       Please refer to HPI    Medications: I have reviewed the patient's current medications.  Current Outpatient Medications  Medication Sig Dispense Refill  . acetaminophen (TYLENOL) 500 MG tablet Take 500  mg by mouth every 6 (six) hours as needed.    Marland Kitchen albuterol (VENTOLIN HFA) 108 (90 Base) MCG/ACT inhaler     . amphetamine-dextroamphetamine (ADDERALL) 30 MG tablet Take 1 tablet by mouth daily. 30 tablet 0  . Calcium Carbonate Antacid (TUMS PO) Take by mouth as needed. Reported on 07/04/2015    . FLUoxetine (PROZAC) 10 MG capsule Take one capsule daily x 7 days, then take two capsules daily. 60 capsule 2  . lamoTRIgine (LAMICTAL) 100 MG tablet Take one tablet at bedtime. 30 tablet 2  . meloxicam (MOBIC) 15 MG tablet Take 15 mg by mouth daily.    . QUEtiapine (SEROQUEL) 25 MG tablet Take one to two tablets at bedtime for sleep. 60 tablet 2  . thyroid (NP THYROID) 90 MG tablet Take 90 mg by mouth daily.     No current facility-administered medications for this visit.    Medication Side Effects: None  Allergies:  Allergies  Allergen Reactions  . Codeine Nausea And Vomiting    Past Medical History:  Diagnosis Date  . Anxiety   . Asthma   . Depression   . History of shoulder surgery   . Hypothyroidism   . Obesity   . Plantar fasciitis 04/26/2013    Family History  Problem Relation Age of Onset  . Cervical cancer Mother   . Diabetes Father   . Hypertension Father   . Arthritis Maternal Grandmother   . Breast cancer Maternal Grandmother   . Prostate cancer Maternal Grandfather   . Anxiety disorder  Son   . Asperger's syndrome Son   . OCD Son   . ADD / ADHD Son   . Depression Son     Social History   Socioeconomic History  . Marital status: Married    Spouse name: Not on file  . Number of children: Not on file  . Years of education: Not on file  . Highest education level: Not on file  Occupational History  . Not on file  Tobacco Use  . Smoking status: Never Smoker  . Smokeless tobacco: Never Used  Substance and Sexual Activity  . Alcohol use: Yes    Comment: Occasional  . Drug use: No  . Sexual activity: Yes    Birth control/protection: Surgical    Comment: hyst   Other Topics Concern  . Not on file  Social History Narrative  . Not on file   Social Determinants of Health   Financial Resource Strain:   . Difficulty of Paying Living Expenses:   Food Insecurity:   . Worried About Charity fundraiser in the Last Year:   . Arboriculturist in the Last Year:   Transportation Needs:   . Film/video editor (Medical):   Marland Kitchen Lack of Transportation (Non-Medical):   Physical Activity:   . Days of Exercise per Week:   . Minutes of Exercise per Session:   Stress:   . Feeling of Stress :   Social Connections:   . Frequency of Communication with Friends and Family:   . Frequency of Social Gatherings with Friends and Family:   . Attends Religious Services:   . Active Member of Clubs or Organizations:   . Attends Archivist Meetings:   Marland Kitchen Marital Status:   Intimate Partner Violence:   . Fear of Current or Ex-Partner:   . Emotionally Abused:   Marland Kitchen Physically Abused:   . Sexually Abused:     Past Medical History, Surgical history, Social history, and Family history were reviewed and updated as appropriate.   Please see review of systems for further details on the patient's review from today.   Objective:   Physical Exam:  There were no vitals taken for this visit.  Physical Exam Constitutional:      General: She is not in acute distress. Musculoskeletal:        General: No deformity.  Neurological:     Mental Status: She is alert and oriented to person, place, and time.     Coordination: Coordination normal.  Psychiatric:        Attention and Perception: Attention and perception normal. She does not perceive auditory or visual hallucinations.        Mood and Affect: Mood is not depressed. Affect is not labile, blunt, angry or inappropriate.        Speech: Speech normal.        Behavior: Behavior normal.        Thought Content: Thought content normal. Thought content is not paranoid or delusional. Thought content does not include  homicidal or suicidal ideation. Thought content does not include homicidal or suicidal plan.        Cognition and Memory: Cognition and memory normal.        Judgment: Judgment normal.     Comments: Insight intact     Lab Review:     Component Value Date/Time   NA 142 10/29/2007 2043   K 4.3 10/29/2007 2043   CL 102 10/29/2007 2043   GLUCOSE 97 10/29/2007  2043   BUN 13 10/29/2007 2043   CREATININE 0.9 10/29/2007 2043       Component Value Date/Time   HGB 14.3 10/29/2007 2043   HCT 42.0 10/29/2007 2043    No results found for: POCLITH, LITHIUM   No results found for: PHENYTOIN, PHENOBARB, VALPROATE, CBMZ   .res Assessment: Plan:    Plan:  PDMP reviewed   1. Seroquel 25mg  1 to 2 at hs 2. Adderall 30mg  daily 3. Lamictal 100mg  daily 4. Add Prozac 20mg  daily - 10mg  x 7, then 20mg   Consider SGA   Patient ADHD testing completed in office. Meets criteria for Adult ADHD.   RTC 4 weeks  Patient advised to contact office with any questions, adverse effects, or acute worsening in signs and symptoms.  Discussed potential benefits, risks, and side effects of stimulants with patient to include increased heart rate, palpitations, insomnia, increased anxiety, increased irritability, or decreased appetite.  Instructed patient to contact office if experiencing any significant tolerability issues.  Counseled patient regarding potential benefits, risks, and side effects of Lamictal to include potential risk of Stevens-Johnson syndrome. Advised patient to stop taking Lamictal and contact office immediately if rash develops and to seek urgent medical attention if rash is severe and/or spreading quickly.  Diagnoses and all orders for this visit:  Bipolar I disorder ([place])  Attention deficit hyperactivity disorder (ADHD), combined type -     amphetamine-dextroamphetamine (ADDERALL) 30 MG tablet; Take 1 tablet by mouth daily. -     FLUoxetine (PROZAC) 10 MG capsule; Take one capsule  daily x 7 days, then take two capsules daily.  Generalized anxiety disorder  Insomnia, unspecified type  Panic attacks  Major depressive disorder, recurrent episode, moderate ([place])     Please see After Visit Summary for patient specific instructions.  Future Appointments  Date Time Provider [place]  05/13/2019  1:00 PM Shante [place], Berdie Ogren, NP CP-CP None    No orders of the defined types were placed in this encounter.   -------------------------------

## 2019-05-13 ENCOUNTER — Ambulatory Visit (INDEPENDENT_AMBULATORY_CARE_PROVIDER_SITE_OTHER): Payer: BC Managed Care – PPO | Admitting: Adult Health

## 2019-05-13 ENCOUNTER — Encounter: Payer: Self-pay | Admitting: Adult Health

## 2019-05-13 ENCOUNTER — Other Ambulatory Visit: Payer: Self-pay

## 2019-05-13 DIAGNOSIS — F902 Attention-deficit hyperactivity disorder, combined type: Secondary | ICD-10-CM

## 2019-05-13 DIAGNOSIS — F410 Panic disorder [episodic paroxysmal anxiety] without agoraphobia: Secondary | ICD-10-CM

## 2019-05-13 DIAGNOSIS — F411 Generalized anxiety disorder: Secondary | ICD-10-CM | POA: Diagnosis not present

## 2019-05-13 DIAGNOSIS — F331 Major depressive disorder, recurrent, moderate: Secondary | ICD-10-CM

## 2019-05-13 DIAGNOSIS — G4700 Insomnia, unspecified: Secondary | ICD-10-CM

## 2019-05-13 DIAGNOSIS — F429 Obsessive-compulsive disorder, unspecified: Secondary | ICD-10-CM

## 2019-05-13 MED ORDER — AMPHETAMINE-DEXTROAMPHETAMINE 30 MG PO TABS: ORAL_TABLET | ORAL | 0 refills | Status: DC

## 2019-05-13 MED ORDER — FLUOXETINE HCL 40 MG PO CAPS: ORAL_CAPSULE | ORAL | 2 refills | Status: DC

## 2019-05-13 NOTE — Progress Notes (Signed)
[name] [id number] 1975/04/08 44 y.o.  Subjective:   Patient ID:  [name] is a 44 y.o. (DOB March 30, 1975) female.  Chief Complaint: No chief complaint on file.   HPI [name] presents to the office today for follow-up of MDD, GAD, ADHD, BPD-1, OCD, and panic attacks.   Describes mood today as "better". Pleasant. Denies tearfulness. Mood symptoms - denies depression. Feels  anxious and irritable "some of the time". Stating my OCD is "about the same as it was". Still staying "stuck" on projects. Taking longer to complete projects. Doing better with personal care. Washing hair once. Concerned about irregular heart rate. Improved interest and motivation. Taking medications as prescribed.  Energy levels "a little better". Has been able to clean her house. Active, does not have a regular exercise routine. Works full-time Museum/gallery conservator.  Enjoys some usual interests and activities. Married. Lives husband of 20 years and 3 sons - 2 at home. Spending time with family. Appetite adequate. Weight gain 20 pounds in a month and a half. Has started "binge eating" again. Sleep is "better". Getting to bed late. Averages 5 to 6 hours with Seroquel. Reports one sleepless night. Denies daytime napping.  Focus and concentration difficulties - "mostly all the time". Adderall has helped. Completing tasks. Managing aspects of household. Work going well. Denies SI or HI. Denies AH or VH.  Previous medications: Paxil-OD, Pristiq, Prozac, Wellbutrin, Zoloft, Lexapro, Trintellix   PHQ2-9     Office Visit from 07/29/2016 in [facility]  PHQ-2 Total Score  6  PHQ-9 Total Score  21       Review of Systems:  Review of Systems  Musculoskeletal: Negative for gait problem.  Neurological: Negative for tremors.  Psychiatric/Behavioral:       Please refer to HPI    Medications: I have reviewed the patient's current medications.  Current Outpatient Medications  Medication Sig Dispense Refill  .  acetaminophen (TYLENOL) 500 MG tablet Take 500 mg by mouth every 6 (six) hours as needed.    Marland Kitchen albuterol (VENTOLIN HFA) 108 (90 Base) MCG/ACT inhaler     . amphetamine-dextroamphetamine (ADDERALL) 30 MG tablet Take one tablet in the morning and 1/2 tablet at lunch. 45 tablet 0  . Calcium Carbonate Antacid (TUMS PO) Take by mouth as needed. Reported on 07/04/2015    . FLUoxetine (PROZAC) 40 MG capsule Take one capsule daily. 30 capsule 2  . lamoTRIgine (LAMICTAL) 100 MG tablet Take one tablet at bedtime. 30 tablet 2  . meloxicam (MOBIC) 15 MG tablet Take 15 mg by mouth daily.    . QUEtiapine (SEROQUEL) 25 MG tablet Take one to two tablets at bedtime for sleep. 60 tablet 2  . thyroid (NP THYROID) 90 MG tablet Take 90 mg by mouth daily.     No current facility-administered medications for this visit.    Medication Side Effects: None  Allergies:  Allergies  Allergen Reactions  . Codeine Nausea And Vomiting    Past Medical History:  Diagnosis Date  . Anxiety   . Asthma   . Depression   . History of shoulder surgery   . Hypothyroidism   . Obesity   . Plantar fasciitis 04/26/2013    Family History  Problem Relation Age of Onset  . Cervical cancer Mother   . Diabetes Father   . Hypertension Father   . Arthritis Maternal Grandmother   . Breast cancer Maternal Grandmother   . Prostate cancer Maternal Grandfather   . Anxiety disorder Son   .  Asperger's syndrome Son   . OCD Son   . ADD / ADHD Son   . Depression Son     Social History   Socioeconomic History  . Marital status: Married    Spouse name: Not on file  . Number of children: Not on file  . Years of education: Not on file  . Highest education level: Not on file  Occupational History  . Not on file  Tobacco Use  . Smoking status: Never Smoker  . Smokeless tobacco: Never Used  Substance and Sexual Activity  . Alcohol use: Yes    Comment: Occasional  . Drug use: No  . Sexual activity: Yes    Birth  control/protection: Surgical    Comment: hyst  Other Topics Concern  . Not on file  Social History Narrative  . Not on file   Social Determinants of Health   Financial Resource Strain:   . Difficulty of Paying Living Expenses:   Food Insecurity:   . Worried About Charity fundraiser in the Last Year:   . Arboriculturist in the Last Year:   Transportation Needs:   . Film/video editor (Medical):   Marland Kitchen Lack of Transportation (Non-Medical):   Physical Activity:   . Days of Exercise per Week:   . Minutes of Exercise per Session:   Stress:   . Feeling of Stress :   Social Connections:   . Frequency of Communication with Friends and Family:   . Frequency of Social Gatherings with Friends and Family:   . Attends Religious Services:   . Active Member of Clubs or Organizations:   . Attends Archivist Meetings:   Marland Kitchen Marital Status:   Intimate Partner Violence:   . Fear of Current or Ex-Partner:   . Emotionally Abused:   Marland Kitchen Physically Abused:   . Sexually Abused:     Past Medical History, Surgical history, Social history, and Family history were reviewed and updated as appropriate.   Please see review of systems for further details on the patient's review from today.   Objective:   Physical Exam:  There were no vitals taken for this visit.  Physical Exam Constitutional:      General: She is not in acute distress. Musculoskeletal:        General: No deformity.  Neurological:     Mental Status: She is alert and oriented to person, place, and time.     Coordination: Coordination normal.  Psychiatric:        Attention and Perception: Attention and perception normal. She does not perceive auditory or visual hallucinations.        Mood and Affect: Mood is anxious. Mood is not depressed. Affect is not labile, blunt, angry or inappropriate.        Speech: Speech normal.        Behavior: Behavior normal.        Thought Content: Thought content normal. Thought content  is not paranoid or delusional. Thought content does not include homicidal or suicidal ideation. Thought content does not include homicidal or suicidal plan.        Cognition and Memory: Cognition and memory normal.        Judgment: Judgment normal.     Comments: Insight intact     Lab Review:     Component Value Date/Time   NA 142 10/29/2007 2043   K 4.3 10/29/2007 2043   CL 102 10/29/2007 2043   GLUCOSE 97 10/29/2007 2043  BUN 13 10/29/2007 2043   CREATININE 0.9 10/29/2007 2043       Component Value Date/Time   HGB 14.3 10/29/2007 2043   HCT 42.0 10/29/2007 2043    No results found for: POCLITH, LITHIUM   No results found for: PHENYTOIN, PHENOBARB, VALPROATE, CBMZ   .res Assessment: Plan:    Plan:  PDMP reviewed   1. Seroquel 25mg  1 to 2 at hs 2. Adderall 30mg  daily - 1 and 1/2 tabs daily 3. Lamictal 100mg  daily 4. Increase Prozac 20mg  to 40mg   daily  Consider SGA   Patient ADHD testing completed in office - [facility]. Meets DSM-5 criteria for Adult ADHD.   RTC 4 weeks  Patient advised to contact office with any questions, adverse effects, or acute worsening in signs and symptoms.  Discussed potential benefits, risks, and side effects of stimulants with patient to include increased heart rate, palpitations, insomnia, increased anxiety, increased irritability, or decreased appetite.  Instructed patient to contact office if experiencing any significant tolerability issues.  Counseled patient regarding potential benefits, risks, and side effects of Lamictal to include potential risk of Stevens-Johnson syndrome. Advised patient to stop taking Lamictal and contact office immediately if rash develops and to seek urgent medical attention if rash is severe and/or spreading quickly.   Diagnoses and all orders for this visit:  Panic attacks  Attention deficit hyperactivity disorder (ADHD), combined type -     amphetamine-dextroamphetamine (ADDERALL) 30 MG tablet; Take  one tablet in the morning and 1/2 tablet at lunch. -     FLUoxetine (PROZAC) 40 MG capsule; Take one capsule daily.  Insomnia, unspecified type  Generalized anxiety disorder  Major depressive disorder, recurrent episode, moderate (HCC)  Obsessive-compulsive disorder, unspecified type     Please see After Visit Summary for patient specific instructions.  No future appointments.  No orders of the defined types were placed in this encounter.   -------------------------------

## 2019-06-10 ENCOUNTER — Ambulatory Visit (INDEPENDENT_AMBULATORY_CARE_PROVIDER_SITE_OTHER): Payer: BC Managed Care – PPO | Admitting: Adult Health

## 2019-06-10 ENCOUNTER — Encounter: Payer: Self-pay | Admitting: Adult Health

## 2019-06-10 DIAGNOSIS — F319 Bipolar disorder, unspecified: Secondary | ICD-10-CM

## 2019-06-10 DIAGNOSIS — F902 Attention-deficit hyperactivity disorder, combined type: Secondary | ICD-10-CM | POA: Diagnosis not present

## 2019-06-10 DIAGNOSIS — F429 Obsessive-compulsive disorder, unspecified: Secondary | ICD-10-CM | POA: Diagnosis not present

## 2019-06-10 DIAGNOSIS — F411 Generalized anxiety disorder: Secondary | ICD-10-CM

## 2019-06-10 DIAGNOSIS — G4700 Insomnia, unspecified: Secondary | ICD-10-CM

## 2019-06-10 MED ORDER — AMPHETAMINE-DEXTROAMPHETAMINE 30 MG PO TABS: ORAL_TABLET | ORAL | 0 refills | Status: DC

## 2019-06-10 MED ORDER — QUETIAPINE FUMARATE 25 MG PO TABS: ORAL_TABLET | ORAL | 2 refills | Status: DC

## 2019-06-10 MED ORDER — FLUOXETINE HCL 40 MG PO CAPS: ORAL_CAPSULE | ORAL | 2 refills | Status: DC

## 2019-06-10 MED ORDER — LAMOTRIGINE 100 MG PO TABS: ORAL_TABLET | ORAL | 2 refills | Status: DC

## 2019-06-10 NOTE — Progress Notes (Signed)
[name] [id number] Jun 12, 1975 44 y.o.  Subjective:   Patient ID:  [name] is a 44 y.o. (DOB 02-May-1975) female.  Chief Complaint: No chief complaint on file.   HPI [name] presents to the office today for follow-up of  Describes mood today as "ok". Pleasant. Mood symptoms - denies depression. Reporting some anxiety and irritability. Feels like OCD symptoms keep her from relaxing.  Does feel better with the increase in Prozac. Has felt more stressed lately - work related. 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 Staying up late at night. Plans to go to [place] day holiday. Son coming in from [employer] - being deployed soon. Stable interest and motivation. Taking medications as prescribed.  Energy levels stable. Active, does not have a regular exercise routine.  Enjoys some usual interests. Spending time with friends and families. Looking forward to family time this weekend.  Appetite increased at night. Weight gain - "not sure how much".  Sleeping difficulties lately - "only because I'm working late to meet deadlines". Typically averages 6 to 8 hours with Seroquel. Focus and concentration stable. Completing tasks. Managing aspects of household. Work going well Museum/gallery conservator. Denies SI or HI. Denies AH or VH.    PHQ2-9     Office Visit from 07/29/2016 in [facility]  PHQ-2 Total Score  6  PHQ-9 Total Score  21       Review of Systems:  Review of Systems  Musculoskeletal: Negative for gait problem.  Neurological: Negative for tremors.  Psychiatric/Behavioral:       Please refer to HPI    Medications: I have reviewed the patient's current medications.  Current Outpatient Medications  Medication Sig Dispense Refill  . acetaminophen (TYLENOL) 500 MG tablet Take 500 mg by mouth every 6 (six) hours as needed.    Marland Kitchen albuterol (VENTOLIN HFA) 108 (90 Base) MCG/ACT inhaler     . amphetamine-dextroamphetamine  (ADDERALL) 30 MG tablet Take one tablet in the morning and 1/2 tablet at lunch. 45 tablet 0  . [START ON 07/08/2019] amphetamine-dextroamphetamine (ADDERALL) 30 MG tablet Take one tablet in the morning and 1/2 tablet at lunch. 45 tablet 0  . Calcium Carbonate Antacid (TUMS PO) Take by mouth as needed. Reported on 07/04/2015    . FLUoxetine (PROZAC) 40 MG capsule Take one capsule daily. 30 capsule 2  . lamoTRIgine (LAMICTAL) 100 MG tablet Take one tablet at bedtime. 30 tablet 2  . meloxicam (MOBIC) 15 MG tablet Take 15 mg by mouth daily.    . QUEtiapine (SEROQUEL) 25 MG tablet Take one to two tablets at bedtime for sleep. 60 tablet 2  . thyroid (NP THYROID) 90 MG tablet Take 90 mg by mouth daily.     No current facility-administered medications for this visit.    Medication Side Effects: None  Allergies:  Allergies  Allergen Reactions  . Codeine Nausea And Vomiting    Past Medical History:  Diagnosis Date  . Anxiety   . Asthma   . Depression   . History of shoulder surgery   . Hypothyroidism   . Obesity   . Plantar fasciitis 04/26/2013    Family History  Problem Relation Age of Onset  . Cervical cancer Mother   . Diabetes Father   . Hypertension Father   . Arthritis Maternal Grandmother   . Breast cancer Maternal Grandmother   . Prostate cancer Maternal Grandfather   . Anxiety disorder Son   .  Asperger's syndrome Son   . OCD Son   . ADD / ADHD Son   . Depression Son     Social History   Socioeconomic History  . Marital status: Married    Spouse name: Not on file  . Number of children: Not on file  . Years of education: Not on file  . Highest education level: Not on file  Occupational History  . Not on file  Tobacco Use  . Smoking status: Never Smoker  . Smokeless tobacco: Never Used  Substance and Sexual Activity  . Alcohol use: Yes    Comment: Occasional  . Drug use: No  . Sexual activity: Yes    Birth control/protection: Surgical    Comment: hyst  Other  Topics Concern  . Not on file  Social History Narrative  . Not on file   Social Determinants of Health   Financial Resource Strain:   . Difficulty of Paying Living Expenses:   Food Insecurity:   . Worried About Charity fundraiser in the Last Year:   . Arboriculturist in the Last Year:   Transportation Needs:   . Film/video editor (Medical):   Marland Kitchen Lack of Transportation (Non-Medical):   Physical Activity:   . Days of Exercise per Week:   . Minutes of Exercise per Session:   Stress:   . Feeling of Stress :   Social Connections:   . Frequency of Communication with Friends and Family:   . Frequency of Social Gatherings with Friends and Family:   . Attends Religious Services:   . Active Member of Clubs or Organizations:   . Attends Archivist Meetings:   Marland Kitchen Marital Status:   Intimate Partner Violence:   . Fear of Current or Ex-Partner:   . Emotionally Abused:   Marland Kitchen Physically Abused:   . Sexually Abused:     Past Medical History, Surgical history, Social history, and Family history were reviewed and updated as appropriate.   Please see review of systems for further details on the patient's review from today.   Objective:   Physical Exam:  There were no vitals taken for this visit.  Physical Exam Constitutional:      General: She is not in acute distress. Musculoskeletal:        General: No deformity.  Neurological:     Mental Status: She is alert and oriented to person, place, and time.     Coordination: Coordination normal.  Psychiatric:        Attention and Perception: Attention and perception normal. She does not perceive auditory or visual hallucinations.        Mood and Affect: Mood normal. Mood is not anxious or depressed. Affect is not labile, blunt, angry or inappropriate.        Speech: Speech normal.        Behavior: Behavior normal.        Thought Content: Thought content normal. Thought content is not paranoid or delusional. Thought content  does not include homicidal or suicidal ideation. Thought content does not include homicidal or suicidal plan.        Cognition and Memory: Cognition and memory normal.        Judgment: Judgment normal.     Comments: Insight intact     Lab Review:     Component Value Date/Time   NA 142 10/29/2007 2043   K 4.3 10/29/2007 2043   CL 102 10/29/2007 2043   GLUCOSE 97 10/29/2007  2043   BUN 13 10/29/2007 2043   CREATININE 0.9 10/29/2007 2043       Component Value Date/Time   HGB 14.3 10/29/2007 2043   HCT 42.0 10/29/2007 2043    No results found for: POCLITH, LITHIUM   No results found for: PHENYTOIN, PHENOBARB, VALPROATE, CBMZ   .res Assessment: Plan:    Plan:  PDMP reviewed   1. Seroquel 25mg  1 to 2 at hs 2. Adderall 30mg  daily - 1 and 1/2 tabs daily 3. Lamictal 100mg  daily 4. Prozac 40mg   daily  Consider SGA   Patient ADHD testing completed in office - [facility]. Meets DSM-5 criteria for Adult ADHD.   RTC 8 weeks  Patient advised to contact office with any questions, adverse effects, or acute worsening in signs and symptoms.  Discussed potential benefits, risks, and side effects of stimulants with patient to include increased heart rate, palpitations, insomnia, increased anxiety, increased irritability, or decreased appetite.  Instructed patient to contact office if experiencing any significant tolerability issues.  Counseled patient regarding potential benefits, risks, and side effects of Lamictal to include potential risk of Stevens-Johnson syndrome. Advised patient to stop taking Lamictal and contact office immediately if rash develops and to seek urgent medical attention if rash is severe and/or spreading quickly.    Diagnoses and all orders for this visit:  Obsessive-compulsive disorder, unspecified type  Attention deficit hyperactivity disorder (ADHD), combined type -     amphetamine-dextroamphetamine (ADDERALL) 30 MG tablet; Take one tablet in the morning  and 1/2 tablet at lunch. -     amphetamine-dextroamphetamine (ADDERALL) 30 MG tablet; Take one tablet in the morning and 1/2 tablet at lunch.  Bipolar I disorder (HCC) -     lamoTRIgine (LAMICTAL) 100 MG tablet; Take one tablet at bedtime.  Insomnia, unspecified type -     QUEtiapine (SEROQUEL) 25 MG tablet; Take one to two tablets at bedtime for sleep.  Generalized anxiety disorder  Other orders -     FLUoxetine (PROZAC) 40 MG capsule; Take one capsule daily.     Please see After Visit Summary for patient specific instructions.  No future appointments.  No orders of the defined types were placed in this encounter.   -------------------------------

## 2019-08-05 ENCOUNTER — Ambulatory Visit (INDEPENDENT_AMBULATORY_CARE_PROVIDER_SITE_OTHER): Payer: BC Managed Care – PPO | Admitting: Adult Health

## 2019-08-05 ENCOUNTER — Encounter: Payer: Self-pay | Admitting: Adult Health

## 2019-08-05 ENCOUNTER — Other Ambulatory Visit: Payer: Self-pay

## 2019-08-05 DIAGNOSIS — F429 Obsessive-compulsive disorder, unspecified: Secondary | ICD-10-CM

## 2019-08-05 DIAGNOSIS — F410 Panic disorder [episodic paroxysmal anxiety] without agoraphobia: Secondary | ICD-10-CM

## 2019-08-05 DIAGNOSIS — G4700 Insomnia, unspecified: Secondary | ICD-10-CM

## 2019-08-05 DIAGNOSIS — F411 Generalized anxiety disorder: Secondary | ICD-10-CM | POA: Diagnosis not present

## 2019-08-05 DIAGNOSIS — F902 Attention-deficit hyperactivity disorder, combined type: Secondary | ICD-10-CM

## 2019-08-05 DIAGNOSIS — F319 Bipolar disorder, unspecified: Secondary | ICD-10-CM

## 2019-08-05 DIAGNOSIS — F331 Major depressive disorder, recurrent, moderate: Secondary | ICD-10-CM

## 2019-08-05 MED ORDER — AMPHETAMINE-DEXTROAMPHETAMINE 30 MG PO TABS: ORAL_TABLET | ORAL | 0 refills | Status: DC

## 2019-08-05 MED ORDER — FLUOXETINE HCL 40 MG PO CAPS: ORAL_CAPSULE | ORAL | 2 refills | Status: DC

## 2019-08-05 MED ORDER — LAMOTRIGINE 100 MG PO TABS: ORAL_TABLET | ORAL | 2 refills | Status: DC

## 2019-08-05 NOTE — Progress Notes (Signed)
[name] [id number] [birth year] 44 y.o.  Subjective:   Patient ID:  [name] is a 44 y.o. (DOB 04-20-1975) female.  Chief Complaint: No chief complaint on file.   HPI [name] presents to the office today for follow-up of ADHD, GAD, MDD, insomnia.    Describes mood today as "ok". Pleasant. Mood symptoms - denies depression, anxiety and irritability. Stating "I'm doing great". Has been in a "good" mood. 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 Feels symptoms may be related to "hormonal changes". Stable interest and motivation. Taking medications as prescribed.  Energy levels stable. Active, does not have a regular exercise routine. Walking.  Enjoys some usual interests and activities. Married. Lives husband of 15 years and 3 sons. Spending time with family - all local. Appetite decreased during the day. Wanting to eat late at night. Weight gain - 304 pounds. Sleeping difficulties. Getting to sleep, but not staying asleep. Typically averages 5 to 6 hours with Seroquel. Wanting to stop Seroquel. Missed taking Seroquel for 2/3 days and started having withdrawals - hands shaking, jerking, and scratching.   Focus and concentration stable. Completing tasks. Managing aspects of household. Work going well Museum/gallery conservator. Denies SI or HI. Denies AH or VH.    PHQ2-9     Office Visit from 07/29/2016 in [facility]  PHQ-2 Total Score 6  PHQ-9 Total Score 21       Review of Systems:  Review of Systems  Musculoskeletal: Negative for gait problem.  Neurological: Negative for tremors.  Psychiatric/Behavioral:       Please refer to HPI    Medications: I have reviewed the patient's current medications.  Current Outpatient Medications  Medication Sig Dispense Refill  . acetaminophen (TYLENOL) 500 MG tablet Take 500 mg by mouth every 6 (six) hours as needed.     Marland Kitchen albuterol (VENTOLIN HFA) 108 (90 Base) MCG/ACT inhaler     . amphetamine-dextroamphetamine (ADDERALL) 30 MG tablet Take one tablet in the morning and 1/2 tablet at lunch. 45 tablet 0  . [START ON 09/02/2019] amphetamine-dextroamphetamine (ADDERALL) 30 MG tablet Take one tablet in the morning and 1/2 tablet at lunch. 45 tablet 0  . Calcium Carbonate Antacid (TUMS PO) Take by mouth as needed. Reported on 07/04/2015    . FLUoxetine (PROZAC) 40 MG capsule Take one capsule daily. 30 capsule 2  . lamoTRIgine (LAMICTAL) 100 MG tablet Take one tablet at bedtime. 30 tablet 2  . meloxicam (MOBIC) 15 MG tablet Take 15 mg by mouth daily.    Marland Kitchen thyroid (NP THYROID) 90 MG tablet Take 90 mg by mouth daily.     No current facility-administered medications for this visit.    Medication Side Effects: None  Allergies:  Allergies  Allergen Reactions  . Codeine Nausea And Vomiting    Past Medical History:  Diagnosis Date  . Anxiety   . Asthma   . Depression   . History of shoulder surgery   . Hypothyroidism   . Obesity   . Plantar fasciitis 04/26/2013    Family History  Problem Relation Age of Onset  . Cervical cancer Mother   . Diabetes Father   . Hypertension Father   . Arthritis Maternal Grandmother   . Breast cancer Maternal Grandmother   . Prostate cancer Maternal Grandfather   . Anxiety disorder Son   . Asperger's syndrome Son   .  OCD Son   . ADD / ADHD Son   . Depression Son     Social History   Socioeconomic History  . Marital status: Married    Spouse name: Not on file  . Number of children: Not on file  . Years of education: Not on file  . Highest education level: Not on file  Occupational History  . Not on file  Tobacco Use  . Smoking status: Never Smoker  . Smokeless tobacco: Never Used  Substance and Sexual Activity  . Alcohol use: Yes    Comment: Occasional  . Drug use: No  . Sexual activity: Yes    Birth control/protection: Surgical    Comment: hyst  Other  Topics Concern  . Not on file  Social History Narrative  . Not on file   Social Determinants of Health   Financial Resource Strain:   . Difficulty of Paying Living Expenses:   Food Insecurity:   . Worried About Charity fundraiser in the Last Year:   . Arboriculturist in the Last Year:   Transportation Needs:   . Film/video editor (Medical):   Marland Kitchen Lack of Transportation (Non-Medical):   Physical Activity:   . Days of Exercise per Week:   . Minutes of Exercise per Session:   Stress:   . Feeling of Stress :   Social Connections:   . Frequency of Communication with Friends and Family:   . Frequency of Social Gatherings with Friends and Family:   . Attends Religious Services:   . Active Member of Clubs or Organizations:   . Attends Archivist Meetings:   Marland Kitchen Marital Status:   Intimate Partner Violence:   . Fear of Current or Ex-Partner:   . Emotionally Abused:   Marland Kitchen Physically Abused:   . Sexually Abused:     Past Medical History, Surgical history, Social history, and Family history were reviewed and updated as appropriate.   Please see review of systems for further details on the patient's review from today.   Objective:   Physical Exam:  There were no vitals taken for this visit.  Physical Exam Constitutional:      General: She is not in acute distress. Musculoskeletal:        General: No deformity.  Neurological:     Mental Status: She is alert and oriented to person, place, and time.     Coordination: Coordination normal.  Psychiatric:        Attention and Perception: Attention and perception normal. She does not perceive auditory or visual hallucinations.        Mood and Affect: Mood normal. Mood is not anxious or depressed. Affect is not labile, blunt, angry or inappropriate.        Speech: Speech normal.        Behavior: Behavior normal.        Thought Content: Thought content normal. Thought content is not paranoid or delusional. Thought content  does not include homicidal or suicidal ideation. Thought content does not include homicidal or suicidal plan.        Cognition and Memory: Cognition and memory normal.        Judgment: Judgment normal.     Comments: Insight intact     Lab Review:     Component Value Date/Time   NA 142 10/29/2007 2043   K 4.3 10/29/2007 2043   CL 102 10/29/2007 2043   GLUCOSE 97 10/29/2007 2043   BUN 13 10/29/2007  2043   CREATININE 0.9 10/29/2007 2043       Component Value Date/Time   HGB 14.3 10/29/2007 2043   HCT 42.0 10/29/2007 2043    No results found for: POCLITH, LITHIUM   No results found for: PHENYTOIN, PHENOBARB, VALPROATE, CBMZ   .res Assessment: Plan:    Plan:  PDMP reviewed   1. Seroquel 25mg  - 1 tablet at hs x 7 nights, then 1/2 tablet at hs x 7 days, then leave off 2. Adderall 30mg  daily - 1 and 1/2 tabs daily 3. Lamictal 100mg  daily 4. Prozac 40mg   Daily  Consider SGA   Patient ADHD testing completed in office - Conner's. Meets DSM-5 criteria for Adult ADHD.   RTC 8 weeks  Patient advised to contact office with any questions, adverse effects, or acute worsening in signs and symptoms.  Discussed potential benefits, risks, and side effects of stimulants with patient to include increased heart rate, palpitations, insomnia, increased anxiety, increased irritability, or decreased appetite.  Instructed patient to contact office if experiencing any significant tolerability issues.  Counseled patient regarding potential benefits, risks, and side effects of Lamictal to include potential risk of Stevens-Johnson syndrome. Advised patient to stop taking Lamictal and contact office immediately if rash develops and to seek urgent medical attention if rash is severe and/or spreading quickly.    Diagnoses and all orders for this visit:  Attention deficit hyperactivity disorder (ADHD), combined type -     amphetamine-dextroamphetamine (ADDERALL) 30 MG tablet; Take one tablet in  the morning and 1/2 tablet at lunch. -     amphetamine-dextroamphetamine (ADDERALL) 30 MG tablet; Take one tablet in the morning and 1/2 tablet at lunch.  Insomnia, unspecified type  Generalized anxiety disorder  Panic attacks  Major depressive disorder, recurrent episode, moderate (HCC)  Obsessive-compulsive disorder, unspecified type  Bipolar I disorder (HCC) -     lamoTRIgine (LAMICTAL) 100 MG tablet; Take one tablet at bedtime.  Other orders -     FLUoxetine (PROZAC) 40 MG capsule; Take one capsule daily.     Please see After Visit Summary for patient specific instructions.  No future appointments.  No orders of the defined types were placed in this encounter.   -------------------------------

## 2019-09-01 ENCOUNTER — Telehealth: Payer: Self-pay | Admitting: Adult Health

## 2019-09-01 NOTE — Telephone Encounter (Signed)
Aleisa called and LM. She said at her last visit, you discussed switching her to a different medicine at night?

## 2019-09-02 NOTE — Telephone Encounter (Signed)
Trazadone or temazepam.

## 2019-09-02 NOTE — Telephone Encounter (Signed)
LM with her options and to call back with pharmacy and if she has a preference.

## 2019-10-07 ENCOUNTER — Ambulatory Visit (INDEPENDENT_AMBULATORY_CARE_PROVIDER_SITE_OTHER): Payer: BC Managed Care – PPO | Admitting: Adult Health

## 2019-10-07 ENCOUNTER — Encounter: Payer: Self-pay | Admitting: Adult Health

## 2019-10-07 ENCOUNTER — Other Ambulatory Visit: Payer: Self-pay

## 2019-10-07 DIAGNOSIS — F411 Generalized anxiety disorder: Secondary | ICD-10-CM

## 2019-10-07 DIAGNOSIS — F410 Panic disorder [episodic paroxysmal anxiety] without agoraphobia: Secondary | ICD-10-CM | POA: Diagnosis not present

## 2019-10-07 DIAGNOSIS — F331 Major depressive disorder, recurrent, moderate: Secondary | ICD-10-CM

## 2019-10-07 DIAGNOSIS — F319 Bipolar disorder, unspecified: Secondary | ICD-10-CM

## 2019-10-07 DIAGNOSIS — G4700 Insomnia, unspecified: Secondary | ICD-10-CM

## 2019-10-07 DIAGNOSIS — F902 Attention-deficit hyperactivity disorder, combined type: Secondary | ICD-10-CM | POA: Diagnosis not present

## 2019-10-07 DIAGNOSIS — F429 Obsessive-compulsive disorder, unspecified: Secondary | ICD-10-CM

## 2019-10-07 MED ORDER — HYDROXYZINE HCL 25 MG PO TABS: ORAL_TABLET | ORAL | 2 refills | Status: DC

## 2019-10-07 MED ORDER — FLUOXETINE HCL 40 MG PO CAPS: ORAL_CAPSULE | ORAL | 2 refills | Status: DC

## 2019-10-07 MED ORDER — AMPHETAMINE-DEXTROAMPHETAMINE 30 MG PO TABS: ORAL_TABLET | ORAL | 0 refills | Status: DC

## 2019-10-07 MED ORDER — LAMOTRIGINE 100 MG PO TABS: ORAL_TABLET | ORAL | 2 refills | Status: DC

## 2019-10-07 NOTE — Progress Notes (Signed)
[name] [id number] 12-04-1975 44 y.o.  Subjective:   Patient ID:  [name] is a 44 y.o. (DOB 09-Jun-1975) female.  Chief Complaint: No chief complaint on file.   HPI [name] presents to the office today for follow-up of ADHD, GAD, MDD, insomnia.   Describes mood today as "ok". Pleasant. Mood symptoms - denies depression, anxiety and irritability. Stating "I'm doing alright". Increased "worry" about son. Stating "I'm not as up as I was". Son having mental health issues. Stable interest and motivation - "not as good as it was". Taking medications as prescribed.  Energy levels stable. Active, does not have a regular exercise routine. Walking.  Enjoys some usual interests and activities. MarNot ried. Lives husband of 27 years and 3 sons. Spending time with family - all local. Appetite stable. Not as much "binge eating" at night. Weight gain - 304 pounds. Sleeping difficulties. Typically averages 4 hours with Seroquel.  Focus and concentration stable. Completing tasks. Managing aspects of household. 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  Denies SI or HI. Denies AH or VH.    PHQ2-9     Office Visit from 07/29/2016 in [facility]  PHQ-2 Total Score 6  PHQ-9 Total Score 21       Review of Systems:  Review of Systems  Musculoskeletal: Negative for gait problem.  Neurological: Negative for tremors.  Psychiatric/Behavioral:       Please refer to HPI    Medications: I have reviewed the patient's current medications.  Current Outpatient Medications  Medication Sig Dispense Refill  . acetaminophen (TYLENOL) 500 MG tablet Take 500 mg by mouth every 6 (six) hours as needed.    Marland Kitchen albuterol (VENTOLIN HFA) 108 (90 Base) MCG/ACT inhaler     . amphetamine-dextroamphetamine (ADDERALL) 30 MG tablet Take one tablet in the morning and 1/2 tablet at lunch. 45 tablet 0  . [START ON 11/04/2019] amphetamine-dextroamphetamine (ADDERALL) 30 MG tablet Take  one tablet in the morning and 1/2 tablet at lunch. 45 tablet 0  . Calcium Carbonate Antacid (TUMS PO) Take by mouth as needed. Reported on 07/04/2015    . FLUoxetine (PROZAC) 40 MG capsule Take one capsule daily. 30 capsule 2  . hydrOXYzine (ATARAX/VISTARIL) 25 MG tablet Take one or two tablets at bedtime as needed for sleep. 60 tablet 2  . lamoTRIgine (LAMICTAL) 100 MG tablet Take one tablet at bedtime. 30 tablet 2  . meloxicam (MOBIC) 15 MG tablet Take 15 mg by mouth daily.    Marland Kitchen thyroid (NP THYROID) 90 MG tablet Take 90 mg by mouth daily.     No current facility-administered medications for this visit.    Medication Side Effects: None  Allergies:  Allergies  Allergen Reactions  . Codeine Nausea And Vomiting    Past Medical History:  Diagnosis Date  . Anxiety   . Asthma   . Depression   . History of shoulder surgery   . Hypothyroidism   . Obesity   . Plantar fasciitis 04/26/2013    Family History  Problem Relation Age of Onset  . Cervical cancer Mother   . Diabetes Father   . Hypertension Father   . Arthritis Maternal Grandmother   . Breast cancer Maternal Grandmother   . Prostate cancer Maternal Grandfather   . Anxiety disorder Son   . Asperger's syndrome Son   . OCD Son   . ADD / ADHD Son   . Depression Son     Social  History   Socioeconomic History  . Marital status: Married    Spouse name: Not on file  . Number of children: Not on file  . Years of education: Not on file  . Highest education level: Not on file  Occupational History  . Not on file  Tobacco Use  . Smoking status: Never Smoker  . Smokeless tobacco: Never Used  Substance and Sexual Activity  . Alcohol use: Yes    Comment: Occasional  . Drug use: No  . Sexual activity: Yes    Birth control/protection: Surgical    Comment: hyst  Other Topics Concern  . Not on file  Social History Narrative  . Not on file   Social Determinants of Health   Financial Resource Strain:   . Difficulty of  Paying Living Expenses: Not on file  Food Insecurity:   . Worried About Charity fundraiser in the Last Year: Not on file  . Ran Out of Food in the Last Year: Not on file  Transportation Needs:   . Lack of Transportation (Medical): Not on file  . Lack of Transportation (Non-Medical): Not on file  Physical Activity:   . Days of Exercise per Week: Not on file  . Minutes of Exercise per Session: Not on file  Stress:   . Feeling of Stress : Not on file  Social Connections:   . Frequency of Communication with Friends and Family: Not on file  . Frequency of Social Gatherings with Friends and Family: Not on file  . Attends Religious Services: Not on file  . Active Member of Clubs or Organizations: Not on file  . Attends Archivist Meetings: Not on file  . Marital Status: Not on file  Intimate Partner Violence:   . Fear of Current or Ex-Partner: Not on file  . Emotionally Abused: Not on file  . Physically Abused: Not on file  . Sexually Abused: Not on file    Past Medical History, Surgical history, Social history, and Family history were reviewed and updated as appropriate.   Please see review of systems for further details on the patient's review from today.   Objective:   Physical Exam:  There were no vitals taken for this visit.  Physical Exam Constitutional:      General: She is not in acute distress. Musculoskeletal:        General: No deformity.  Neurological:     Mental Status: She is alert and oriented to person, place, and time.     Coordination: Coordination normal.  Psychiatric:        Attention and Perception: Attention and perception normal. She does not perceive auditory or visual hallucinations.        Mood and Affect: Mood normal. Mood is not anxious or depressed. Affect is not labile, blunt, angry or inappropriate.        Speech: Speech normal.        Behavior: Behavior normal.        Thought Content: Thought content normal. Thought content is not  paranoid or delusional. Thought content does not include homicidal or suicidal ideation. Thought content does not include homicidal or suicidal plan.        Cognition and Memory: Cognition and memory normal.        Judgment: Judgment normal.     Comments: Insight intact     Lab Review:     Component Value Date/Time   NA 142 10/29/2007 2043   K 4.3 10/29/2007 2043  CL 102 10/29/2007 2043   GLUCOSE 97 10/29/2007 2043   BUN 13 10/29/2007 2043   CREATININE 0.9 10/29/2007 2043       Component Value Date/Time   HGB 14.3 10/29/2007 2043   HCT 42.0 10/29/2007 2043    No results found for: POCLITH, LITHIUM   No results found for: PHENYTOIN, PHENOBARB, VALPROATE, CBMZ   .res Assessment: Plan:    Plan:  PDMP reviewed   1. Prozac 40mg   Daily 2. Adderall 30mg  daily - 1 and 1/2 tabs daily 3. Lamictal 100mg  daily 4. Add Hydroxyzine 25mg  - 1 to 2 at hs.  Patient ADHD testing completed in office - Conner's. Meets DSM-5 criteria for Adult ADHD.   RTC 8 weeks  Patient advised to contact office with any questions, adverse effects, or acute worsening in signs and symptoms.  Discussed potential benefits, risks, and side effects of stimulants with patient to include increased heart rate, palpitations, insomnia, increased anxiety, increased irritability, or decreased appetite.  Instructed patient to contact office if experiencing any significant tolerability issues.  Counseled patient regarding potential benefits, risks, and side effects of Lamictal to include potential risk of Stevens-Johnson syndrome. Advised patient to stop taking Lamictal and contact office immediately if rash develops and to seek urgent medical attention if rash is severe and/or spreading quickly.   Diagnoses and all orders for this visit:  Attention deficit hyperactivity disorder (ADHD), combined type -     amphetamine-dextroamphetamine (ADDERALL) 30 MG tablet; Take one tablet in the morning and 1/2 tablet at  lunch. -     amphetamine-dextroamphetamine (ADDERALL) 30 MG tablet; Take one tablet in the morning and 1/2 tablet at lunch.  Panic attacks  Insomnia, unspecified type -     hydrOXYzine (ATARAX/VISTARIL) 25 MG tablet; Take one or two tablets at bedtime as needed for sleep.  Major depressive disorder, recurrent episode, moderate (HCC)  Generalized anxiety disorder  Obsessive-compulsive disorder, unspecified type  Bipolar I disorder (HCC) -     lamoTRIgine (LAMICTAL) 100 MG tablet; Take one tablet at bedtime.  Other orders -     FLUoxetine (PROZAC) 40 MG capsule; Take one capsule daily.     Please see After Visit Summary for patient specific instructions.  No future appointments.  No orders of the defined types were placed in this encounter.   -------------------------------

## 2019-11-04 ENCOUNTER — Ambulatory Visit: Payer: BC Managed Care – PPO | Admitting: Adult Health

## 2019-11-09 ENCOUNTER — Other Ambulatory Visit: Payer: Self-pay

## 2019-11-09 ENCOUNTER — Encounter: Payer: Self-pay | Admitting: Adult Health

## 2019-11-09 ENCOUNTER — Ambulatory Visit (INDEPENDENT_AMBULATORY_CARE_PROVIDER_SITE_OTHER): Payer: BC Managed Care – PPO | Admitting: Adult Health

## 2019-11-09 DIAGNOSIS — F429 Obsessive-compulsive disorder, unspecified: Secondary | ICD-10-CM | POA: Diagnosis not present

## 2019-11-09 DIAGNOSIS — F411 Generalized anxiety disorder: Secondary | ICD-10-CM

## 2019-11-09 DIAGNOSIS — F410 Panic disorder [episodic paroxysmal anxiety] without agoraphobia: Secondary | ICD-10-CM | POA: Diagnosis not present

## 2019-11-09 DIAGNOSIS — F902 Attention-deficit hyperactivity disorder, combined type: Secondary | ICD-10-CM | POA: Diagnosis not present

## 2019-11-09 DIAGNOSIS — F331 Major depressive disorder, recurrent, moderate: Secondary | ICD-10-CM

## 2019-11-09 DIAGNOSIS — G4700 Insomnia, unspecified: Secondary | ICD-10-CM | POA: Diagnosis not present

## 2019-11-09 MED ORDER — LAMOTRIGINE 150 MG PO TABS: ORAL_TABLET | ORAL | 5 refills | Status: DC

## 2019-11-09 MED ORDER — ZOLPIDEM TARTRATE 10 MG PO TABS: 10.0000 mg | ORAL_TABLET | Freq: Every evening | ORAL | 0 refills | Status: DC | PRN

## 2019-11-09 NOTE — Progress Notes (Signed)
[name] [id number] [birth year] 44 y.o.  Subjective:   Patient ID:  [name] is a 44 y.o. (DOB 11-10-1975) female.  Chief Complaint: No chief complaint on file.   HPI [name] presents to the office today for follow-up of ADHD, GAD, MDD, insomnia.   Describes mood today as "so-so". Pleasant. Mood symptoms - reports depression. Stating "I"m feeling a little bit down lately". Denies anxiety and irritability. Still having issues with sleep. Worried about son. Step mother recently diagnosed with Alzheimer's. Stable interest and motivation. Taking medications as prescribed.  Energy levels stable. Active, does not have a regular exercise routine. Walking. Enjoys some usual interests and activities. MarNot ried. Lives husband of 9 years and 3 sons. Spending time with family - all local. Appetite stable. Not as much "binge eating" at night. Weight gain - 304 pounds. Sleeping difficulties. Not getting sleep - "a few hours".    Focus and concentration stable. Completing tasks. Managing aspects of household. Work going well Museum/gallery conservator.  Denies SI or HI. Denies AH or VH.    PHQ2-9     Office Visit from 07/29/2016 in [facility]  PHQ-2 Total Score 6  PHQ-9 Total Score 21       Review of Systems:  Review of Systems  Musculoskeletal: Negative for gait problem.  Neurological: Negative for tremors.  Psychiatric/Behavioral:       Please refer to HPI    Medications: I have reviewed the patient's current medications.  Current Outpatient Medications  Medication Sig Dispense Refill  . albuterol (VENTOLIN HFA) 108 (90 Base) MCG/ACT inhaler     . amphetamine-dextroamphetamine (ADDERALL) 30 MG tablet Take one tablet in the morning and 1/2 tablet at lunch. 45 tablet 0  . amphetamine-dextroamphetamine (ADDERALL) 30 MG tablet Take one tablet in the morning and 1/2 tablet at lunch. 45 tablet 0  . FLUoxetine (PROZAC) 40 MG capsule Take one capsule daily. 30 capsule 2  . lamoTRIgine  (LAMICTAL) 150 MG tablet Take one tablet at bedtime. 30 tablet 5  . meloxicam (MOBIC) 15 MG tablet Take 15 mg by mouth daily.    Marland Kitchen zolpidem (AMBIEN) 10 MG tablet Take 1 tablet (10 mg total) by mouth at bedtime as needed for sleep. 30 tablet 0   No current facility-administered medications for this visit.    Medication Side Effects: None  Allergies:  Allergies  Allergen Reactions  . Codeine Nausea And Vomiting    Past Medical History:  Diagnosis Date  . Anxiety   . Asthma   . Depression   . History of shoulder surgery   . Hypothyroidism   . Obesity   . Plantar fasciitis 04/26/2013    Family History  Problem Relation Age of Onset  . Cervical cancer Mother   . Diabetes Father   . Hypertension Father   . Arthritis Maternal Grandmother   . Breast cancer Maternal Grandmother   . Prostate cancer Maternal Grandfather   . Anxiety disorder Son   . Asperger's syndrome Son   . OCD Son   . ADD / ADHD Son   . Depression Son     Social History   Socioeconomic History  . Marital status: Married    Spouse name: Not on file  . Number of children: Not on file  . Years of education: Not on file  . Highest education level: Not on file  Occupational History  . Not on file  Tobacco Use  . Smoking status: Never Smoker  . Smokeless tobacco: Never  Used  Substance and Sexual Activity  . Alcohol use: Yes    Comment: Occasional  . Drug use: No  . Sexual activity: Yes    Birth control/protection: Surgical    Comment: hyst  Other Topics Concern  . Not on file  Social History Narrative  . Not on file   Social Determinants of Health   Financial Resource Strain:   . Difficulty of Paying Living Expenses: Not on file  Food Insecurity:   . Worried About [place] in the Last Year: Not on file  . Ran Out of Food in the Last Year: Not on file  Transportation Needs:   . Lack of Transportation (Medical): Not on file  . Lack of Transportation (Non-Medical): Not on file   Physical Activity:   . Days of Exercise per Week: Not on file  . Minutes of Exercise per Session: Not on file  Stress:   . Feeling of Stress : Not on file  Social Connections:   . Frequency of Communication with Friends and Family: Not on file  . Frequency of Social Gatherings with Friends and Family: Not on file  . Attends Religious Services: Not on file  . Active Member of Clubs or Organizations: Not on file  . Attends Archivist Meetings: Not on file  . Marital Status: Not on file  Intimate Partner Violence:   . Fear of Current or Ex-Partner: Not on file  . Emotionally Abused: Not on file  . Physically Abused: Not on file  . Sexually Abused: Not on file    Past Medical History, Surgical history, Social history, and Family history were reviewed and updated as appropriate.   Please see review of systems for further details on the patient's review from today.   Objective:   Physical Exam:  There were no vitals taken for this visit.  Physical Exam Constitutional:      General: She is not in acute distress. Musculoskeletal:        General: No deformity.  Neurological:     Mental Status: She is alert and oriented to person, place, and time.     Coordination: Coordination normal.  Psychiatric:        Attention and Perception: Attention and perception normal. She does not perceive auditory or visual hallucinations.        Mood and Affect: Mood normal. Mood is not anxious or depressed. Affect is not labile, blunt, angry or inappropriate.        Speech: Speech normal.        Behavior: Behavior normal.        Thought Content: Thought content normal. Thought content is not paranoid or delusional. Thought content does not include homicidal or suicidal ideation. Thought content does not include homicidal or suicidal plan.        Cognition and Memory: Cognition and memory normal.        Judgment: Judgment normal.     Comments: Insight intact     Lab Review:      Component Value Date/Time   NA 142 10/29/2007 2043   K 4.3 10/29/2007 2043   CL 102 10/29/2007 2043   GLUCOSE 97 10/29/2007 2043   BUN 13 10/29/2007 2043   CREATININE 0.9 10/29/2007 2043       Component Value Date/Time   HGB 14.3 10/29/2007 2043   HCT 42.0 10/29/2007 2043    No results found for: POCLITH, LITHIUM   No results found for: PHENYTOIN, PHENOBARB, VALPROATE, CBMZ   .  res Assessment: Plan:    Plan:  PDMP reviewed   1. Prozac 40mg  daily 2. Adderall 30mg  daily - 1 and 1/2 tabs daily 3. Increase Lamictal 100mg  to 150mg  daily 4. D/C Hydroxyzine 25mg  - 1 to 2 at hs. 5. Add Ambien 10mg  at hs for sleep  Patient ADHD testing completed in office - Conner's. Meets DSM-5 criteria for Adult ADHD.   RTC 8 weeks  Patient advised to contact office with any questions, adverse effects, or acute worsening in signs and symptoms.  Discussed potential benefits, risks, and side effects of stimulants with patient to include increased heart rate, palpitations, insomnia, increased anxiety, increased irritability, or decreased appetite.  Instructed patient to contact office if experiencing any significant tolerability issues.  Counseled patient regarding potential benefits, risks, and side effects of Lamictal to include potential risk of Stevens-Johnson syndrome. Advised patient to stop taking Lamictal and contact office immediately if rash develops and to seek urgent medical attention if rash is severe and/or spreading quickly.    Diagnoses and all orders for this visit:  Attention deficit hyperactivity disorder (ADHD), combined type  Panic attacks  Insomnia, unspecified type -     zolpidem (AMBIEN) 10 MG tablet; Take 1 tablet (10 mg total) by mouth at bedtime as needed for sleep.  Obsessive-compulsive disorder, unspecified type  Generalized anxiety disorder  Major depressive disorder, recurrent episode, moderate (HCC) -     lamoTRIgine (LAMICTAL) 150 MG tablet; Take one  tablet at bedtime.     Please see After Visit Summary for patient specific instructions.  No future appointments.  No orders of the defined types were placed in this encounter.   -------------------------------

## 2019-12-10 ENCOUNTER — Other Ambulatory Visit: Payer: Self-pay | Admitting: Adult Health

## 2019-12-10 DIAGNOSIS — G4700 Insomnia, unspecified: Secondary | ICD-10-CM

## 2019-12-12 NOTE — Telephone Encounter (Signed)
Next apt 12/16/19

## 2019-12-16 ENCOUNTER — Ambulatory Visit (INDEPENDENT_AMBULATORY_CARE_PROVIDER_SITE_OTHER): Payer: BC Managed Care – PPO | Admitting: Adult Health

## 2019-12-16 ENCOUNTER — Encounter: Payer: Self-pay | Admitting: Adult Health

## 2019-12-16 ENCOUNTER — Other Ambulatory Visit: Payer: Self-pay

## 2019-12-16 VITALS — BP 108/81 | HR 81

## 2019-12-16 DIAGNOSIS — F429 Obsessive-compulsive disorder, unspecified: Secondary | ICD-10-CM | POA: Diagnosis not present

## 2019-12-16 DIAGNOSIS — F410 Panic disorder [episodic paroxysmal anxiety] without agoraphobia: Secondary | ICD-10-CM

## 2019-12-16 DIAGNOSIS — F319 Bipolar disorder, unspecified: Secondary | ICD-10-CM

## 2019-12-16 DIAGNOSIS — F331 Major depressive disorder, recurrent, moderate: Secondary | ICD-10-CM

## 2019-12-16 DIAGNOSIS — F902 Attention-deficit hyperactivity disorder, combined type: Secondary | ICD-10-CM

## 2019-12-16 DIAGNOSIS — G4700 Insomnia, unspecified: Secondary | ICD-10-CM

## 2019-12-16 DIAGNOSIS — F411 Generalized anxiety disorder: Secondary | ICD-10-CM

## 2019-12-16 MED ORDER — FLUOXETINE HCL 40 MG PO CAPS: ORAL_CAPSULE | ORAL | 2 refills | Status: DC

## 2019-12-16 MED ORDER — RISPERIDONE 1 MG PO TABS: ORAL_TABLET | ORAL | 2 refills | Status: DC

## 2019-12-16 MED ORDER — AMPHETAMINE-DEXTROAMPHETAMINE 30 MG PO TABS: ORAL_TABLET | ORAL | 0 refills | Status: DC

## 2019-12-16 NOTE — Progress Notes (Signed)
[name] [id number] 1975/05/15 44 y.o.  Subjective:   Patient ID:  [name] is a 44 y.o. (DOB 1975/06/12) female.  Chief Complaint: No chief complaint on file.   HPI [name] presents to the office today for follow-up of ADHD, GAD, MDD, insomnia.   Describes mood today as not too good. Pleasant. Mood symptoms - reports depression, anxiety, and irritability. Feels "frustrated". Stating "I have really plummeted this past month". Mood declined around the same time last year. Having panic attacks. Hasn't been working as much - "I could be working, I'm just putting it off". Not wanting to leave the house. Not sleeping well - "the Ambien did not help at all". Worried about son - "not raging". Step mother recently diagnosed with Alzheimer's. Stable interest and motivation. Taking medications as prescribed.  Energy levels decreased. Active, does not have a regular exercise routine.  Enjoys some usual interests and activities. Married. Lives husband of 35 years and 3 sons. Spending time with family. Appetite stable. Not "binge eating" as bad as she did last year. Weight stable - 304 pounds. Sleeping difficulties. Averages 3 to 5 hours. Stating "I'm not tired".  Focus and concentration stable. Completing tasks. Managing aspects of household. Work going well Museum/gallery conservator.  Denies SI or HI.  Denies AH or VH.   Previous medications: Seroquel-tremors, Wellbutrin   PHQ2-9     Office Visit from 07/29/2016 in [facility]  PHQ-2 Total Score 6  PHQ-9 Total Score 21       Review of Systems:  Review of Systems  Musculoskeletal: Negative for gait problem.  Neurological: Negative for tremors.  Psychiatric/Behavioral:       Please refer to HPI    Medications: I have reviewed the patient's current medications.  Current Outpatient Medications  Medication Sig Dispense Refill   albuterol (VENTOLIN HFA) 108 (90 Base) MCG/ACT inhaler      amphetamine-dextroamphetamine (ADDERALL) 30  MG tablet Take one tablet in the morning and 1/2 tablet at lunch. 45 tablet 0   [START ON 01/13/2020] amphetamine-dextroamphetamine (ADDERALL) 30 MG tablet Take one tablet in the morning and 1/2 tablet at lunch. 45 tablet 0   FLUoxetine (PROZAC) 40 MG capsule Take one capsule daily. 30 capsule 2   lamoTRIgine (LAMICTAL) 150 MG tablet Take one tablet at bedtime. 30 tablet 5   meloxicam (MOBIC) 15 MG tablet Take 15 mg by mouth daily.     risperiDONE (RISPERDAL) 1 MG tablet Take 1/2 tablet at bedtime x 7 nights, then take one tablet at bedtime. 30 tablet 2   zolpidem (AMBIEN) 10 MG tablet TAKE 1 TABLET(10 MG) BY MOUTH AT BEDTIME AS NEEDED FOR SLEEP 30 tablet 2   No current facility-administered medications for this visit.    Medication Side Effects: None  Allergies:  Allergies  Allergen Reactions   Codeine Nausea And Vomiting    Past Medical History:  Diagnosis Date   Anxiety    Asthma    Depression    History of shoulder surgery    Hypothyroidism    Obesity    Plantar fasciitis 04/26/2013    Family History  Problem Relation Age of Onset   Cervical cancer Mother    Diabetes Father    Hypertension Father    Arthritis Maternal Grandmother    Breast cancer Maternal Grandmother    Prostate cancer Maternal Grandfather    Anxiety disorder Son    Asperger's syndrome Son    OCD Son    ADD / ADHD Son  Depression Son     Social History   Socioeconomic History   Marital status: Married    Spouse name: Not on file   Number of children: Not on file   Years of education: Not on file   Highest education level: Not on file  Occupational History   Not on file  Tobacco Use   Smoking status: Never Smoker   Smokeless tobacco: Never Used  Substance and Sexual Activity   Alcohol use: Yes    Comment: Occasional   Drug use: No   Sexual activity: Yes    Birth control/protection: Surgical    Comment: hyst  Other Topics Concern   Not on file   Social History Narrative   Not on file   Social Determinants of Health   Financial Resource Strain:    Difficulty of Paying Living Expenses: Not on file  Food Insecurity:    Worried About [place] in the Last Year: Not on file   Ran Out of Food in the Last Year: Not on file  Transportation Needs:    Lack of Transportation (Medical): Not on file   Lack of Transportation (Non-Medical): Not on file  Physical Activity:    Days of Exercise per Week: Not on file   Minutes of Exercise per Session: Not on file  Stress:    Feeling of Stress : Not on file  Social Connections:    Frequency of Communication with Friends and Family: Not on file   Frequency of Social Gatherings with Friends and Family: Not on file   Attends Religious Services: Not on file   Active Member of Clubs or Organizations: Not on file   Attends Archivist Meetings: Not on file   Marital Status: Not on file  Intimate Partner Violence:    Fear of Current or Ex-Partner: Not on file   Emotionally Abused: Not on file   Physically Abused: Not on file   Sexually Abused: Not on file    Past Medical History, Surgical history, Social history, and Family history were reviewed and updated as appropriate.   Please see review of systems for further details on the patient's review from today.   Objective:   Physical Exam:  There were no vitals taken for this visit.  Physical Exam Constitutional:      General: She is not in acute distress. Musculoskeletal:        General: No deformity.  Neurological:     Mental Status: She is alert and oriented to person, place, and time.     Coordination: Coordination normal.  Psychiatric:        Attention and Perception: Attention and perception normal. She does not perceive auditory or visual hallucinations.        Mood and Affect: Mood is anxious and depressed. Affect is not labile, blunt, angry or inappropriate.        Speech: Speech  normal.        Behavior: Behavior normal.        Thought Content: Thought content normal. Thought content is not paranoid or delusional. Thought content does not include homicidal or suicidal ideation. Thought content does not include homicidal or suicidal plan.        Cognition and Memory: Cognition and memory normal.        Judgment: Judgment normal.     Comments: Insight intact     Lab Review:     Component Value Date/Time   NA 142 10/29/2007 2043  K 4.3 10/29/2007 2043   CL 102 10/29/2007 2043   GLUCOSE 97 10/29/2007 2043   BUN 13 10/29/2007 2043   CREATININE 0.9 10/29/2007 2043       Component Value Date/Time   HGB 14.3 10/29/2007 2043   HCT 42.0 10/29/2007 2043    No results found for: POCLITH, LITHIUM   No results found for: PHENYTOIN, PHENOBARB, VALPROATE, CBMZ   .res Assessment: Plan:    Plan:  PDMP reviewed   1. Prozac 40mg  daily 2. Adderall 30mg  daily - 1 and 1/2 tabs daily 3. Lamictal 100mg  to 150mg  daily 4. D/C Hydroxyzine 25mg  - 1 to 2 at hs. 5. D/C Ambien 10mg  at hs for sleep 6. Add Risperdal 1 mg - 1/2 tab daily x 7, then one tablet daily.  Patient ADHD testing completed in office - Conner's. Meets DSM-5 criteria for Adult ADHD.   RTC 8 weeks  Patient advised to contact office with any questions, adverse effects, or acute worsening in signs and symptoms.  Discussed potential benefits, risks, and side effects of stimulants with patient to include increased heart rate, palpitations, insomnia, increased anxiety, increased irritability, or decreased appetite.  Instructed patient to contact office if experiencing any significant tolerability issues.  Counseled patient regarding potential benefits, risks, and side effects of Lamictal to include potential risk of Stevens-Johnson syndrome. Advised patient to stop taking Lamictal and contact office immediately if rash develops and to seek urgent medical attention if rash is severe and/or spreading  quickly.    Diagnoses and all orders for this visit:  Obsessive-compulsive disorder, unspecified type -     FLUoxetine (PROZAC) 40 MG capsule; Take one capsule daily.  Attention deficit hyperactivity disorder (ADHD), combined type -     amphetamine-dextroamphetamine (ADDERALL) 30 MG tablet; Take one tablet in the morning and 1/2 tablet at lunch. -     amphetamine-dextroamphetamine (ADDERALL) 30 MG tablet; Take one tablet in the morning and 1/2 tablet at lunch.  Major depressive disorder, recurrent episode, moderate (HCC) -     risperiDONE (RISPERDAL) 1 MG tablet; Take 1/2 tablet at bedtime x 7 nights, then take one tablet at bedtime.  Bipolar I disorder (HCC) -     risperiDONE (RISPERDAL) 1 MG tablet; Take 1/2 tablet at bedtime x 7 nights, then take one tablet at bedtime.  Panic attacks  Generalized anxiety disorder -     amphetamine-dextroamphetamine (ADDERALL) 30 MG tablet; Take one tablet in the morning and 1/2 tablet at lunch.  Insomnia, unspecified type     Please see After Visit Summary for patient specific instructions.  No future appointments.  No orders of the defined types were placed in this encounter.   -------------------------------

## 2020-01-03 ENCOUNTER — Other Ambulatory Visit: Payer: Self-pay

## 2020-01-03 ENCOUNTER — Ambulatory Visit
Admission: EM | Admit: 2020-01-03 | Discharge: 2020-01-03 | Disposition: A | Discharge status: Home / Self Care | Payer: BC Managed Care – PPO | Attending: Urgent Care | Admitting: Urgent Care

## 2020-01-03 DIAGNOSIS — T22211A Burn of second degree of right forearm, initial encounter: Secondary | ICD-10-CM

## 2020-01-03 DIAGNOSIS — M79601 Pain in right arm: Secondary | ICD-10-CM | POA: Diagnosis not present

## 2020-01-03 DIAGNOSIS — Z23 Encounter for immunization: Secondary | ICD-10-CM | POA: Diagnosis not present

## 2020-01-03 MED ORDER — SILVER SULFADIAZINE 1 % EX CREA: 1.0000 "application " | TOPICAL_CREAM | Freq: Every day | CUTANEOUS | 0 refills | Status: DC

## 2020-01-03 MED ORDER — TETANUS-DIPHTH-ACELL PERTUSSIS 5-2.5-18.5 LF-MCG/0.5 IM SUSY
0.5000 mL | PREFILLED_SYRINGE | Freq: Once | INTRAMUSCULAR | Status: AC
  Administered 2020-01-03: 0.5 mL via INTRAMUSCULAR

## 2020-01-03 NOTE — Discharge Instructions (Signed)
Please change your dressing 2-3 times daily. Apply silvadene cream generously and secure with a non-stick dressing. Each time you change your dressing, make sure you clean gently around the perimeter of the wound with gentle soap and warm water. Do not peel off any dead skin. If it comes off in the process of washing your wound or removing the dressing, that's okay. Pat your wound dry and let it air out if possible for an hour before reapplying another dressing.
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
## 2020-01-04 ENCOUNTER — Other Ambulatory Visit: Payer: Self-pay | Admitting: Adult Health

## 2020-01-04 DIAGNOSIS — F429 Obsessive-compulsive disorder, unspecified: Secondary | ICD-10-CM

## 2020-01-20 ENCOUNTER — Encounter: Payer: Self-pay | Admitting: Adult Health

## 2020-01-20 ENCOUNTER — Ambulatory Visit (INDEPENDENT_AMBULATORY_CARE_PROVIDER_SITE_OTHER): Payer: BC Managed Care – PPO | Admitting: Adult Health

## 2020-01-20 ENCOUNTER — Other Ambulatory Visit: Payer: Self-pay

## 2020-01-20 DIAGNOSIS — F410 Panic disorder [episodic paroxysmal anxiety] without agoraphobia: Secondary | ICD-10-CM

## 2020-01-20 DIAGNOSIS — F902 Attention-deficit hyperactivity disorder, combined type: Secondary | ICD-10-CM | POA: Diagnosis not present

## 2020-01-20 DIAGNOSIS — F319 Bipolar disorder, unspecified: Secondary | ICD-10-CM

## 2020-01-20 DIAGNOSIS — G4700 Insomnia, unspecified: Secondary | ICD-10-CM

## 2020-01-20 DIAGNOSIS — F429 Obsessive-compulsive disorder, unspecified: Secondary | ICD-10-CM | POA: Diagnosis not present

## 2020-01-20 DIAGNOSIS — F331 Major depressive disorder, recurrent, moderate: Secondary | ICD-10-CM

## 2020-01-20 DIAGNOSIS — F411 Generalized anxiety disorder: Secondary | ICD-10-CM

## 2020-01-20 MED ORDER — CLONAZEPAM 1 MG PO TABS: 1.0000 mg | ORAL_TABLET | Freq: Every day | ORAL | 2 refills | Status: DC

## 2020-01-20 NOTE — Progress Notes (Signed)
[name] [id number] 07-06-1975 45 y.o.  Subjective:   Patient ID:  [name] is a 45 y.o. (DOB [birth year]) female.  Chief Complaint: No chief complaint on file.   HPI Viana Sleep presents to the office today for follow-up of ADHD, GAD, MDD, insomnia.   Describes mood today as "ok". Pleasant. Mood symptoms - reports decreased depression, anxiety, and irritability. Decreased panic attacks - "not a lot".  Stating "I feel better than I did last month". Still working a decreased schedule. Mostly staying home. Did not tolerate the Risperdal - "restless". Continues to have issues sleeping. Willing to try a different medication. Improved interest and motivation. Taking medications as prescribed.  Energy levels decreased. Active, does not have a regular exercise routine.  Enjoys some usual interests and activities. Married. Lives husband of 21 years and 3 sons. Spending time with family. Appetite stable - "binge eating". Weight stable - 304 pounds. Sleeping difficulties. Averages 3 to 5 hours.  Focus and concentration stable. Completing tasks. Managing aspects of household. Work as a Environmental manager.  Denies SI or HI.  Denies AH or VH.   Previous medications: Seroquel-tremors, Wellbutrin     [employer]   Flowsheet Row Office Visit from 07/29/2016 in [facility]  PHQ-2 Total Score 6  PHQ-9 Total Score 21       Review of Systems:  Review of Systems  Musculoskeletal: Negative for gait problem.  Neurological: Negative for tremors.  Psychiatric/Behavioral:       Please refer to HPI    Medications: I have reviewed the patient's current medications.  Current Outpatient Medications  Medication Sig Dispense Refill  . clonazePAM (KLONOPIN) 1 MG tablet Take 1 tablet (1 mg total) by mouth at bedtime. 30 tablet 2  . albuterol (VENTOLIN HFA) 108 (90 Base) MCG/ACT inhaler     . amphetamine-dextroamphetamine (ADDERALL) 30 MG tablet Take one tablet in the morning and 1/2 tablet at lunch. 45  tablet 0  . amphetamine-dextroamphetamine (ADDERALL) 30 MG tablet Take one tablet in the morning and 1/2 tablet at lunch. 45 tablet 0  . FLUoxetine (PROZAC) 40 MG capsule TAKE 1 CAPSULE BY MOUTH DAILY 30 capsule 2  . lamoTRIgine (LAMICTAL) 150 MG tablet Take one tablet at bedtime. 30 tablet 5  . meloxicam (MOBIC) 15 MG tablet Take 15 mg by mouth daily.    . risperiDONE (RISPERDAL) 1 MG tablet Take 1/2 tablet at bedtime x 7 nights, then take one tablet at bedtime. 30 tablet 2  . silver sulfADIAZINE (SILVADENE) 1 % cream Apply 1 application topically daily. 100 g 0  . zolpidem (AMBIEN) 10 MG tablet TAKE 1 TABLET(10 MG) BY MOUTH AT BEDTIME AS NEEDED FOR SLEEP 30 tablet 2   No current facility-administered medications for this visit.    Medication Side Effects: None  Allergies:  Allergies  Allergen Reactions  . Codeine Nausea And Vomiting    Past Medical History:  Diagnosis Date  . Anxiety   . Asthma   . Depression   . History of shoulder surgery   . Hypothyroidism   . Obesity   . Plantar fasciitis 04/26/2013    Family History  Problem Relation Age of Onset  . Cervical cancer Mother   . Diabetes Father   . Hypertension Father   . Arthritis Maternal Grandmother   . Breast cancer Maternal Grandmother   . Prostate cancer Maternal Grandfather   . Anxiety disorder Son   . Asperger's syndrome Son   . OCD Son   . ADD /  ADHD Son   . Depression Son     Social History   Socioeconomic History  . Marital status: Married    Spouse name: Not on file  . Number of children: Not on file  . Years of education: Not on file  . Highest education level: Not on file  Occupational History  . Not on file  Tobacco Use  . Smoking status: Never Smoker  . Smokeless tobacco: Never Used  Substance and Sexual Activity  . Alcohol use: Yes    Comment: Occasional  . Drug use: No  . Sexual activity: Yes    Birth control/protection: Surgical    Comment: hyst  Other Topics Concern  . Not on  file  Social History Narrative  . Not on file   Social Determinants of Health   Financial Resource Strain: Not on file  Food Insecurity: Not on file  Transportation Needs: Not on file  Physical Activity: Not on file  Stress: Not on file  Social Connections: Not on file  Intimate Partner Violence: Not on file    Past Medical History, Surgical history, Social history, and Family history were reviewed and updated as appropriate.   Please see review of systems for further details on the patient's review from today.   Objective:   Physical Exam:  There were no vitals taken for this visit.  Physical Exam Constitutional:      General: She is not in acute distress. Musculoskeletal:        General: No deformity.  Neurological:     Mental Status: She is alert and oriented to person, place, and time.     Coordination: Coordination normal.  Psychiatric:        Attention and Perception: Attention and perception normal. She does not perceive auditory or visual hallucinations.        Mood and Affect: Mood normal. Mood is not anxious or depressed. Affect is not labile, blunt, angry or inappropriate.        Speech: Speech normal.        Behavior: Behavior normal.        Thought Content: Thought content normal. Thought content is not paranoid or delusional. Thought content does not include homicidal or suicidal ideation. Thought content does not include homicidal or suicidal plan.        Cognition and Memory: Cognition and memory normal.        Judgment: Judgment normal.     Comments: Insight intact     Lab Review:     Component Value Date/Time   NA 142 10/29/2007 2043   K 4.3 10/29/2007 2043   CL 102 10/29/2007 2043   GLUCOSE 97 10/29/2007 2043   BUN 13 10/29/2007 2043   CREATININE 0.9 10/29/2007 2043       Component Value Date/Time   HGB 14.3 10/29/2007 2043   HCT 42.0 10/29/2007 2043    No results found for: POCLITH, LITHIUM   No results found for: PHENYTOIN,  PHENOBARB, VALPROATE, CBMZ   .res Assessment: Plan:     Plan:  PDMP reviewed   1. Prozac 40mg  daily 2. Adderall 30mg  daily - 1 and 1/2 tabs daily 3. Lamictal 150mg  daily 4. Add Clonazepam 1mg  - 1/2 to 1 at hs for sleep.  Patient ADHD testing completed in office - [facility]. Meets DSM-5 criteria for Adult ADHD.   RTC 8 weeks  Patient advised to contact office with any questions, adverse effects, or acute worsening in signs and symptoms.  Discussed potential benefits,  risks, and side effects of stimulants with patient to include increased heart rate, palpitations, insomnia, increased anxiety, increased irritability, or decreased appetite.  Instructed patient to contact office if experiencing any significant tolerability issues.  Counseled patient regarding potential benefits, risks, and side effects of Lamictal to include potential risk of Stevens-Johnson syndrome. Advised patient to stop taking Lamictal and contact office immediately if rash develops and to seek urgent medical attention if rash is severe and/or spreading quickly.    Diagnoses and all orders for this visit:  Generalized anxiety disorder -     clonazePAM (KLONOPIN) 1 MG tablet; Take 1 tablet (1 mg total) by mouth at bedtime.  Obsessive-compulsive disorder, unspecified type  Attention deficit hyperactivity disorder (ADHD), combined type  Major depressive disorder, recurrent episode, moderate (HCC)  Panic attacks  Bipolar I disorder ([place])  Insomnia, unspecified type     Please see After Visit Summary for patient specific instructions.  No future appointments.  No orders of the defined types were placed in this encounter.   -------------------------------

## 2020-02-09 ENCOUNTER — Inpatient Hospital Stay (HOSPITAL_COMMUNITY): Admission: RE | Admit: 2020-02-09 | Payer: BC Managed Care – PPO | Source: Ambulatory Visit

## 2020-02-09 DIAGNOSIS — Z1231 Encounter for screening mammogram for malignant neoplasm of breast: Secondary | ICD-10-CM

## 2020-02-17 ENCOUNTER — Encounter: Payer: Self-pay | Admitting: Adult Health

## 2020-02-17 ENCOUNTER — Other Ambulatory Visit: Payer: Self-pay

## 2020-02-17 ENCOUNTER — Ambulatory Visit (INDEPENDENT_AMBULATORY_CARE_PROVIDER_SITE_OTHER): Payer: BC Managed Care – PPO | Admitting: Adult Health

## 2020-02-17 DIAGNOSIS — F410 Panic disorder [episodic paroxysmal anxiety] without agoraphobia: Secondary | ICD-10-CM | POA: Diagnosis not present

## 2020-02-17 DIAGNOSIS — F319 Bipolar disorder, unspecified: Secondary | ICD-10-CM

## 2020-02-17 DIAGNOSIS — F902 Attention-deficit hyperactivity disorder, combined type: Secondary | ICD-10-CM

## 2020-02-17 DIAGNOSIS — F331 Major depressive disorder, recurrent, moderate: Secondary | ICD-10-CM | POA: Diagnosis not present

## 2020-02-17 DIAGNOSIS — G4700 Insomnia, unspecified: Secondary | ICD-10-CM

## 2020-02-17 DIAGNOSIS — F429 Obsessive-compulsive disorder, unspecified: Secondary | ICD-10-CM

## 2020-02-17 DIAGNOSIS — F411 Generalized anxiety disorder: Secondary | ICD-10-CM

## 2020-02-17 NOTE — Progress Notes (Signed)
[name] [id number] [birth year] 45 y.o.  Subjective:   Patient ID:  [name] is a 45 y.o. (DOB October 28, 1975) female.  Chief Complaint: No chief complaint on file.   HPI [name] presents to the office today for follow-up of ADHD, GAD, MDD, insomnia.   Describes mood today as "ok". Pleasant. Mood symptoms - reports decreased depression, anxiety, and irritability - "a little but no where near where it was last year". Decreased panic attacks - "a couple" - in the evening. Stating "I feel better than I did last month". Mostly staying home. Continues to have issues sleeping - clonazepam not helpful for sleep. Improved interest and motivation. Taking medications as prescribed.  Energy levels decreased. Active, does not have a regular exercise routine.  Enjoys some usual interests and activities. Married. Lives husband of 44 years and 3 sons. Spending time with family. Appetite varies - "binge eating". Weight stable - 304 pounds. Sleeping difficulties. Averages 3 to 5 hours.  Focus and concentration stable. Completing tasks. Managing aspects of household. Work as a Geophysicist/field seismologist.  Denies SI or HI.  Denies AH or VH.   Previous medications: Seroquel-tremors, Wellbutrin   [employer]   Flowsheet Row Office Visit from 07/29/2016 in [facility]  PHQ-2 Total Score 6  PHQ-9 Total Score 21       Review of Systems:  Review of Systems  Musculoskeletal: Negative for gait problem.  Neurological: Negative for tremors.  Psychiatric/Behavioral:       Please refer to HPI    Medications: I have reviewed the patient's current medications.  Current Outpatient Medications  Medication Sig Dispense Refill  . albuterol (VENTOLIN HFA) 108 (90 Base) MCG/ACT inhaler     . amphetamine-dextroamphetamine (ADDERALL) 30 MG tablet Take one tablet in the morning and 1/2 tablet at lunch. 45 tablet 0  . amphetamine-dextroamphetamine (ADDERALL) 30 MG tablet Take one tablet in the morning and 1/2 tablet at  lunch. 45 tablet 0  . clonazePAM (KLONOPIN) 1 MG tablet Take 1 tablet (1 mg total) by mouth at bedtime. 30 tablet 2  . FLUoxetine (PROZAC) 40 MG capsule TAKE 1 CAPSULE BY MOUTH DAILY 30 capsule 2  . lamoTRIgine (LAMICTAL) 150 MG tablet Take one tablet at bedtime. 30 tablet 5  . meloxicam (MOBIC) 15 MG tablet Take 15 mg by mouth daily.    . risperiDONE (RISPERDAL) 1 MG tablet Take 1/2 tablet at bedtime x 7 nights, then take one tablet at bedtime. 30 tablet 2  . silver sulfADIAZINE (SILVADENE) 1 % cream Apply 1 application topically daily. 100 g 0  . zolpidem (AMBIEN) 10 MG tablet TAKE 1 TABLET(10 MG) BY MOUTH AT BEDTIME AS NEEDED FOR SLEEP 30 tablet 2   No current facility-administered medications for this visit.    Medication Side Effects: None  Allergies:  Allergies  Allergen Reactions  . Codeine Nausea And Vomiting    Past Medical History:  Diagnosis Date  . Anxiety   . Asthma   . Depression   . History of shoulder surgery   . Hypothyroidism   . Obesity   . Plantar fasciitis 04/26/2013    Family History  Problem Relation Age of Onset  . Cervical cancer Mother   . Diabetes Father   . Hypertension Father   . Arthritis Maternal Grandmother   . Breast cancer Maternal Grandmother   . Prostate cancer Maternal Grandfather   . Anxiety disorder Son   . Asperger's syndrome Son   . OCD Son   . ADD /  ADHD Son   . Depression Son     Social History   Socioeconomic History  . Marital status: Married    Spouse name: Not on file  . Number of children: Not on file  . Years of education: Not on file  . Highest education level: Not on file  Occupational History  . Not on file  Tobacco Use  . Smoking status: Never Smoker  . Smokeless tobacco: Never Used  Substance and Sexual Activity  . Alcohol use: Yes    Comment: Occasional  . Drug use: No  . Sexual activity: Yes    Birth control/protection: Surgical    Comment: hyst  Other Topics Concern  . Not on file  Social  History Narrative  . Not on file   Social Determinants of Health   Financial Resource Strain: Not on file  Food Insecurity: Not on file  Transportation Needs: Not on file  Physical Activity: Not on file  Stress: Not on file  Social Connections: Not on file  Intimate Partner Violence: Not on file    Past Medical History, Surgical history, Social history, and Family history were reviewed and updated as appropriate.   Please see review of systems for further details on the patient's review from today.   Objective:   Physical Exam:  There were no vitals taken for this visit.  Physical Exam Constitutional:      General: She is not in acute distress. Musculoskeletal:        General: No deformity.  Neurological:     Mental Status: She is alert and oriented to person, place, and time.     Coordination: Coordination normal.  Psychiatric:        Attention and Perception: Attention and perception normal. She does not perceive auditory or visual hallucinations.        Mood and Affect: Mood normal. Mood is not anxious or depressed. Affect is not labile, blunt, angry or inappropriate.        Speech: Speech normal.        Behavior: Behavior normal.        Thought Content: Thought content normal. Thought content is not paranoid or delusional. Thought content does not include homicidal or suicidal ideation. Thought content does not include homicidal or suicidal plan.        Cognition and Memory: Cognition and memory normal.        Judgment: Judgment normal.     Comments: Insight intact     Lab Review:     Component Value Date/Time   NA 142 10/29/2007 2043   K 4.3 10/29/2007 2043   CL 102 10/29/2007 2043   GLUCOSE 97 10/29/2007 2043   BUN 13 10/29/2007 2043   CREATININE 0.9 10/29/2007 2043       Component Value Date/Time   HGB 14.3 10/29/2007 2043   HCT 42.0 10/29/2007 2043    No results found for: POCLITH, LITHIUM   No results found for: PHENYTOIN, PHENOBARB, VALPROATE,  CBMZ   .res Assessment: Plan:    Plan:  PDMP reviewed   1. Prozac 40mg  daily 2. Adderall 30mg  daily - 1 and 1/2 tabs daily 3. Lamictal 150mg  daily  Patient ADHD testing completed in office - [facility]. Meets DSM-5 criteria for Adult ADHD.   RTC 8 weeks  Patient advised to contact office with any questions, adverse effects, or acute worsening in signs and symptoms.  Discussed potential benefits, risks, and side effects of stimulants with patient to include increased heart rate,  palpitations, insomnia, increased anxiety, increased irritability, or decreased appetite.  Instructed patient to contact office if experiencing any significant tolerability issues.  Counseled patient regarding potential benefits, risks, and side effects of Lamictal to include potential risk of Stevens-Johnson syndrome. Advised patient to stop taking Lamictal and contact office immediately if rash develops and to seek urgent medical attention if rash is severe and/or spreading quickly.     Diagnoses and all orders for this visit:  Bipolar I disorder ([place])  Insomnia, unspecified type  Major depressive disorder, recurrent episode, moderate (HCC)  Panic attacks  Generalized anxiety disorder  Obsessive-compulsive disorder, unspecified type  Attention deficit hyperactivity disorder (ADHD), combined type     Please see After Visit Summary for patient specific instructions.  No future appointments.  No orders of the defined types were placed in this encounter.   -------------------------------

## 2020-03-01 ENCOUNTER — Other Ambulatory Visit (HOSPITAL_COMMUNITY): Payer: Self-pay

## 2020-03-08 ENCOUNTER — Other Ambulatory Visit (HOSPITAL_COMMUNITY): Payer: Self-pay

## 2020-03-08 DIAGNOSIS — N6320 Unspecified lump in the left breast, unspecified quadrant: Secondary | ICD-10-CM

## 2020-03-08 DIAGNOSIS — N63 Unspecified lump in unspecified breast: Secondary | ICD-10-CM

## 2020-03-09 ENCOUNTER — Other Ambulatory Visit (HOSPITAL_COMMUNITY): Payer: Self-pay | Admitting: Family Medicine

## 2020-03-09 DIAGNOSIS — N63 Unspecified lump in unspecified breast: Secondary | ICD-10-CM

## 2020-03-10 ENCOUNTER — Ambulatory Visit (HOSPITAL_COMMUNITY)
Admission: RE | Admit: 2020-03-10 | Discharge: 2020-03-10 | Disposition: A | Discharge status: Home / Self Care | Payer: BC Managed Care – PPO | Source: Ambulatory Visit | Attending: Family Medicine | Admitting: Family Medicine

## 2020-03-10 ENCOUNTER — Other Ambulatory Visit (HOSPITAL_COMMUNITY): Payer: Self-pay | Admitting: Family Medicine

## 2020-03-10 ENCOUNTER — Other Ambulatory Visit: Payer: Self-pay

## 2020-03-10 DIAGNOSIS — N63 Unspecified lump in unspecified breast: Secondary | ICD-10-CM | POA: Insufficient documentation

## 2020-03-10 DIAGNOSIS — R928 Other abnormal and inconclusive findings on diagnostic imaging of breast: Secondary | ICD-10-CM

## 2020-03-14 ENCOUNTER — Ambulatory Visit (HOSPITAL_COMMUNITY)
Admission: RE | Admit: 2020-03-14 | Discharge: 2020-03-14 | Disposition: A | Discharge status: Home / Self Care | Payer: BC Managed Care – PPO | Source: Ambulatory Visit | Attending: Family Medicine | Admitting: Family Medicine

## 2020-03-14 ENCOUNTER — Encounter (HOSPITAL_COMMUNITY): Payer: Self-pay

## 2020-03-14 ENCOUNTER — Other Ambulatory Visit: Payer: Self-pay

## 2020-03-14 ENCOUNTER — Other Ambulatory Visit (HOSPITAL_COMMUNITY): Payer: Self-pay | Admitting: Family Medicine

## 2020-03-14 DIAGNOSIS — R928 Other abnormal and inconclusive findings on diagnostic imaging of breast: Secondary | ICD-10-CM

## 2020-03-14 MED ORDER — LIDOCAINE HCL (PF) 2 % IJ SOLN
INTRAMUSCULAR | Status: AC
  Administered 2020-03-14: 10 mL
  Filled 2020-03-14: qty 10

## 2020-03-14 MED ORDER — LIDOCAINE-EPINEPHRINE (PF) 1 %-1:200000 IJ SOLN
10.0000 mL | Freq: Once | INTRAMUSCULAR | Status: AC
  Administered 2020-03-14: 10 mL via INTRADERMAL

## 2020-03-14 NOTE — Discharge Instructions (Signed)
Breast Biopsy, Care After °These instructions give you information about caring for yourself after your procedure. Your doctor may also give you more specific instructions. Call your doctor if you have any problems or questions after your procedure. °What can I expect after the procedure? °After your procedure, it is common to have: °· Bruising on your breast. °· Numbness, tingling, or pain near your biopsy site. °Follow these instructions at home: °Medicines °· Take over-the-counter and prescription medicines only as told by your doctor. °· Do not drive for 24 hours if you were given a medicine to help you relax (sedative) during your procedure. °· Do not drink alcohol while taking pain medicine. °· Do not drive or use heavy machinery while taking prescription pain medicine. °Biopsy site care °· Follow instructions from your doctor about how to take care of your cut from surgery (incision) or your puncture area. Make sure you: °? Wash your hands with soap and water before you change your bandage (dressing). If you cannot use soap and water, use hand sanitizer. °? Change your bandage as told by your doctor. °? Leave stitches (sutures), skin glue, or skin tape (adhesive strips) in place. They may need to stay in place for 2 weeks or longer. If tape strips get loose and curl up, you may trim the loose edges. Do not remove tape strips completely unless your doctor says it is okay. °· If you have stitches, keep them dry when you take a bath or a shower. °· Check your cut or puncture area every day for signs of infection. Check for: °? Redness, swelling, or pain. °? Fluid or blood. °? Warmth. °? Pus or a bad smell. °· Protect the biopsy area. Do not let the area get bumped.  °  °  °Activity °· If you had a cut during your procedure, avoid activities that could pull your cut open. These include: °? Stretching. °? Reaching over your head. °? Exercise. °? Sports. °? Lifting anything that weighs more than 3 lb (1.4  kg). °· Return to your normal activities as told by your doctor. Ask your doctor what activities are safe for you. °Managing pain, stiffness, and swelling °If told, put ice on the biopsy site to relieve swelling: °· Put ice in a plastic bag. °· Place a towel between your skin and the bag. °· Leave the ice on for 20 minutes, 2-3 times a day. °General instructions °· Continue your normal diet. °· Wear a good support bra for as long as told by your doctor. °· Get checked for extra fluid around your lymph nodes (lymphedema) as often as told by your doctor. °· Keep all follow-up visits as told by your doctor. This is important. °Contact a doctor if: °· You notice any of the following at the biopsy site: °? More redness, swelling, or pain. °? More fluid or blood coming from the site. °? The site feels warm to the touch. °? Pus or a bad smell coming from the site. °? The site breaks open after the stitches or skin tape strips have been removed. °· You have a rash. °· You have a fever. °Get help right away if: °· You have more bleeding from the biopsy site. Get help right away if bleeding is more than a small spot. °· You have trouble breathing. °· You have red streaks around the biopsy site. °Summary °· After your procedure, it is common to have bruising, numbness, tingling, or pain near the biopsy site. °· Do not   drive or use heavy machinery while taking prescription pain medicine. °· Wear a good support bra for as long as told by your doctor. °· If you had a cut during your procedure, avoid activities that may pull the cut open. Ask your doctor what activities are safe for you. °This information is not intended to replace advice given to you by your health care provider. Make sure you discuss any questions you have with your health care provider. °Document Revised: 09/13/2019 Document Reviewed: 06/19/2017 °Elsevier Patient Education © 2021 [employer] ° °

## 2020-03-14 NOTE — Sedation Documentation (Signed)
PT tolerated left breast biopsy well today with NAD noted. PT verbalized understanding of discharge instructions. PT ambulated back to the mammogram area this time.  

## 2020-03-15 LAB — SURGICAL PATHOLOGY

## 2020-03-29 ENCOUNTER — Other Ambulatory Visit: Payer: Self-pay | Admitting: Adult Health

## 2020-03-29 DIAGNOSIS — F902 Attention-deficit hyperactivity disorder, combined type: Secondary | ICD-10-CM

## 2020-03-29 DIAGNOSIS — F411 Generalized anxiety disorder: Secondary | ICD-10-CM

## 2020-03-29 MED ORDER — AMPHETAMINE-DEXTROAMPHETAMINE 30 MG PO TABS: ORAL_TABLET | ORAL | 0 refills | Status: DC

## 2020-04-11 ENCOUNTER — Other Ambulatory Visit: Payer: Self-pay | Admitting: Surgery

## 2020-04-11 ENCOUNTER — Other Ambulatory Visit (HOSPITAL_COMMUNITY): Payer: Self-pay | Admitting: Surgery

## 2020-04-17 ENCOUNTER — Other Ambulatory Visit: Payer: Self-pay | Admitting: General Surgery

## 2020-04-17 DIAGNOSIS — D242 Benign neoplasm of left breast: Secondary | ICD-10-CM

## 2020-04-18 ENCOUNTER — Other Ambulatory Visit: Payer: Self-pay | Admitting: General Surgery

## 2020-04-18 ENCOUNTER — Encounter: Payer: Self-pay | Admitting: Skilled Nursing Facility1

## 2020-04-18 ENCOUNTER — Encounter: Payer: BC Managed Care – PPO | Attending: Surgery | Admitting: Skilled Nursing Facility1

## 2020-04-18 ENCOUNTER — Other Ambulatory Visit: Payer: Self-pay

## 2020-04-18 DIAGNOSIS — D242 Benign neoplasm of left breast: Secondary | ICD-10-CM

## 2020-04-18 DIAGNOSIS — E669 Obesity, unspecified: Secondary | ICD-10-CM | POA: Diagnosis present

## 2020-04-18 NOTE — Progress Notes (Signed)
Nutrition Assessment for Bariatric Surgery Medical Nutrition Therapy Appt Start Time: 10:00 End Time:11:03  Patient was seen on 04/18/2020 for Pre-Operative Nutrition Assessment. Letter of approval faxed to [facility] bariatric surgery program coordinator on 04/18/2020  Referral stated Supervised Weight Loss (SWL) visits needed: 6  Planned surgery: sleeve gastrectomy  Pt expectation of surgery: to lose weight Pt expectation of dietitian: to teach me    NUTRITION ASSESSMENT   Anthropometrics  Start weight at [facility]: 307 lbs (date: 04/18/2020)  Height: 63 in BMI: 54.38 kg/m2     Clinical  Medical hx: depression, thyroid disease, anxiety, asthma Medications: see list Labs:  Notable signs/symptoms: a lot of pressure and pain in her thighs and behind her knees Any previous deficiencies? No  Micronutrient Nutrition Focused Physical Exam: Hair: No issues observed Eyes: No issues observed Mouth: No issues observed Neck: No issues observed Nails: No issues observed Skin: No issues observed  Lifestyle & Dietary Hx  Pt states she is having a mass removed from her breast which has been biopsied but still need to biopsy the tissue and ducts around it. Pt states it is a bit stressful. Pt states she feels pain in her thighs and behind her knees: Dietitian advised pt to speak with her physician about those symptoms.  Pt states she used to drink a pot of coffee but now down to 1 cup (not 8 oz). Pt states alternate sugars have a weird after taste.   24-Hr Dietary Recall First Meal: peanut butter bread or pancakes Snack:  Second Meal: skipped  Snack: pretzels Third Meal: spaghetti Snack: pretzels + cereal + doughnuts or ice cream Beverages: water, sweet tea, soda, juice, coffee + flavored creamer + sugar   Estimated Energy Needs Calories: 1500   NUTRITION DIAGNOSIS  Overweight/obesity ([place]-3.3) related to past poor dietary habits and physical inactivity as evidenced by  patient w/ planned sleeve surgery following dietary guidelines for continued weight loss.    NUTRITION INTERVENTION  Nutrition counseling (C-1) and education (E-2) to facilitate bariatric surgery goals.   Pre-Op Goals Reviewed with the Patient . Track food and beverage intake (pen and paper, MyFitness Pal, Barit[employer] app, etc.): 4 days a week inclusive of 1 weekend day . Make healthy food choices while monitoring portion sizes . Consume 3 meals per day or try to eat every 3-5 hours . Avoid concentrated sugars and fried foods . Keep sugar & fat in the single digits per serving on food labels . Practice CHEWING your food (aim for applesauce consistency) . Practice not drinking 15 minutes before, during, and 30 minutes after each meal and snack . Avoid all carbonated beverages (ex: soda, sparkling beverages)  . Limit caffeinated beverages (ex: coffee, tea, energy drinks) . Avoid all sugar-sweetened beverages (ex: regular soda, sports drinks): avoid sweet tea: increase water with flavorings, have conversation with family explaining no more sweet tea is to be made and they can add sugar to their cup . Avoid alcohol  . Aim for 64-100 ounces of FLUID daily (with at least half of fluid intake being plain water)  . Aim for at least 60-80 grams of PROTEIN daily . Look for a liquid protein source that contains ?15 g protein and ?5 g carbohydrate (ex: shakes, drinks, shots) . Make a list of non-food related activities . Physical activity is an important part of a healthy lifestyle so keep it moving! The goal is to reach 150 minutes of exercise per week, including cardiovascular and weight baring activity. Marland Kitchen  Ask your doctor for a referral for pain in legs  *Goals that are bolded indicate the pt would like to start working towards these  Handouts Provided Include  . Bariatric Surgery handouts (Nutrition Visits, Pre-Op Goals, Protein Shakes, Vitamins & Minerals)  Learning Style & Readiness for  Change Teaching method utilized: Visual & Auditory  Demonstrated degree of understanding via: Teach Back  Readiness Level: contemplative  Barriers to learning/adherence to lifestyle change: previous dieting behaviors   RD's Notes for Next Visit . Assess pts adherence to chosen goals    MONITORING & EVALUATION Dietary intake, weekly physical activity, body weight, and pre-op goals reached at next nutrition visit.    Next Steps  Patient is to follow up at [facility] for Pre-Op Class >2 weeks before surgery for further nutrition education.  Return for next SWL visit

## 2020-04-24 ENCOUNTER — Telehealth: Payer: Self-pay | Admitting: Genetic Counselor

## 2020-04-24 NOTE — Telephone Encounter (Signed)
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 Ms. [name] returned my call and has been scheduled to see karen on 4/20 at 10am. Pt aware to arrive 15 minutes early.

## 2020-04-27 ENCOUNTER — Ambulatory Visit (HOSPITAL_COMMUNITY)
Admission: RE | Admit: 2020-04-27 | Discharge: 2020-04-27 | Disposition: A | Discharge status: Home / Self Care | Payer: BC Managed Care – PPO | Source: Ambulatory Visit | Attending: Surgery | Admitting: Surgery

## 2020-04-27 ENCOUNTER — Other Ambulatory Visit: Payer: Self-pay

## 2020-05-03 ENCOUNTER — Encounter: Payer: Self-pay | Admitting: Genetic Counselor

## 2020-05-03 ENCOUNTER — Inpatient Hospital Stay: Payer: BC Managed Care – PPO | Attending: Genetic Counselor | Admitting: Genetic Counselor

## 2020-05-03 ENCOUNTER — Other Ambulatory Visit: Payer: Self-pay

## 2020-05-03 ENCOUNTER — Inpatient Hospital Stay: Payer: BC Managed Care – PPO

## 2020-05-03 DIAGNOSIS — N6321 Unspecified lump in the left breast, upper outer quadrant: Secondary | ICD-10-CM

## 2020-05-03 DIAGNOSIS — Z8042 Family history of malignant neoplasm of prostate: Secondary | ICD-10-CM | POA: Diagnosis not present

## 2020-05-03 DIAGNOSIS — Z803 Family history of malignant neoplasm of breast: Secondary | ICD-10-CM | POA: Diagnosis not present

## 2020-05-03 DIAGNOSIS — N6320 Unspecified lump in the left breast, unspecified quadrant: Secondary | ICD-10-CM

## 2020-05-03 DIAGNOSIS — Z8051 Family history of malignant neoplasm of kidney: Secondary | ICD-10-CM | POA: Diagnosis not present

## 2020-05-03 NOTE — Progress Notes (Signed)
REFERRING PROVIDER: Rolm Bookbinder, MD [address] [place],  [place] [address]  PRIMARY PROVIDER:  Celene Squibb, MD  PRIMARY REASON FOR VISIT:  1. Left breast mass   2. Family history of prostate cancer   3. Family history of kidney cancer   4. Family history of breast cancer      HISTORY OF PRESENT ILLNESS:   Ms. Taff, a 45 y.o. female, was seen for a [facility] cancer genetics consultation at the request of Dr. Donne Hazel due to a family history of cancer.  Ms. Tait presents to clinic today to discuss the possibility of a hereditary predisposition to cancer, genetic testing, and to further clarify her future cancer risks, as well as potential cancer risks for family members.   Ms. Devine is a 45 y.o. female with no personal history of cancer.  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  CANCER HISTORY:  Oncology History   No history exists.     RISK FACTORS:  Menarche was at age 89.  First live birth at age 10.  OCP use for approximately <5 years.  Ovaries intact: yes.  Hysterectomy: yes.  Menopausal status: premenopausal.  HRT use: 0 years. Colonoscopy: no; not examined. Mammogram within the last year: yes. Number of breast biopsies: 1. Up to date with pelvic exams: n/a. Any excessive radiation exposure in the past: no  Past Medical History:  Diagnosis Date  . Anxiety   . Asthma   . Depression   . Family history of breast cancer   . Family history of kidney cancer   . Family history of prostate cancer   . History of shoulder surgery   . Hypothyroidism   . Obesity   . Plantar fasciitis 04/26/2013    Past Surgical History:  Procedure Laterality Date  . ABDOMINAL HYSTERECTOMY    . APPENDECTOMY    . CHOLECYSTECTOMY    . DILATION AND CURETTAGE OF UTERUS    . FRACTURE SURGERY    . LEG SURGERY Right   . SHOULDER SURGERY Right     Social History   Socioeconomic History  . Marital status: Married     Spouse name: Not on file  . Number of children: Not on file  . Years of education: Not on file  . Highest education level: Not on file  Occupational History  . Not on file  Tobacco Use  . Smoking status: Never Smoker  . Smokeless tobacco: Never Used  Vaping Use  . Vaping Use: Never used  Substance and Sexual Activity  . Alcohol use: Yes    Comment: Occasional  . Drug use: No  . Sexual activity: Yes    Birth control/protection: Surgical    Comment: hyst  Other Topics Concern  . Not on file  Social History Narrative  . Not on file   Social Determinants of Health   Financial Resource Strain: Not on file  Food Insecurity: Not on file  Transportation Needs: Not on file  Physical Activity: Not on file  Stress: Not on file  Social Connections: Not on file     FAMILY HISTORY:  We obtained a detailed, 4-generation family history.  Significant diagnoses are listed below: Family History  Problem Relation Age of Onset  . Cervical cancer Mother   . Diabetes Father   . Hypertension Father   . Kidney cancer Maternal Aunt        dx in her  61s  . Cervical cancer Maternal Aunt   . Arthritis Maternal Grandmother   . Breast cancer Maternal Grandmother        dx in her 25s  . Prostate cancer Maternal Grandfather   . Anxiety disorder Son   . Asperger's syndrome Son   . OCD Son   . ADD / ADHD Son   . Depression Son   . Breast cancer Paternal Aunt        dx > 50  . Lung cancer Paternal Grandmother   . Lung cancer Paternal Grandfather   . Breast cancer Other        PGMs sister d. 59  . Breast cancer Other        PGMs sister d. 57    The patient has three sons who are cancer free. She has a full brother and maternal half sister who are cancer free, however her sister had a benign mass removed from her breast at age 19.  Both parents are living.  The patients mother had cervical cancer in her 60's.  She has one sister who had cervical cancer in her 82's and kidney cancer in  her 57's.  The maternal grandmother is living and had breast cancer in her 46's.  She has a brother who died of stomach cancer, a sister with skin cancer and another sister who died of breast cancer.  The MGM's mother had stomach cancer.  The maternal grandfather died of prostate cancer.  His mother had breast cancer.  The patient's father has parathyroid disease.  He was also adopted.  He had one full sister who is cancer free and a maternal half sister who died of heart disease and one who had breast cancer over age 80.  The paternal grandparents are deceased, both from lung cancer.  The paternal grandmother had two sisters with breast cancer who died at 84 and 39.  One had a daughter with breast cancer.  The paternal grandfather had both of his parents die of cancer.  Ms. England is unaware of previous family history of genetic testing for hereditary cancer risks. Patient's maternal ancestors are of [place], [place] and [place] descent, and paternal ancestors are of [place], [place] and [place] descent. There is no reported Ashkenazi Jewish ancestry. There is no known consanguinity.  GENETIC COUNSELING ASSESSMENT: Ms. Bless is a 45 y.o. female with a family history of cancer which is somewhat suggestive of a hereditary cancer syndrome and predisposition to cancer given the number of women in the family with breast cancer and the age of onset. We, therefore, discussed and recommended the following at today's visit.   DISCUSSION: We discussed that 5 - 10% of breast cancer is hereditary, with most cases associated with BRCA mutations.  There are other genes that can be associated with hereditary breast cancer syndromes.  These include ATM, CHEK2 and PALB2.  We discussed that testing is beneficial for several reasons including knowing how to follow individuals after completing their treatment, and understand if other family members could be at risk for cancer and allow them to undergo genetic testing.   We  reviewed the characteristics, features and inheritance patterns of hereditary cancer syndromes. We also discussed genetic testing, including the appropriate family members to test, the process of testing, insurance coverage and turn-around-time for results. We discussed the implications of a negative, positive, carrier and/or variant of uncertain significant result. We recommended Ms. Lenz pursue genetic testing for the CancerNext-Expanded+RNAinsight gene panel. The CancerNext-Expanded gene panel offered  by [facility] and includes sequencing and rearrangement analysis for the following 77 genes: AIP, ALK, APC*, ATM*, AXIN2, BAP1, BARD1, BLM, BMPR1A, BRCA1*, BRCA2*, BRIP1*, CDC73, CDH1*, CDK4, CDKN1B, CDKN2A, CHEK2*, CTNNA1, DICER1, FANCC, FH, FLCN, GALNT12, KIF1B, LZTR1, MAX, MEN1, MET, MLH1*, MSH2*, MSH3, MSH6*, MUTYH*, NBN, NF1*, NF2, NTHL1, PALB2*, PHOX2B, PMS2*, POT1, PRKAR1A, PTCH1, PTEN*, RAD51C*, RAD51D*, RB1, RECQL, RET, SDHA, SDHAF2, SDHB, SDHC, SDHD, SMAD4, SMARCA4, SMARCB1, SMARCE1, STK11, SUFU, TMEM127, TP53*, TSC1, TSC2, VHL and XRCC2 (sequencing and deletion/duplication); EGFR, EGLN1, HOXB13, KIT, MITF, PDGFRA, POLD1, and POLE (sequencing only); EPCAM and GREM1 (deletion/duplication only). DNA and RNA analyses performed for * genes.   Based on Ms. Perkovich's family history of cancer, she meets medical criteria for genetic testing. Despite that she meets criteria, she may still have an out of pocket cost. We discussed that if her out of pocket cost for testing is over $100, the laboratory will call and confirm whether she wants to proceed with testing.  If the out of pocket cost of testing is less than $100 she will be billed by the genetic testing laboratory.   PLAN: After considering the risks, benefits, and limitations, Ms. Tellez provided informed consent to pursue genetic testing and the blood sample was sent to [facility] for analysis of the CancerNext-Expanded+RNAinsight. Results  should be available within approximately 2-3 weeks' time, at which point they will be disclosed by telephone to Ms. Burdo, as will any additional recommendations warranted by these results. Ms. Custer will receive a summary of her genetic counseling visit and a copy of her results once available. This information will also be available in Epic.   Lastly, we encouraged Ms. Paprocki to remain in contact with cancer genetics annually so that we can continuously update the family history and inform her of any changes in cancer genetics and testing that may be of benefit for this family.   Ms. Sgroi's questions were answered to her satisfaction today. Our contact information was provided should additional questions or concerns arise. Thank you for the referral and allowing us to share in the care of your patient.    P. , MS, LCGC Licensed, Certified Genetic Counselor [email or web address] phone: [phone]  The patient was seen for a total of 45 minutes in face-to-face genetic counseling.  This patient was discussed with Drs. Magrinat, Gudena and/or Feng who agrees with the above.    _______________________________________________________________________ For Office Staff:  Number of people involved in session: 1 Was an Intern/ student involved with case: no   

## 2020-05-08 ENCOUNTER — Encounter: Payer: Self-pay | Admitting: Adult Health

## 2020-05-08 ENCOUNTER — Other Ambulatory Visit: Payer: Self-pay

## 2020-05-08 ENCOUNTER — Ambulatory Visit (INDEPENDENT_AMBULATORY_CARE_PROVIDER_SITE_OTHER): Payer: BC Managed Care – PPO | Admitting: Adult Health

## 2020-05-08 DIAGNOSIS — F411 Generalized anxiety disorder: Secondary | ICD-10-CM

## 2020-05-08 DIAGNOSIS — F410 Panic disorder [episodic paroxysmal anxiety] without agoraphobia: Secondary | ICD-10-CM

## 2020-05-08 DIAGNOSIS — F422 Mixed obsessional thoughts and acts: Secondary | ICD-10-CM

## 2020-05-08 DIAGNOSIS — F902 Attention-deficit hyperactivity disorder, combined type: Secondary | ICD-10-CM | POA: Diagnosis not present

## 2020-05-08 DIAGNOSIS — F331 Major depressive disorder, recurrent, moderate: Secondary | ICD-10-CM | POA: Diagnosis not present

## 2020-05-08 MED ORDER — AMPHETAMINE-DEXTROAMPHETAMINE 30 MG PO TABS: ORAL_TABLET | ORAL | 0 refills | Status: DC

## 2020-05-08 MED ORDER — LAMOTRIGINE 150 MG PO TABS: ORAL_TABLET | ORAL | 5 refills | Status: DC

## 2020-05-08 MED ORDER — FLUOXETINE HCL 40 MG PO CAPS: ORAL_CAPSULE | ORAL | 2 refills | Status: DC

## 2020-05-08 NOTE — Progress Notes (Signed)
[name] [id number] 06/22/1975 45 y.o.  Subjective:   Patient ID:  [name] is a 45 y.o. (DOB [birth year]) female.  Chief Complaint: No chief complaint on file.   HPI [name] presents to the office today for follow-up of ADHD, GAD, MDD, insomnia.   Describes mood today as "ok". Pleasant. Mood symptoms - reports decreased depression, anxiety, and irritability.  Stating "I'm feeling stressed". Recent biopsy. Upcoming partial mastectomy - working with [facility].  Decreased panic attacks - "none full on". Continues to have sleep issues. Improved interest and motivation. Taking medications as prescribed.  Energy levels decreased. Active, does not have a regular exercise routine.  Enjoys some usual interests and activities. Married. Lives husband of 23 years and 3 sons. Spending time with family. Appetite varies. Denies recent binge eating. Weight stable - 304 pounds. Has started the process of bariatric assistance. Sleeping difficulties. Averages 3 to 5 hours.  Focus and concentration stable with Adderall. Completing tasks. Managing aspects of household. Work as a Geophysicist/field seismologist.  Denies SI or HI.   Previous medications: Seroquel - tremors, Wellbutrin     PHQ2-9   Flowsheet Row Nutrition from 04/18/2020 in [facility] Visit from 07/29/2016 in [facility]  PHQ-2 Total Score 0 6  PHQ-9 Total Score -- 21       Review of Systems:  Review of Systems  Musculoskeletal: Negative for gait problem.  Neurological: Negative for tremors.  Psychiatric/Behavioral:       Please refer to HPI    Medications: I have reviewed the patient's current medications.  Current Outpatient Medications  Medication Sig Dispense Refill  . acetaminophen (TYLENOL) 500 MG tablet Take 1,000 mg by mouth every 6 (six) hours as needed for moderate pain.    Marland Kitchen albuterol (VENTOLIN HFA) 108 (90 Base) MCG/ACT inhaler Inhale 1-2 puffs into the lungs every 6  (six) hours as needed for wheezing or shortness of breath.    . amphetamine-dextroamphetamine (ADDERALL) 30 MG tablet Take one tablet in the morning and 1/2 tablet at lunch. 45 tablet 0  . [START ON 06/05/2020] amphetamine-dextroamphetamine (ADDERALL) 30 MG tablet Take one tablet in the morning and 1/2 tablet at lunch. 45 tablet 0  . calcium carbonate (TUMS - DOSED IN MG ELEMENTAL CALCIUM) 500 MG chewable tablet Chew 1 tablet by mouth daily as needed for indigestion or heartburn.    . clonazePAM (KLONOPIN) 1 MG tablet Take 1 tablet (1 mg total) by mouth at bedtime. (Patient not taking: Reported on 05/05/2020) 30 tablet 2  . FLUoxetine (PROZAC) 40 MG capsule TAKE 1 CAPSULE BY MOUTH DAILY 30 capsule 2  . fluticasone (FLONASE) 50 MCG/ACT nasal spray Place 2 sprays into both nostrils daily as needed for allergies or rhinitis.    Marland Kitchen lamoTRIgine (LAMICTAL) 150 MG tablet Take one tablet at bedtime. 30 tablet 5  . silver sulfADIAZINE (SILVADENE) 1 % cream Apply 1 application topically daily. (Patient not taking: Reported on 05/05/2020) 100 g 0   No current facility-administered medications for this visit.    Medication Side Effects: None  Allergies:  Allergies  Allergen Reactions  . Codeine Nausea And Vomiting  . Other     Pt has severe nausea with anesthesia  . Wound Dressing Adhesive Rash    Past Medical History:  Diagnosis Date  . Anxiety   . Asthma   . Depression   . Family history of breast cancer   . Family history of kidney cancer   . Family  history of prostate cancer   . History of shoulder surgery   . Hypothyroidism   . Obesity   . Plantar fasciitis 04/26/2013    Past Medical History, Surgical history, Social history, and Family history were reviewed and updated as appropriate.   Please see review of systems for further details on the patient's review from today.   Objective:   Physical Exam:  There were no vitals taken for this visit.  Physical Exam Constitutional:       General: She is not in acute distress. Musculoskeletal:        General: No deformity.  Neurological:     Mental Status: She is alert and oriented to person, place, and time.     Coordination: Coordination normal.  Psychiatric:        Attention and Perception: Attention and perception normal. She does not perceive auditory or visual hallucinations.        Mood and Affect: Mood normal. Mood is not anxious or depressed. Affect is not labile, blunt, angry or inappropriate.        Speech: Speech normal.        Behavior: Behavior normal.        Thought Content: Thought content normal. Thought content is not paranoid or delusional. Thought content does not include homicidal or suicidal ideation. Thought content does not include homicidal or suicidal plan.        Cognition and Memory: Cognition and memory normal.        Judgment: Judgment normal.     Comments: Insight intact     Lab Review:     Component Value Date/Time   NA 142 10/29/2007 2043   K 4.3 10/29/2007 2043   CL 102 10/29/2007 2043   GLUCOSE 97 10/29/2007 2043   BUN 13 10/29/2007 2043   CREATININE 0.9 10/29/2007 2043       Component Value Date/Time   HGB 14.3 10/29/2007 2043   HCT 42.0 10/29/2007 2043    No results found for: POCLITH, LITHIUM   No results found for: PHENYTOIN, PHENOBARB, VALPROATE, CBMZ   .res Assessment: Plan:     Plan:  PDMP reviewed   1. Prozac 40mg  daily 2. Adderall 30mg  daily - 1 and 1/2 tabs daily 3. Lamictal 150mg  daily  Patient ADHD testing completed in office - Conner's. Meets DSM-5 criteria for Adult ADHD.   RTC 8 weeks  Patient advised to contact office with any questions, adverse effects, or acute worsening in signs and symptoms.  Discussed potential benefits, risks, and side effects of stimulants with patient to include increased heart rate, palpitations, insomnia, increased anxiety, increased irritability, or decreased appetite.  Instructed patient to contact office if  experiencing any significant tolerability issues.  Counseled patient regarding potential benefits, risks, and side effects of Lamictal to include potential risk of Stevens-Johnson syndrome. Advised patient to stop taking Lamictal and contact office immediately if rash develops and to seek urgent medical attention if rash is severe and/or spreading quickly.    Diagnoses and all orders for this visit:  Panic attacks  Major depressive disorder, recurrent episode, moderate (HCC) -     lamoTRIgine (LAMICTAL) 150 MG tablet; Take one tablet at bedtime.  Mixed obsessional thoughts and acts -     FLUoxetine (PROZAC) 40 MG capsule; TAKE 1 CAPSULE BY MOUTH DAILY  Attention deficit hyperactivity disorder (ADHD), combined type -     amphetamine-dextroamphetamine (ADDERALL) 30 MG tablet; Take one tablet in the morning and 1/2 tablet at lunch. -  amphetamine-dextroamphetamine (ADDERALL) 30 MG tablet; Take one tablet in the morning and 1/2 tablet at lunch.  Generalized anxiety disorder -     amphetamine-dextroamphetamine (ADDERALL) 30 MG tablet; Take one tablet in the morning and 1/2 tablet at lunch.     Please see After Visit Summary for patient specific instructions.  Future Appointments  Date Time Provider [place]  05/10/2020  1:00 PM [facility] PAT 1 [facility] None  05/15/2020 11:00 AM [facility] None  05/16/2020  2:00 PM Scotece, Alexis B, RD [facility] NDM  05/17/2020  1:00 PM [facility] MM IR 1 [facility]  05/18/2020  8:00 AM [facility] BREAST SPECIMEN [facility]    No orders of the defined types were placed in this encounter.   -------------------------------

## 2020-05-10 ENCOUNTER — Encounter (HOSPITAL_COMMUNITY)
Admission: RE | Admit: 2020-05-10 | Discharge: 2020-05-10 | Disposition: A | Discharge status: Home / Self Care | Payer: BC Managed Care – PPO | Source: Ambulatory Visit | Attending: General Surgery | Admitting: General Surgery

## 2020-05-10 ENCOUNTER — Encounter (HOSPITAL_COMMUNITY): Payer: Self-pay

## 2020-05-10 ENCOUNTER — Other Ambulatory Visit: Payer: Self-pay

## 2020-05-10 DIAGNOSIS — Z01812 Encounter for preprocedural laboratory examination: Secondary | ICD-10-CM | POA: Diagnosis present

## 2020-05-10 HISTORY — DX: Unspecified osteoarthritis, unspecified site: M19.90

## 2020-05-10 HISTORY — DX: Nausea with vomiting, unspecified: R11.2

## 2020-05-10 HISTORY — DX: Other specified postprocedural states: Z98.890

## 2020-05-10 HISTORY — DX: Personal history of other diseases of the digestive system: Z87.19

## 2020-05-10 HISTORY — DX: Pneumonia, unspecified organism: J18.9

## 2020-05-10 HISTORY — DX: Family history of other specified conditions: Z84.89

## 2020-05-10 HISTORY — DX: Bipolar disorder, unspecified: F31.9

## 2020-05-10 HISTORY — DX: Headache, unspecified: R51.9

## 2020-05-10 HISTORY — DX: Gastro-esophageal reflux disease without esophagitis: K21.9

## 2020-05-10 LAB — CBC
HCT: 43.2 % (ref 36.0–46.0)
Hemoglobin: 13.6 g/dL (ref 12.0–15.0)
MCH: 29.2 pg (ref 26.0–34.0)
MCHC: 31.5 g/dL (ref 30.0–36.0)
MCV: 92.7 fL (ref 80.0–100.0)
Platelets: 338 10*3/uL (ref 150–400)
RBC: 4.66 MIL/uL (ref 3.87–5.11)
RDW: 12.6 % (ref 11.5–15.5)
WBC: 9.6 10*3/uL (ref 4.0–10.5)
nRBC: 0 % (ref 0.0–0.2)

## 2020-05-10 NOTE — Progress Notes (Signed)
Surgical Instructions    Your procedure is scheduled on 05/18/2020.  Report to [facility] Main Entrance "A" at 05:30 A.M., then check in with the Admitting office.  Call this number if you have problems the morning of surgery:  [phone]   If you have any questions prior to your surgery date call [phone]: Open Monday-Friday 8am-4pm    Remember:  Do not eat after midnight the night before your surgery  You may drink clear liquids until 04:30am  the morning of your surgery.   Clear liquids allowed are: Water, Non-Citrus Juices (without pulp), Carbonated Beverages, Clear Tea, Black Coffee Only, and Gatorade  Patient Instructions  . The night before surgery:  o No food after midnight. ONLY clear liquids after midnight  . The day of surgery (if you do NOT have diabetes):  o Drink ONE (1) Pre-Surgery Clear Ensure by 4:30am the morning of surgery. Drink in one sitting. Do not sip.  o This drink was given to you during your hospital  pre-op appointment visit. o Nothing else to drink after completing the  Pre-Surgery Clear Ensure.          If you have questions, please contact your surgeon's office.     Take these medicines the morning of surgery with A SIP OF WATER  acetaminophen (TYLENOL) if needed albuterol (VENTOLIN HFA) if needed calcium carbonate (TUMS - DOSED IN MG ELEMENTAL CALCIUM) if needed FLUoxetine (PROZAC)  fluticasone (FLONASE) if needed   As of today, STOP taking any Aspirin (unless otherwise instructed by your surgeon) Aleve, Naproxen, Ibuprofen, Motrin, Advil, Goody's, BC's, all herbal medications, fish oil, and all vitamins.                     Do not wear jewelry, make up, or nail polish            Do not wear lotions, powders, perfumes/colognes, or deodorant.            Do not shave 48 hours prior to surgery.              Do not bring valuables to the hospital.            [facility] is not responsible for any belongings or valuables.  Do NOT Smoke  (Tobacco/Vaping) or drink Alcohol 24 hours prior to your procedure If you use a CPAP at night, you may bring all equipment for your overnight stay.   Contacts, glasses, dentures or bridgework may not be worn into surgery, please bring cases for these belongings   For patients admitted to the hospital, discharge time will be determined by your treatment team.   Patients discharged the day of surgery will not be allowed to drive home, and someone needs to stay with them for 24 hours.    Special instructions:   [facility]- Preparing For Surgery  Before surgery, you can play an important role. Because skin is not sterile, your skin needs to be as free of germs as possible. You can reduce the number of germs on your skin by washing with CHG (chlorahexidine gluconate) Soap before surgery.  CHG is an antiseptic cleaner which kills germs and bonds with the skin to continue killing germs even after washing.    Oral Hygiene is also important to reduce your risk of infection.  Remember - BRUSH YOUR TEETH THE MORNING OF SURGERY WITH YOUR REGULAR TOOTHPASTE  Please do not use if you have an allergy to CHG or antibacterial  soaps. If your skin becomes reddened/irritated stop using the CHG.  Do not shave (including legs and underarms) for at least 48 hours prior to first CHG shower. It is OK to shave your face.  Please follow these instructions carefully.   1. Shower the NIGHT BEFORE SURGERY and the MORNING OF SURGERY  2. If you chose to wash your hair, wash your hair first as usual with your normal shampoo.  3. After you shampoo, rinse your hair and body thoroughly to remove the shampoo.  4. Wash Face and genitals (private parts) with your normal soap.   5.  Shower the NIGHT BEFORE SURGERY and the MORNING OF SURGERY with CHG Soap.   6. Use CHG Soap as you would any other liquid soap. You can apply CHG directly to the skin and wash gently with a scrungie or a clean washcloth.   7. Apply the CHG  Soap to your body ONLY FROM THE NECK DOWN.  Do not use on open wounds or open sores. Avoid contact with your eyes, ears, mouth and genitals (private parts). Wash Face and genitals (private parts)  with your normal soap.   8. Wash thoroughly, paying special attention to the area where your surgery will be performed.  9. Thoroughly rinse your body with warm water from the neck down.  10. DO NOT shower/wash with your normal soap after using and rinsing off the CHG Soap.  11. Pat yourself dry with a CLEAN TOWEL.  12. Wear CLEAN PAJAMAS to bed the night before surgery  13. Place CLEAN SHEETS on your bed the night before your surgery  14. DO NOT SLEEP WITH PETS.   Day of Surgery: Take a shower with CHG soap. Wear Clean/Comfortable clothing the morning of surgery Do not apply any deodorants/lotions.   Remember to brush your teeth WITH YOUR REGULAR TOOTHPASTE.   Please read over the following fact sheets that you were given.

## 2020-05-10 NOTE — Progress Notes (Signed)
PCP - Dr. Wende Neighbors Cardiologist - Dr. Domenic Polite  Chest x-ray - 04/27/20 EKG - 04/27/20 Stress Test - denies ECHO - 07/01/18 Cardiac Cath - denies  Sleep Study - denies CPAP - n/a  ERAS Protcol - Yes PRE-SURGERY Ensure or G2- Ensure  COVID TEST- Scheduled for 05/15/20   Anesthesia review: No  Patient denies shortness of breath, fever, cough and chest pain at PAT appointment   All instructions explained to the patient, with a verbal understanding of the material. Patient agrees to go over the instructions while at home for a better understanding. Patient also instructed to self quarantine after being tested for COVID-19. The opportunity to ask questions was provided.

## 2020-05-11 ENCOUNTER — Telehealth: Payer: Self-pay | Admitting: Genetic Counselor

## 2020-05-11 NOTE — Telephone Encounter (Signed)
Revealed that the BRCAPlus testing was negative.  This test contains the high risk genes that would play a role in surgical decisions.  The remainder of testing is pending.  We will call as soon as that is completed.

## 2020-05-15 ENCOUNTER — Other Ambulatory Visit: Payer: Self-pay

## 2020-05-15 ENCOUNTER — Other Ambulatory Visit
Admission: RE | Admit: 2020-05-15 | Discharge: 2020-05-15 | Disposition: A | Discharge status: Home / Self Care | Payer: BC Managed Care – PPO | Source: Ambulatory Visit | Attending: General Surgery | Admitting: General Surgery

## 2020-05-15 DIAGNOSIS — Z01812 Encounter for preprocedural laboratory examination: Secondary | ICD-10-CM | POA: Insufficient documentation

## 2020-05-15 DIAGNOSIS — Z20822 Contact with and (suspected) exposure to covid-19: Secondary | ICD-10-CM | POA: Insufficient documentation

## 2020-05-16 ENCOUNTER — Ambulatory Visit: Payer: BC Managed Care – PPO | Admitting: Skilled Nursing Facility1

## 2020-05-16 ENCOUNTER — Encounter: Payer: BC Managed Care – PPO | Attending: Surgery | Admitting: Skilled Nursing Facility1

## 2020-05-16 DIAGNOSIS — E669 Obesity, unspecified: Secondary | ICD-10-CM | POA: Insufficient documentation

## 2020-05-16 LAB — SARS CORONAVIRUS 2 (TAT 6-24 HRS): SARS Coronavirus 2: NEGATIVE

## 2020-05-16 NOTE — Progress Notes (Signed)
Supervised Weight Loss Visit Bariatric Nutrition Education  Planned Surgery: sleeve gastrectomy  Appt conducted virtually: pt identified by name and DOB: pt agrees to limitations of this visit type  1 out of 6 SWL Appointments   Star given previously: no  NUTRITION ASSESSMENT  Anthropometrics  Start weight at [facility]: 307 lbs (date: 04/18/20) Today's weight: virtual appt: pt does not have means to weigh at home  BMI:   Clinical  Medical hx: depression, thyroid disease, anxiety, asthma Medications: see list Labs:  Notable signs/symptoms: a lot of pressure and pain in her thighs and behind her knees Any previous deficiencies? No  Lifestyle & Dietary Hx  Pt states she is getting surgery this week so needed to quarantine and needs to do the radioactive seed. Pt states she eats frozen grapes instead of ice cream. Pt states she has noticed she is eating out of bordeum or habit rather than hunger. 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 Pt states when she is sitting there doing nothing and not working she will tweez her face or eat realizing this may be due to guilt for not working stating this is due to OCD. Pt state she started logging but did not continue stating she tried to pay attention to why she was eating. Pt states she was able to avoid sugary beverages and an excessive amount of coffee. Pt states she works with a Librarian, academic therapist every other month for OCD.  Pt states her husband is on the road during the week and he loves to cook so they eat more from home on the weekends rather than during the week.    Estimated daily fluid intake:  oz Supplements:  Current average weekly physical activity:   24-Hr Dietary Recall First Meal: toast + peanut butter or eggs + sausage + bacon Snack:  Second Meal: eaten out: tortilla soup Snack: frozen grapes  Third Meal: baked pork loin + green beans + sweet potatoes or eaten out Snack:   Beverages: water, limited to 1/2 cup coffee + sugar free creamer (does not like it)  Estimated Energy Needs Calories: 1500   NUTRITION DIAGNOSIS  Overweight/obesity ([place]-3.3) related to past poor dietary habits and physical inactivity as evidenced by patient w/ planned sleeve gastrectomy surgery following dietary guidelines for continued weight loss.   NUTRITION INTERVENTION  Nutrition counseling (C-1) and education (E-2) to facilitate bariatric surgery goals.  Pre-Op Goals Progress & New Goals Estimated Energy Needs Calories: 1500   NUTRITION DIAGNOSIS  Overweight/obesity ([place]-3.3) related to past poor dietary habits and physical inactivity as evidenced by patient w/ planned sleeve surgery following dietary guidelines for continued weight loss.    NUTRITION INTERVENTION  Nutrition counseling (C-1) and education (E-2) to facilitate bariatric surgery goals.   Pre-Op Goals Reviewed with the Patient . Track food and beverage intake (pen and paper, MyFitness Pal, Baritastic app, etc.): 4 days a week inclusive of 1 weekend day . Continue: Avoid all carbonated beverages (ex: soda, sparkling beverages)  . Continue: Avoid all sugar-sweetened beverages (ex: regular soda, sports drinks): avoid sweet tea: increase water with flavorings, have conversation with family explaining no more sweet tea is to be made and they can add sugar to their cup . Ask your doctor for a referral for pain in legs . NEW: take a break from working and go organize something to help with craving  Handouts Provided Include   N/A  Learning Style &  Readiness for Change Teaching method utilized: Visual & Auditory  Demonstrated degree of understanding via: Teach Back  Readiness Level: Action Barriers to learning/adherence to lifestyle change: none identified   RD's Notes for next Visit  . Assess pts aderhence to chosen goals    MONITORING & EVALUATION Dietary intake, weekly physical activity, body weight, and  pre-op goals in 1 month.   Next Steps  Patient is to return to [facility] for next SWL

## 2020-05-17 ENCOUNTER — Other Ambulatory Visit: Payer: Self-pay

## 2020-05-17 ENCOUNTER — Ambulatory Visit
Admission: RE | Admit: 2020-05-17 | Discharge: 2020-05-17 | Disposition: A | Discharge status: Home / Self Care | Payer: BC Managed Care – PPO | Source: Ambulatory Visit | Attending: General Surgery | Admitting: General Surgery

## 2020-05-17 DIAGNOSIS — D242 Benign neoplasm of left breast: Secondary | ICD-10-CM

## 2020-05-18 ENCOUNTER — Encounter (HOSPITAL_COMMUNITY): Payer: Self-pay | Admitting: General Surgery

## 2020-05-18 ENCOUNTER — Ambulatory Visit (HOSPITAL_COMMUNITY): Payer: BC Managed Care – PPO | Admitting: Anesthesiology

## 2020-05-18 ENCOUNTER — Ambulatory Visit (HOSPITAL_COMMUNITY)
Admission: RE | Admit: 2020-05-18 | Discharge: 2020-05-18 | Disposition: A | Discharge status: Home / Self Care | Payer: BC Managed Care – PPO | Attending: General Surgery | Admitting: General Surgery

## 2020-05-18 ENCOUNTER — Ambulatory Visit
Admission: RE | Admit: 2020-05-18 | Discharge: 2020-05-18 | Disposition: A | Discharge status: Home / Self Care | Payer: BC Managed Care – PPO | Source: Ambulatory Visit | Attending: General Surgery | Admitting: General Surgery

## 2020-05-18 ENCOUNTER — Encounter (HOSPITAL_COMMUNITY)
Admission: RE | Disposition: A | Discharge status: Home / Self Care | Payer: Self-pay | Source: Home / Self Care | Attending: General Surgery

## 2020-05-18 DIAGNOSIS — Z90710 Acquired absence of both cervix and uterus: Secondary | ICD-10-CM | POA: Insufficient documentation

## 2020-05-18 DIAGNOSIS — Z8042 Family history of malignant neoplasm of prostate: Secondary | ICD-10-CM | POA: Diagnosis not present

## 2020-05-18 DIAGNOSIS — N6082 Other benign mammary dysplasias of left breast: Secondary | ICD-10-CM | POA: Diagnosis not present

## 2020-05-18 DIAGNOSIS — Z809 Family history of malignant neoplasm, unspecified: Secondary | ICD-10-CM | POA: Insufficient documentation

## 2020-05-18 DIAGNOSIS — D242 Benign neoplasm of left breast: Secondary | ICD-10-CM | POA: Diagnosis not present

## 2020-05-18 DIAGNOSIS — Z803 Family history of malignant neoplasm of breast: Secondary | ICD-10-CM | POA: Diagnosis not present

## 2020-05-18 DIAGNOSIS — N6452 Nipple discharge: Secondary | ICD-10-CM | POA: Insufficient documentation

## 2020-05-18 DIAGNOSIS — Z8049 Family history of malignant neoplasm of other genital organs: Secondary | ICD-10-CM | POA: Diagnosis not present

## 2020-05-18 HISTORY — PX: RADIOACTIVE SEED GUIDED EXCISIONAL BREAST BIOPSY: SHX6490

## 2020-05-18 HISTORY — PX: BREAST DUCTAL SYSTEM EXCISION: SHX5242

## 2020-05-18 SURGERY — EXCISION DUCTAL SYSTEM BREAST
Anesthesia: General | Site: Breast | Laterality: Left

## 2020-05-18 MED ORDER — LACTATED RINGERS IV SOLN: INTRAVENOUS | Status: DC

## 2020-05-18 MED ORDER — LIDOCAINE 2% (20 MG/ML) 5 ML SYRINGE
INTRAMUSCULAR | Status: AC
  Filled 2020-05-18: qty 5

## 2020-05-18 MED ORDER — PROPOFOL 10 MG/ML IV BOLUS
INTRAVENOUS | Status: DC | PRN
  Administered 2020-05-18: 50 mg via INTRAVENOUS
  Administered 2020-05-18: 150 mg via INTRAVENOUS

## 2020-05-18 MED ORDER — FENTANYL CITRATE (PF) 250 MCG/5ML IJ SOLN
INTRAMUSCULAR | Status: AC
  Filled 2020-05-18: qty 5

## 2020-05-18 MED ORDER — CHLORHEXIDINE GLUCONATE 0.12 % MT SOLN
15.0000 mL | Freq: Once | OROMUCOSAL | Status: AC
  Administered 2020-05-18: 15 mL via OROMUCOSAL
  Filled 2020-05-18: qty 15

## 2020-05-18 MED ORDER — MIDAZOLAM HCL 5 MG/5ML IJ SOLN
INTRAMUSCULAR | Status: DC | PRN
  Administered 2020-05-18: 2 mg via INTRAVENOUS

## 2020-05-18 MED ORDER — MIDAZOLAM HCL 2 MG/2ML IJ SOLN
INTRAMUSCULAR | Status: AC
  Filled 2020-05-18: qty 2

## 2020-05-18 MED ORDER — PROPOFOL 1000 MG/100ML IV EMUL
INTRAVENOUS | Status: AC
  Filled 2020-05-18: qty 100

## 2020-05-18 MED ORDER — DEXAMETHASONE SODIUM PHOSPHATE 10 MG/ML IJ SOLN
INTRAMUSCULAR | Status: DC | PRN
  Administered 2020-05-18: 10 mg via INTRAVENOUS

## 2020-05-18 MED ORDER — ONDANSETRON HCL 4 MG/2ML IJ SOLN
INTRAMUSCULAR | Status: AC
  Filled 2020-05-18: qty 2

## 2020-05-18 MED ORDER — ORAL CARE MOUTH RINSE: 15.0000 mL | Freq: Once | OROMUCOSAL | Status: AC

## 2020-05-18 MED ORDER — 0.9 % SODIUM CHLORIDE (POUR BTL) OPTIME
TOPICAL | Status: DC | PRN
  Administered 2020-05-18: 1000 mL

## 2020-05-18 MED ORDER — ONDANSETRON HCL 4 MG/2ML IJ SOLN: 4.0000 mg | Freq: Once | INTRAMUSCULAR | Status: DC | PRN

## 2020-05-18 MED ORDER — FENTANYL CITRATE (PF) 100 MCG/2ML IJ SOLN
INTRAMUSCULAR | Status: DC | PRN
  Administered 2020-05-18 (×2): 50 ug via INTRAVENOUS

## 2020-05-18 MED ORDER — SCOPOLAMINE 1 MG/3DAYS TD PT72
MEDICATED_PATCH | TRANSDERMAL | Status: DC | PRN
  Administered 2020-05-18: 1 via TRANSDERMAL

## 2020-05-18 MED ORDER — BUPIVACAINE-EPINEPHRINE 0.25% -1:200000 IJ SOLN
INTRAMUSCULAR | Status: DC | PRN
  Administered 2020-05-18: 10 mL

## 2020-05-18 MED ORDER — CEFAZOLIN SODIUM-DEXTROSE 2-4 GM/100ML-% IV SOLN
2.0000 g | INTRAVENOUS | Status: AC
  Administered 2020-05-18: 2 g via INTRAVENOUS
  Filled 2020-05-18: qty 100

## 2020-05-18 MED ORDER — LIDOCAINE 2% (20 MG/ML) 5 ML SYRINGE
INTRAMUSCULAR | Status: DC | PRN
  Administered 2020-05-18: 40 mg via INTRAVENOUS

## 2020-05-18 MED ORDER — ENSURE PRE-SURGERY PO LIQD: 296.0000 mL | Freq: Once | ORAL | Status: DC

## 2020-05-18 MED ORDER — PROPOFOL 500 MG/50ML IV EMUL
INTRAVENOUS | Status: DC | PRN
  Administered 2020-05-18: 25 ug/kg/min via INTRAVENOUS

## 2020-05-18 MED ORDER — ONDANSETRON HCL 4 MG/2ML IJ SOLN
INTRAMUSCULAR | Status: DC | PRN
  Administered 2020-05-18: 4 mg via INTRAVENOUS

## 2020-05-18 MED ORDER — DEXAMETHASONE SODIUM PHOSPHATE 10 MG/ML IJ SOLN
INTRAMUSCULAR | Status: AC
  Filled 2020-05-18: qty 1

## 2020-05-18 MED ORDER — FENTANYL CITRATE (PF) 100 MCG/2ML IJ SOLN: 25.0000 ug | INTRAMUSCULAR | Status: DC | PRN

## 2020-05-18 MED ORDER — KETOROLAC TROMETHAMINE 15 MG/ML IJ SOLN
15.0000 mg | INTRAMUSCULAR | Status: AC
  Administered 2020-05-18: 15 mg via INTRAVENOUS
  Filled 2020-05-18: qty 1

## 2020-05-18 MED ORDER — ACETAMINOPHEN 500 MG PO TABS
1000.0000 mg | ORAL_TABLET | ORAL | Status: AC
  Administered 2020-05-18: 1000 mg via ORAL
  Filled 2020-05-18: qty 2

## 2020-05-18 MED ORDER — PROPOFOL 10 MG/ML IV BOLUS
INTRAVENOUS | Status: AC
  Filled 2020-05-18: qty 40

## 2020-05-18 MED ORDER — BUPIVACAINE-EPINEPHRINE (PF) 0.25% -1:200000 IJ SOLN
INTRAMUSCULAR | Status: AC
  Filled 2020-05-18: qty 30

## 2020-05-18 SURGICAL SUPPLY — 50 items
APPLIER CLIP 9.375 MED OPEN (MISCELLANEOUS) ×2
BENZOIN TINCTURE PRP APPL 2/3 (GAUZE/BANDAGES/DRESSINGS) IMPLANT
BINDER BREAST 3XL (GAUZE/BANDAGES/DRESSINGS) ×2 IMPLANT
BINDER BREAST LRG (GAUZE/BANDAGES/DRESSINGS) IMPLANT
BINDER BREAST XLRG (GAUZE/BANDAGES/DRESSINGS) IMPLANT
BLADE CLIPPER SURG (BLADE) IMPLANT
CANISTER SUCT 3000ML PPV (MISCELLANEOUS) ×2 IMPLANT
CHLORAPREP W/TINT 26 (MISCELLANEOUS) ×2 IMPLANT
CLIP APPLIE 9.375 MED OPEN (MISCELLANEOUS) ×1 IMPLANT
CLIP VESOCCLUDE MED 6/CT (CLIP) ×2 IMPLANT
CLOSURE STERI-STRIP 1/4X4 (GAUZE/BANDAGES/DRESSINGS) ×2 IMPLANT
COVER PROBE W GEL 5X96 (DRAPES) ×2 IMPLANT
COVER SURGICAL LIGHT HANDLE (MISCELLANEOUS) ×2 IMPLANT
COVER WAND RF STERILE (DRAPES) ×2 IMPLANT
DERMABOND ADVANCED (GAUZE/BANDAGES/DRESSINGS) ×1
DERMABOND ADVANCED .7 DNX12 (GAUZE/BANDAGES/DRESSINGS) ×1 IMPLANT
DEVICE DUBIN SPECIMEN MAMMOGRA (MISCELLANEOUS) ×2 IMPLANT
DRAPE CHEST BREAST 15X10 FENES (DRAPES) ×2 IMPLANT
DRAPE LAPAROTOMY 100X72 PEDS (DRAPES) ×2 IMPLANT
ELECT CAUTERY BLADE 6.4 (BLADE) ×2 IMPLANT
ELECT COATED BLADE 2.86 ST (ELECTRODE) ×2 IMPLANT
ELECT REM PT RETURN 9FT ADLT (ELECTROSURGICAL) ×2
ELECTRODE REM PT RTRN 9FT ADLT (ELECTROSURGICAL) ×1 IMPLANT
GLOVE BIO SURGEON STRL SZ7 (GLOVE) ×4 IMPLANT
GLOVE BIOGEL PI IND STRL 7.5 (GLOVE) ×1 IMPLANT
GLOVE BIOGEL PI INDICATOR 7.5 (GLOVE) ×1
GOWN STRL REUS W/ TWL LRG LVL3 (GOWN DISPOSABLE) ×2 IMPLANT
GOWN STRL REUS W/TWL LRG LVL3 (GOWN DISPOSABLE) ×4
KIT BASIN OR (CUSTOM PROCEDURE TRAY) ×2 IMPLANT
KIT MARKER MARGIN INK (KITS) ×2 IMPLANT
KIT TURNOVER KIT B (KITS) ×2 IMPLANT
LIGHT WAVEGUIDE WIDE FLAT (MISCELLANEOUS) IMPLANT
NEEDLE HYPO 25GX1X1/2 BEV (NEEDLE) ×2 IMPLANT
NS IRRIG 1000ML POUR BTL (IV SOLUTION) ×2 IMPLANT
PACK GENERAL/GYN (CUSTOM PROCEDURE TRAY) ×2 IMPLANT
PAD ARMBOARD 7.5X6 YLW CONV (MISCELLANEOUS) ×2 IMPLANT
PENCIL SMOKE EVACUATOR (MISCELLANEOUS) ×2 IMPLANT
RETRACTOR ONETRAX LX 90X20 (MISCELLANEOUS) ×2 IMPLANT
SPONGE LAP 4X18 RFD (DISPOSABLE) ×2 IMPLANT
STRIP CLOSURE SKIN 1/2X4 (GAUZE/BANDAGES/DRESSINGS) ×2 IMPLANT
SUT MNCRL AB 4-0 PS2 18 (SUTURE) ×2 IMPLANT
SUT MON AB 5-0 PS2 18 (SUTURE) ×2 IMPLANT
SUT SILK 2 0 SH (SUTURE) ×2 IMPLANT
SUT VIC AB 2-0 SH 27 (SUTURE) ×2
SUT VIC AB 2-0 SH 27XBRD (SUTURE) ×1 IMPLANT
SUT VIC AB 3-0 SH 27 (SUTURE) ×2
SUT VIC AB 3-0 SH 27X BRD (SUTURE) ×1 IMPLANT
SYR CONTROL 10ML LL (SYRINGE) ×2 IMPLANT
TOWEL GREEN STERILE (TOWEL DISPOSABLE) ×2 IMPLANT
TOWEL GREEN STERILE FF (TOWEL DISPOSABLE) ×2 IMPLANT

## 2020-05-18 NOTE — Anesthesia Postprocedure Evaluation (Signed)
Anesthesia Post Note  Patient: [name]  Procedure(s) Performed: EXCISION DUCTAL SYSTEM LEFT BREAST (Left Breast) RADIOACTIVE SEED GUIDED EXCISIONAL BREAST MASS (Left Breast)     Patient location during evaluation: PACU Anesthesia Type: General Level of consciousness: awake and alert Pain management: pain level controlled Vital Signs Assessment: post-procedure vital signs reviewed and stable Respiratory status: spontaneous breathing, nonlabored ventilation, respiratory function stable and patient connected to nasal cannula oxygen Cardiovascular status: blood pressure returned to baseline and stable Postop Assessment: no apparent nausea or vomiting Anesthetic complications: no   No complications documented.  Last Vitals:  Vitals:   05/18/20 0934 05/18/20 0949  BP: 109/70 108/72  Pulse: 80 82  Resp: 13 13  Temp:  36.9 C  SpO2: 93% 96%    Last Pain:  Vitals:   05/18/20 0949  TempSrc:   PainSc: 0-No pain                 Eilleen Davoli COKER

## 2020-05-18 NOTE — Op Note (Signed)
Preoperative diagnosis: left breast papilloma, nipple discharge Postoperative diagnosis: Same as above Procedure: Left breast radioactive seed guided excisional biopsy  Surgeon: Dr. Serita Grammes Anesthesia: General Estimated blood loss: Minimal Specimens: Left breast tissue marked with paint containing seed Complications: None Drains: None Special count was correct x2 at completion Disposition recovery stable condition  Indications: 79 yof who has noted left side spontaneous bloody nipple dc for some time. she underwent mm that showed b density breasts. the right side was negative. the left breast there is an oval circumscribed mass in outer left breast measuring 8 mm.  the left breast US shows a oval mass at 2 oclock 7 cm from nipple that measures 1.3 cm, the subareolar left breast showed mild duct ectasia and left axilla negative. 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   Procedure: She first had a radioactive seed placed at the breast center.  Informed consent was obtained she was then taken to the operating room. She was given antibiotics. SCDs were in place. She was placed under general anesthesia with an LMA. She was prepped and draped in the standard sterile surgical fashion. Surgical timeout was then performed.  I then infiltrated Marcaine throughout the luoq.  I then made a periareolar incision in order to hide the scar later. I dissected down with the neoprobe guidance to the seed. I then removed the seed and some of the surrounding tissue. This included the discharging ductal system. This was then passed off the table and marked with paint. I then did a mammogram that showed removal of the seed and clip. I then obtained hemostasis. I closed the breast tissue with 2-0 Vicryl suture to obliterate the defect. I then closed the skin with 3-0 Vicryl and 5-0 Monocryl. Glue Steri-Strips were applied. A binder was placed. She tolerated this well was extubated and  transferred to recovery stable.

## 2020-05-18 NOTE — Anesthesia Preprocedure Evaluation (Signed)

## 2020-05-18 NOTE — Interval H&P Note (Signed)
History and Physical Interval Note:  05/18/2020 7:10 AM  [name]  has presented today for surgery, with the diagnosis of NIPPLE DISCHARGE LEFT PAPILLOMA.  The various methods of treatment have been discussed with the patient and family. After consideration of risks, benefits and other options for treatment, the patient has consented to  Procedure(s): EXCISION DUCTAL SYSTEM LEFT BREAST (Left) RADIOACTIVE SEED GUIDED EXCISIONAL BREAST MASS (Left) as a surgical intervention.  The patient's history has been reviewed, patient examined, no change in status, stable for surgery.  I have reviewed the patient's chart and labs.  Questions were answered to the patient's satisfaction.     Rolm Bookbinder

## 2020-05-18 NOTE — H&P (Signed)
  45 yof who has noted left side spontaneous bloody nipple dc for some time. she noted some rippling in the left breast. she has fh in maternal ggm, maternal gm and paternal aunt, prostate in PGF, renal in maternal aunt. she has not had genetics. she underwent mm that showed b density breasts. the right side was negative. the left breast there is an oval circumscribed mass in outer left breast measuring 8 mm. US of the right axilla is fine where she thought she palpated something. the left breast US shows a oval mass at 2 oclock 7 cm from nipple that measures 1.3 cm, the subareolar left breast showed mild duct ectasia and left axilla negative. 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 she is here to discuss options with her son in law   Past Surgical History Rolm Bookbinder, MD; 04/17/2020 5:27 PM) Appendectomy  Gallbladder Surgery - Laparoscopic  Foot Surgery  Right. Hysterectomy (not due to cancer) - Complete  Oral Surgery  Hysterectomy (not due to cancer) - Partial  Shoulder Surgery  Right.  Diagnostic Studies History Rolm Bookbinder, MD; 04/17/2020 5:27 PM) Colonoscopy  never Mammogram  1-3 years ago within last year Pap Smear  1-5 years ago  Allergies Jess Barters, CMA; 04/17/2020 1:39 PM) Codeine Sulfate *ANALGESICS - OPIOID*  Allergies Reconciled   Medication History Rolm Bookbinder, MD; 04/17/2020 5:27 PM) Acetaminophen (500MG  Tablet, Oral as needed) Active. Calcium Carbonate (600MG  Tablet, Oral) Active. Medications Reconciled Zoloft (100MG  Tablet, Oral daily) Active. Synthroid (50MCG Tablet, Oral daily) Active.  Social History Rolm Bookbinder, MD; 04/17/2020 5:27 PM) Alcohol use  Occasional alcohol use. Caffeine use  Carbonated beverages, Coffee, Tea. No drug use  Tobacco use  Never smoker.  Family History Rolm Bookbinder, MD; 04/17/2020 5:27 PM) Arthritis  Brother, Father, Mother. Anesthetic complications  Mother. Cervical Cancer   Mother. Breast Cancer  Family Members In General, Mother. Depression  Mother, Sister, Pandora Leiter. Diabetes Mellitus  Father. Cancer  Family Members In General. Heart disease in female family member before age 65  Hypertension  Brother, Father, Son. Ischemic Bowel Disease  Mother. Migraine Headache  Sister. Prostate Cancer  Family Members In General. Heart disease in female family member before age 65  Kidney Disease  Family Members In General.    Physical Exam Rolm Bookbinder MD; 04/17/2020 3:34 PM) General Mental Status-Alert. Orientation-Oriented X3.  Breast Nipples-No Discharge. Breast Lump-No Palpable Breast Mass.  Lymphatic Head & Neck  General Head & Neck Lymphatics: Bilateral - Description - Normal. Axillary  General Axillary Region: Bilateral - Description - Normal. Note: no [place] adenopathy     Assessment & Plan Rolm Bookbinder MD; 04/17/2020 5:28 PM) NIPPLE DISCHARGE, BLOODY (N64.52) PAPILLOMA OF BREAST (D24.9) Story: Left breast seed guided excision, duct excision she has symptomatic lesion that might be source of dc. discussed seed guided exxcision with ductal system after identifying discharging ductal system intraoperatively. discussed surgery, risk and recovery with patient and will proceed

## 2020-05-18 NOTE — Discharge Instructions (Signed)
[facility] °Office Phone Number [phone] ° °POST OP INSTRUCTIONS °Take 400 mg of ibuprofen every 8 hours or 650 mg tylenol every 6 hours for next 72 hours then as needed. Use ice several times daily also. °Always review your discharge instruction sheet given to you by the facility where your surgery was performed. ° °IF YOU HAVE DISABILITY OR FAMILY LEAVE FORMS, YOU MUST BRING THEM TO THE OFFICE FOR PROCESSING.  DO NOT GIVE THEM TO YOUR DOCTOR. ° °1. A prescription for pain medication may be given to you upon discharge.  Take your pain medication as prescribed, if needed.  If narcotic pain medicine is not needed, then you may take acetaminophen (Tylenol), naprosyn (Alleve) or ibuprofen (Advil) as needed. °2. Take your usually prescribed medications unless otherwise directed °3. If you need a refill on your pain medication, please contact your pharmacy.  They will contact our office to request authorization.  Prescriptions will not be filled after 5pm or on week-ends. °4. You should eat very light the first 24 hours after surgery, such as soup, crackers, pudding, etc.  Resume your normal diet the day after surgery. °5. Most patients will experience some swelling and bruising in the breast.  Ice packs and a good support bra will help.  Wear the breast binder provided or a sports bra for 72 hours day and night.  After that wear a sports bra during the day until you return to the office. Swelling and bruising can take several days to resolve.  °6. It is common to experience some constipation if taking pain medication after surgery.  Increasing fluid intake and taking a stool softener will usually help or prevent this problem from occurring.  A mild laxative (Milk of Magnesia or Miralax) should be taken according to package directions if there are no bowel movements after 48 hours. °7. Unless discharge instructions indicate otherwise, you may remove your bandages 48 hours after surgery and you may  shower at that time.  You may have steri-strips (small skin tapes) in place directly over the incision.  These strips should be left on the skin for 7-10 days and will come off on their own.  If your surgeon used skin glue on the incision, you may shower in 24 hours.  The glue will flake off over the next 2-3 weeks.  Any sutures or staples will be removed at the office during your follow-up visit. °8. ACTIVITIES:  You may resume regular daily activities (gradually increasing) beginning the next day.  Wearing a good support bra or sports bra minimizes pain and swelling.  You may have sexual intercourse when it is comfortable. °a. You may drive when you no longer are taking prescription pain medication, you can comfortably wear a seatbelt, and you can safely maneuver your car and apply brakes. °b. RETURN TO WORK:  ______________________________________________________________________________________ °9. You should see your doctor in the office for a follow-up appointment approximately two weeks after your surgery.  Your doctor’s nurse will typically make your follow-up appointment when she calls you with your pathology report.  Expect your pathology report 3-4 business days after your surgery.  You may call to check if you do not hear from us after three days. °10. OTHER INSTRUCTIONS: _______________________________________________________________________________________________ _____________________________________________________________________________________________________________________________________ °_____________________________________________________________________________________________________________________________________ °_____________________________________________________________________________________________________________________________________ ° °WHEN TO CALL DR Tryston Gilliam: °1. Fever over 101.0 °2. Nausea and/or vomiting. °3. Extreme swelling or bruising. °4. Continued bleeding from  incision. °5. Increased pain, redness, or drainage from the incision. ° °The clinic staff is available to   answer your questions during regular business hours.  Please don’t hesitate to call and ask to speak to one of the nurses for clinical concerns.  If you have a medical emergency, go to the nearest emergency room or call 911.  A surgeon from [facility] is always on call at the hospital. ° °For further questions, please visit [email or web address] ° °

## 2020-05-18 NOTE — Transfer of Care (Signed)
Immediate Anesthesia Transfer of Care Note  Patient: [name]  Procedure(s) Performed: EXCISION DUCTAL SYSTEM LEFT BREAST (Left Breast) RADIOACTIVE SEED GUIDED EXCISIONAL BREAST MASS (Left Breast)  Patient Location: PACU  Anesthesia Type:General  Level of Consciousness: drowsy  Airway & Oxygen Therapy: Patient Spontanous Breathing and Patient connected to face mask oxygen  Post-op Assessment: Report given to RN and Post -op Vital signs reviewed and stable  Post vital signs: Reviewed and stable  Last Vitals:  Vitals Value Taken Time  BP 108/72 05/18/20 0834  Temp    Pulse 84 05/18/20 0834  Resp 23 05/18/20 0834  SpO2 100 % 05/18/20 0834  Vitals shown include unvalidated device data.  Last Pain:  Vitals:   05/18/20 0609  TempSrc: Oral  PainSc:       Patients Stated Pain Goal: 3 (03/47/42 5956)  Complications: No complications documented.

## 2020-05-18 NOTE — Anesthesia Procedure Notes (Signed)
Procedure Name: LMA Insertion Date/Time: 05/18/2020 7:34 AM Performed by: Candis Shine, CRNA Pre-anesthesia Checklist: Patient identified, Emergency Drugs available, Suction available and Patient being monitored Patient Re-evaluated:Patient Re-evaluated prior to induction Oxygen Delivery Method: Circle System Utilized Preoxygenation: Pre-oxygenation with 100% oxygen Induction Type: IV induction LMA: LMA inserted LMA Size: 4.0 Number of attempts: 1 Placement Confirmation: positive ETCO2 Tube secured with: Tape Dental Injury: Teeth and Oropharynx as per pre-operative assessment

## 2020-05-19 ENCOUNTER — Encounter (HOSPITAL_COMMUNITY): Payer: Self-pay | Admitting: General Surgery

## 2020-05-22 ENCOUNTER — Encounter: Payer: Self-pay | Admitting: Genetic Counselor

## 2020-05-22 ENCOUNTER — Telehealth: Payer: Self-pay | Admitting: Genetic Counselor

## 2020-05-22 DIAGNOSIS — Z1379 Encounter for other screening for genetic and chromosomal anomalies: Secondary | ICD-10-CM | POA: Insufficient documentation

## 2020-05-22 LAB — SURGICAL PATHOLOGY

## 2020-05-22 NOTE — Telephone Encounter (Signed)
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  This increases her risk for both melanoma and kidney cancer.  We will see her on Wednesday May 11 for more discussion of this result.

## 2020-05-24 ENCOUNTER — Inpatient Hospital Stay: Payer: BC Managed Care – PPO | Attending: Genetic Counselor | Admitting: Genetic Counselor

## 2020-05-24 DIAGNOSIS — Z801 Family history of malignant neoplasm of trachea, bronchus and lung: Secondary | ICD-10-CM

## 2020-05-24 DIAGNOSIS — Z803 Family history of malignant neoplasm of breast: Secondary | ICD-10-CM

## 2020-05-24 DIAGNOSIS — Z1379 Encounter for other screening for genetic and chromosomal anomalies: Secondary | ICD-10-CM | POA: Diagnosis not present

## 2020-05-24 DIAGNOSIS — Z8049 Family history of malignant neoplasm of other genital organs: Secondary | ICD-10-CM | POA: Diagnosis not present

## 2020-05-24 DIAGNOSIS — Z8051 Family history of malignant neoplasm of kidney: Secondary | ICD-10-CM

## 2020-05-24 DIAGNOSIS — Z8042 Family history of malignant neoplasm of prostate: Secondary | ICD-10-CM

## 2020-05-24 NOTE — Progress Notes (Signed)
GENETIC TEST RESULTS   Patient Name: [name] Patient Age: 45 y.o. Encounter Date: 05/24/2020  Referring Provider: Rolm Bookbinder, MD  I connected with  [name] on 05/24/2020 at 11 AM EDT by MyChart video conference and verified that I am speaking with the correct person using two identifiers.   Patient location: Home Provider location: [facility]   [name] was seen in the [place] clinic on May 24, 2020 due to a family history of cancer and concern regarding a hereditary predisposition to cancer in the family. Please refer to the prior Genetics clinic note for more information regarding [name]'s medical and family histories and our assessment at the time.   FAMILY HISTORY:  We obtained a detailed, 4-generation family history.  Significant diagnoses are listed below: Family History  Problem Relation Age of Onset  . Cervical cancer Mother   . Diabetes Father   . Hypertension Father   . Kidney cancer Maternal Aunt        dx in her 65s  . Cervical cancer Maternal Aunt   . Arthritis Maternal Grandmother   . Breast cancer Maternal Grandmother        dx in her 53s  . Prostate cancer Maternal Grandfather   . Anxiety disorder Son   . Asperger's syndrome Son   . OCD Son   . ADD / ADHD Son   . Depression Son   . Breast cancer Paternal Aunt        dx > 50  . Lung cancer Paternal Grandmother   . Lung cancer Paternal Grandfather   . Breast cancer Other        PGMs sister d. 46  . Breast cancer Other        PGMs sister d. 4    The patient has three sons who are cancer free. She has a full brother and maternal half sister who are cancer free, however her sister had a benign mass removed from her breast at age 5.  Both parents are living.  The patients mother had cervical cancer in her 77's.  She has one sister who had cervical cancer in her 91's and kidney cancer in her 87's.  The maternal grandmother is living and had breast cancer in her 53's.  She has a  brother who died of stomach cancer, a sister with skin cancer and another sister who died of breast cancer.  The MGM's mother had stomach cancer.  The maternal grandfather died of prostate cancer.  His mother had breast cancer.  The patient's father has parathyroid disease.  He was also adopted.  He had one full sister who is cancer free and a maternal half sister who died of heart disease and one who had breast cancer over age 70.  The paternal grandparents are deceased, both from lung cancer.  The paternal grandmother had two sisters with breast cancer who died at 1 and 70.  One had a daughter with breast cancer.  The paternal grandfather had both of his parents die of cancer.  [name] is unaware of previous family history of genetic testing for hereditary cancer risks. Patient's maternal ancestors are of [place], [place] and [place] descent, and paternal ancestors are of [place], [place] and [place] descent. There is no reported Ashkenazi Jewish ancestry. There is no known consanguinity.  GENETIC TESTING:  At the time of [name]'s visit, we recommended she pursue genetic testing of the CancerNext-Expanded+RNA cancer panel. This test, which included sequencing and deletion/duplication analysis  of CancerNext-Expanded+RNAinsight panel, was performed at [facility]. 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 This gene mutation increases the risk to develop melanoma and kidney cancer.  Clinical condition Malignant melanoma is a neoplasm, or cancer, of melanocytes, the cells that produce pigment. Melanoma most often occurs in the skin, but may also affect the eyes, ears, gastrointestinal tract, and oral and genital membranes. Cutaneous melanoma is considered the most lethal skin cancer if not detected and treated during its early stages (PMID: 50539767). Approximately 5-10% of cases are familial (PMID: 34193790). The c.952G>A (p.Glu318Lys) variant in  MITF, also known as E318K, is associated with an increased risk of melanoma (PMID: 24097353, 29924268, 34196222, 97989211, 94174081, 44818563, 14970263). The risks are not yet established; however, studies suggest the risk may be 3- to 5-fold higher than the general population risk (PMID: 78588502, 77412878). This variant has been associated with features including high nevi count (>200), fair skin, non-blue eye color, and early-onset melanoma (under age 48) (PMID: 67672094, 70962836, 62947654, 65035465). Additionally, there is evidence to suggest this variant may predispose to fast-growing melanomas (PMID: 68127517).  Studies showed an overrepresentation of renal cell carcinoma in individuals with this variant (PMID: 00174944, 96759163, 84665993, 57017793, 90300923 ); however, the studies were performed on relatively small patient populations and these findings have not been independently replicated. Therefore, the risk for renal cancer in individuals with the MITF E318K variant is currently unknown, but could be increased up to 5-fold.  MANAGEMENT:  While there remains lack of a clear consensus on dermatologic management and surveillance guidelines for individuals at increased risk for melanoma, heightened screening may result in early detection and removal of cutaneous lesions at premalignant or early stages, associated with a more favorable prognosis (PMID: 30076226).  There are currently no published medical management guidelines for individuals at increased cancer risk due to a MITF mutation. We expect that medical management guidelines for the MITF gene may become available over time, thus we encouraged [name] to contact us regularly for any updates. While there are no established screening or surveillance guidelines for individuals with the pathogenic E318K variant in MITF, the following recommendations have been suggested (PMID: 33354562, 56389373):  Regarding melanoma risk management: Follow  melanoma prevention programs that include:  Sun protection strategies  Regular formal dermatologic evaluations  Monthly self-examination of the skin  Consider immediate and urgent dermatologic follow-up for any new lesions due to evidence suggesting the E318K variant may be associated with fast-growing melanomas (PMID: 42876811).  Screening of all family members  Because data suggests those who test negative for a familial variant may still have an increased risk of developing melanoma (due to other shared and environmental risk factors), such relatives should remain under careful dermatologic surveillance and strict sun protection (PMID: 57262035, 59741638).  An individual's cancer risk and medical management are not determined by genetic test results alone. Overall cancer risk assessment incorporates additional factors, including personal medical history, family history, and any available genetic information that may result in a personalized plan for cancer prevention and surveillance.  Ms. Eifler does not have a dermatologist.  We have referred her back to her primary care provider to discuss a referral for a dermatologist.  Regarding kidney cancer risk management: We suggest at least annual imaging (as determined by your Urologist) for kidney tumors and regular clinical exams with a Urologist.  We discussed that many times imaging of the kidneys can find unexpected findings, many times that are benign.  Therefore,  if renal screening commences patients should expect that any findings will need to be followed up upon and tracked.  Ms. Desjardin does not have a urologist.  We recommended talking with her primary care provider for a referral to urology, or contacting [facility] at [phone].  FAMILY MEMBERS: Even though data regarding MITF is preliminary, knowing if a pathogenic variant is present is advantageous. At-risk relatives can be identified, enabling pursuit of a diagnostic  evaluation. Further, the available information regarding hereditary cancer susceptibility genes is constantly evolving and more clinically relevant data regarding MITF are likely to become available in the near future. Awareness of this cancer predisposition encourages patients and their providers to inform at-risk family members, to consider implementing proposed screening protocols, and to be vigilant in maintaining close and regular contact with their local genetics clinic in anticipation of new information.It is important that all of Ms. Sokolow's relatives (both men and women) know of the presence of this gene mutation. Site-specific genetic testing can sort out who in the family is at risk and who is not.   Ms. Karney children and siblings have a 50% chance to have inherited this mutation. We recommend they have genetic testing for this same mutation, as identifying the presence of this mutation would allow them to also take advantage of risk-reducing measures. We feel that this is coming from the maternal side of the family, and therefore Ms. Breault's mother and maternal aunt should also consider genetic testing.  Especially since her maternal aunt has had kidney cancer in the past.  Summary: The information we discussed at the time of Ms. Sudano's appointment and which is summarized in this letter is what is known as of this date. With the rapid pace of medical research, new discoveries may modify our assessment and approach to her family in the future. We, therefore, encourage Ms. Schaber's to re-contact us periodically for information on advances in the field. Ms. Hornbaker may also call us with additional questions or updates regarding her personal and family cancer history.  Our contact number was provided. Ms. Annas questions were answered to her satisfaction today, and she knows she is welcome to call anytime with additional questions.   Less Woolsey P. Florene Glen, [place], [facility], Mudlogger [email or web address] phone: [phone]  The patient was seen for a total of 30 minutes in video genetic counseling.

## 2020-05-29 ENCOUNTER — Encounter: Payer: Self-pay | Admitting: Genetic Counselor

## 2020-06-06 NOTE — H&P (Signed)
45 yof who has noted left side spontaneous bloody nipple dc for some time. she noted some rippling in the left breast. she has fh in maternal ggm, maternal gm and paternal aunt, prostate in PGF, renal in maternal aunt. she has not had genetics. she underwent mm that showed b density breasts. the right side was negative. the left breast there is an oval circumscribed mass in outer left breast measuring 8 mm. US of the right axilla is fine where she thought she palpated something. the left breast US shows a oval mass at 2 oclock 7 cm from nipple that measures 1.3 cm, the subareolar left breast showed mild duct ectasia and left axilla negative. 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 she is here to discuss options with her son in law   Past Surgical History  Appendectomy  Gallbladder Surgery - Laparoscopic  Foot Surgery  Right. Hysterectomy (not due to cancer) - Complete  Oral Surgery  Hysterectomy (not due to cancer) - Partial  Shoulder Surgery  Right.  Diagnostic Studies History  Colonoscopy  never Mammogram  1-3 years ago within last year Pap Smear  1-5 years ago  Allergies  Codeine Sulfate *ANALGESICS - OPIOID*  Allergies Reconciled   Medication History  Acetaminophen (500MG  Tablet, Oral as needed) Active. Calcium Carbonate (600MG  Tablet, Oral) Active. Medications Reconciled Zoloft (100MG  Tablet, Oral daily) Active. Synthroid (50MCG Tablet, Oral daily) Active.  Social History  Alcohol use  Occasional alcohol use. Caffeine use  Carbonated beverages, Coffee, Tea. No drug use  Tobacco use  Never smoker.  Family History  Arthritis  Brother, Father, Mother. Anesthetic complications  Mother. Cervical Cancer  Mother. Breast Cancer  Family Members In General, Mother. Depression  Mother, Sister, Pandora Leiter. Diabetes Mellitus  Father. Cancer  Family Members In General. Heart disease in female family member before age 63  Hypertension   Brother, Father, Son. Ischemic Bowel Disease  Mother. Migraine Headache  Sister. Prostate Cancer  Family Members In General. Heart disease in female family member before age 49  Kidney Disease  Family Members In General.    Physical Exam  General Mental Status-Alert. Orientation-Oriented X3. Breast Nipples-No Discharge. Breast Lump-No Palpable Breast Mass. Lymphatic Head & Neck General Head & Neck Lymphatics: Bilateral - Description - Normal. Axillary General Axillary Region: Bilateral - Description - Normal. Note: no [place] adenopathy cv rrr Lungs clear     Assessment & Plan  NIPPLE DISCHARGE, BLOODY (N64.52) PAPILLOMA OF BREAST (D24.9) Story: Left breast seed guided excision, duct excision she has symptomatic lesion that might be source of dc. discussed seed guided exxcision with ductal system after identifying discharging ductal system intraoperatively. discussed surgery, risk and recovery with patient and will proceed

## 2020-06-20 ENCOUNTER — Encounter: Payer: BC Managed Care – PPO | Attending: Surgery | Admitting: Skilled Nursing Facility1

## 2020-06-20 ENCOUNTER — Other Ambulatory Visit: Payer: Self-pay

## 2020-06-20 DIAGNOSIS — E669 Obesity, unspecified: Secondary | ICD-10-CM | POA: Insufficient documentation

## 2020-06-20 NOTE — Progress Notes (Signed)
Supervised Weight Loss Visit Bariatric Nutrition Education  Planned Surgery: sleeve gastrectomy  Do not show pt weight   2 out of 6 SWL Appointments   Star given previously: no  NUTRITION ASSESSMENT  Anthropometrics  Start weight at [facility]: 307 lbs (date: 04/18/20) Today's weight: 306 pounds BMI: 54.31  Clinical  Medical hx: depression, thyroid disease, anxiety, asthma Medications: see list Labs:  Notable signs/symptoms: a lot of pressure and pain in her thighs and behind her knees Any previous deficiencies? No  Lifestyle & Dietary Hx Pt states her son is on the spectrum: high functioning which she states will be her motivation to make changes due to wanting him to be healthy.   Pt arrives anxious she has gained weight. Pt states she feels he gained weight because she was drinking soda again.  Pt states she has been stress eating due to still needing to get some tests done for her breast and finding out she has a genetic mutation that needs to be further tested but she does not have cancer! Pt states she needs to do battle with the insurance company for genetic testing which has been very stressful. Pt states she has cut back a little on eating out through the week due to it being so expensive. Pt states she wants to get a Rower for her house. Pt states she realized her house is a physical manifestation of how she is feeling for example if her house is a mess then she is most likely feeling stressed.    Estimated daily fluid intake:  oz Supplements:  Current average weekly physical activity: ADL's  24-Hr Dietary Recall First Meal: hamburger bun + butter Snack:  Second Meal: side salad + ranch or omelets  Snack: frozen grapes  Third Meal: nachos + cheese + ;ettuce + sour cream Snack:  Beverages: water, limited to 1/2 cup coffee + sugar free creamer (does not like it)  Estimated Energy Needs Calories: 1500   NUTRITION DIAGNOSIS  Overweight/obesity ([place]-3.3) related to  past poor dietary habits and physical inactivity as evidenced by patient w/ planned sleeve gastrectomy surgery following dietary guidelines for continued weight loss.   NUTRITION INTERVENTION  Nutrition counseling (C-1) and education (E-2) to facilitate bariatric surgery goals.  Pre-Op Goals Progress & New Goals Estimated Energy Needs Calories: 1500   NUTRITION DIAGNOSIS  Overweight/obesity ([place]-3.3) related to past poor dietary habits and physical inactivity as evidenced by patient w/ planned sleeve surgery following dietary guidelines for continued weight loss.    NUTRITION INTERVENTION  Nutrition counseling (C-1) and education (E-2) to facilitate bariatric surgery goals.   Pre-Op Goals Reviewed with the Patient . NEW/continue: Track food and beverage intake (pen and paper, MyFitness Pal, Baritastic app, etc.): 4 days a week inclusive of 1 weekend day . Continue: Avoid all carbonated beverages (ex: soda, sparkling beverages)  . NEW/Continue: Avoid all sugar-sweetened beverages (ex: regular soda, sports drinks): avoid sweet tea: increase water with flavorings, have conversation with family explaining no more sweet tea is to be made and they can add sugar to their cup . Ask your doctor for a referral for pain in legs . continue: take a break from working and go organize something to help with craving . NEW: more consistent and smaller meals . NEW: sit down with yourself and figure out why you hate cooking   Handouts Provided Include   Meal ideas sheet  Learning Style & Readiness for Change Teaching method utilized: Visual & Auditory  Demonstrated degree of understanding  via: Teach Back  Readiness Level: Action Barriers to learning/adherence to lifestyle change: none identified   RD's Notes for next Visit  . Assess pts aderhence to chosen goals    MONITORING & EVALUATION Dietary intake, weekly physical activity, body weight, and pre-op goals in 1 month.   Next Steps   Patient is to return to [facility] for next SWL

## 2020-07-19 ENCOUNTER — Other Ambulatory Visit: Payer: Self-pay

## 2020-07-19 ENCOUNTER — Encounter: Payer: BC Managed Care – PPO | Attending: Surgery | Admitting: Skilled Nursing Facility1

## 2020-07-19 DIAGNOSIS — E669 Obesity, unspecified: Secondary | ICD-10-CM | POA: Insufficient documentation

## 2020-07-19 NOTE — Progress Notes (Signed)
Supervised Weight Loss Visit Bariatric Nutrition Education  Planned Surgery: sleeve gastrectomy  Do not show pt weight   3 out of 6 SWL Appointments   Star given previously: no  NUTRITION ASSESSMENT  Anthropometrics  Start weight at [facility]: 307 lbs (date: 04/18/20) Today's weight: 307 pounds BMI: 54.38  Clinical  Medical hx: depression, thyroid disease, anxiety, asthma Medications: see list Labs:  Notable signs/symptoms: a lot of pressure and pain in her thighs and behind her knees Any previous deficiencies? No  Lifestyle & Dietary Hx  Pt states her son is on the spectrum: high functioning which she states will be her motivation to make changes due to wanting him to be healthy.   Pt states she has avoiding chips and little debbies for the better part of the month. Pt state she has been having a lot of bloating lately stating she thinks milk might be messing with her.  Pt states logging has helped her to not eat out as much.    Estimated daily fluid intake:  oz Supplements:  Current average weekly physical activity: ADL's  24-Hr Dietary Recall First Meal: cereal + 2% milk or eggs + sausage + biscuit  Snack:  Second Meal: [place] cheese lettuce wrap pickles Snack: frozen grapes  Third Meal: peanut butter sandwich Snack:  Beverages: water,  coffee + regular creamer, soda  Estimated Energy Needs Calories: 1500   NUTRITION DIAGNOSIS  Overweight/obesity ([place]-3.3) related to past poor dietary habits and physical inactivity as evidenced by patient w/ planned sleeve gastrectomy surgery following dietary guidelines for continued weight loss.   NUTRITION INTERVENTION  Nutrition counseling (C-1) and education (E-2) to facilitate bariatric surgery goals.  Pre-Op Goals Progress & New Goals Estimated Energy Needs Calories: 1500   NUTRITION DIAGNOSIS  Overweight/obesity ([place]-3.3) related to past poor dietary habits and physical inactivity as evidenced by patient w/ planned  sleeve surgery following dietary guidelines for continued weight loss.    NUTRITION INTERVENTION  Nutrition counseling (C-1) and education (E-2) to facilitate bariatric surgery goals.   Pre-Op Goals Reviewed with the Patient continue: Track food and beverage intake (pen and paper, MyFitness Pal, Baritastic app, etc.): 4 days a week inclusive of 1 weekend day Continue: Avoid all carbonated beverages (ex: soda, sparkling beverages)  Continue: Avoid all sugar-sweetened beverages (ex: regular soda, sports drinks): avoid sweet tea: increase water with flavorings, have conversation with family explaining no more sweet tea is to be made and they can add sugar to their cup Ask your doctor for a referral for pain in legs continue: take a break from working and go organize something to help with craving continue: more consistent and smaller meals Continue: sit down with yourself and figure out why you hate cooking; thins seh just physically hates doing it NEW: keep logging your foods and be sure to write down how you are feeling physically  NEW: prep more meals to be able to have more meals from home NEW: increase physical activity   Handouts Previously Provided Include  Meal ideas sheet  Learning Style & Readiness for Change Teaching method utilized: Visual & Auditory  Demonstrated degree of understanding via: Teach Back  Readiness Level: Action Barriers to learning/adherence to lifestyle change: none identified   RD's Notes for next Visit  Assess pts aderhence to chosen goals    MONITORING & EVALUATION Dietary intake, weekly physical activity, body weight, and pre-op goals in 1 month.   Next Steps  Patient is to return to [facility] for next SWL

## 2020-08-16 ENCOUNTER — Encounter: Payer: Self-pay | Admitting: Adult Health

## 2020-08-16 ENCOUNTER — Other Ambulatory Visit: Payer: Self-pay

## 2020-08-16 ENCOUNTER — Ambulatory Visit: Payer: BC Managed Care – PPO | Admitting: Adult Health

## 2020-08-16 DIAGNOSIS — F331 Major depressive disorder, recurrent, moderate: Secondary | ICD-10-CM

## 2020-08-16 DIAGNOSIS — F902 Attention-deficit hyperactivity disorder, combined type: Secondary | ICD-10-CM | POA: Diagnosis not present

## 2020-08-16 DIAGNOSIS — F422 Mixed obsessional thoughts and acts: Secondary | ICD-10-CM | POA: Diagnosis not present

## 2020-08-16 DIAGNOSIS — F411 Generalized anxiety disorder: Secondary | ICD-10-CM

## 2020-08-16 MED ORDER — FLUOXETINE HCL 40 MG PO CAPS: ORAL_CAPSULE | ORAL | 5 refills | Status: DC

## 2020-08-16 MED ORDER — AMPHETAMINE-DEXTROAMPHETAMINE 30 MG PO TABS: ORAL_TABLET | ORAL | 0 refills | Status: DC

## 2020-08-16 MED ORDER — LURASIDONE HCL 40 MG PO TABS: ORAL_TABLET | ORAL | 0 refills | Status: DC

## 2020-08-16 MED ORDER — LAMOTRIGINE 150 MG PO TABS: ORAL_TABLET | ORAL | 5 refills | Status: DC

## 2020-08-16 NOTE — Progress Notes (Signed)
Emya Phair [id number] 30-Aug-1975 45 y.o.  Subjective:   Patient ID:  [name] is a 44 y.o. (DOB 05/24/1975) female.  Chief Complaint: No chief complaint on file.   HPI Ercel Stroh presents to the office today for follow-up of ADHD, GAD, MDD, insomnia.   Describes mood today as "ok". Pleasant. Mood symptoms - reports decreased depression, anxiety, and irritability - "a little of all of it". Denies recent panic attacks tating "I'm having difficulties staying positive and motivated". Recent surgery - partial mastectomy - working with [facility]. Son with upcoming surgery - deviated septum. She and husband doing well. Taking medications as prescribed.  Energy levels decreased. Active, does not have a regular exercise routine.  Enjoys some usual interests and activities. Married. Lives husband of 64 years and 3 sons. Spending time with family. Appetite varies. Denies recent binge eating. Weight stable - 300 pounds. Has started the process of bariatric assistance. Sleeping difficulties. Averages 4 to 5 hours.  Focus and concentration stable with Adderall. Completing tasks. Managing aspects of household. Work as a Geophysicist/field seismologist.  Denies SI or HI.  Denies AH or VH.  Previous medications: Seroquel - tremors, Wellbutrin  PHQ2-9    Flowsheet Row Nutrition [facility]/18/2020 [facility] Visit from 07/29/2016 in [facility]  PHQ-2 Total Score 0 6  PHQ-9 Total Score -- 21      Flowsheet Row Admission (Discharged) from 05/18/2020 in [facility] from 05/10/2020 in [facility] PREADMISSION TESTING  C-SSRS RISK CATEGORY Low Risk No Risk        Review of Systems:  Review of Systems  Musculoskeletal:  Negative for gait problem.  Neurological:  Negative for tremors.  Psychiatric/Behavioral:         Please refer to HPI   Medications: I have reviewed the patient's current  medications.  Current Outpatient Medications  Medication Sig Dispense Refill   lurasidone (LATUDA) 40 MG TABS tablet Take 1/2 tablet daily for 7 days, then one tablet daily. 90 tablet 0   acetaminophen (TYLENOL) 500 MG tablet Take 1,000 mg by mouth every 6 (six) hours as needed for moderate pain.     albuterol (VENTOLIN HFA) 108 (90 Base) MCG/ACT inhaler Inhale 1-2 puffs into the lungs every 6 (six) hours as needed for wheezing or shortness of breath.     amphetamine-dextroamphetamine (ADDERALL) 30 MG tablet Take one tablet in the morning and 1/2 tablet at lunch. 45 tablet 0   [START ON 09/13/2020] amphetamine-dextroamphetamine (ADDERALL) 30 MG tablet Take one tablet in the morning and 1/2 tablet at lunch. 45 tablet 0   calcium carbonate (TUMS - DOSED IN MG ELEMENTAL CALCIUM) 500 MG chewable tablet Chew 1 tablet by mouth daily as needed for indigestion or heartburn.     clonazePAM (KLONOPIN) 1 MG tablet Take 1 tablet (1 mg total) by mouth at bedtime. (Patient not taking: Reported on 05/05/2020) 30 tablet 2   FLUoxetine (PROZAC) 40 MG capsule TAKE 1 CAPSULE BY MOUTH DAILY 30 capsule 5   fluticasone (FLONASE) 50 MCG/ACT nasal spray Place 2 sprays into both nostrils daily as needed for allergies or rhinitis.     lamoTRIgine (LAMICTAL) 150 MG tablet Take one tablet at bedtime. 30 tablet 5   silver sulfADIAZINE (SILVADENE) 1 % cream Apply 1 application topically daily. (Patient not taking: Reported on 05/05/2020) 100 g 0   No current facility-administered medications for this visit.    Medication Side Effects:  None  Allergies:  Allergies  Allergen Reactions   Codeine Nausea And Vomiting   Other     Pt has severe nausea with anesthesia   Wound Dressing Adhesive Rash    Past Medical History:  Diagnosis Date   Anxiety    Arthritis    Bilateral knees and bilateral ankles   Asthma    Bipolar disorder (HCC)    Depression    Family history of adverse reaction to anesthesia    Post op nausea  and vomiting   Family history of breast cancer    Family history of kidney cancer    Family history of prostate cancer    GERD (gastroesophageal reflux disease)    Headache    Migraines   History of hiatal hernia    History of shoulder surgery    Hypothyroidism    Obesity    Plantar fasciitis 04/26/2013   Pneumonia    PONV (postoperative nausea and vomiting)    Post op nausea and vomiting    Past Medical History, Surgical history, Social history, and Family history were reviewed and updated as appropriate.   Please see review of systems for further details on the patient's review from today.   Objective:   Physical Exam:  There were no vitals taken for this visit.  Physical Exam Constitutional:      General: She is not in acute distress. Musculoskeletal:        General: No deformity.  Neurological:     Mental Status: She is alert and oriented to person, place, and time.     Coordination: Coordination normal.  Psychiatric:        Attention and Perception: Attention and perception normal. She does not perceive auditory or visual hallucinations.        Mood and Affect: Mood normal. Mood is not anxious or depressed. Affect is not labile, blunt, angry or inappropriate.        Speech: Speech normal.        Behavior: Behavior normal.        Thought Content: Thought content normal. Thought content is not paranoid or delusional. Thought content does not include homicidal or suicidal ideation. Thought content does not include homicidal or suicidal plan.        Cognition and Memory: Cognition and memory normal.        Judgment: Judgment normal.     Comments: Insight intact    Lab Review:     Component Value Date/Time   NA 142 10/29/2007 2043   K 4.3 10/29/2007 2043   CL 102 10/29/2007 2043   GLUCOSE 97 10/29/2007 2043   BUN 13 10/29/2007 2043   CREATININE 0.9 10/29/2007 2043       Component Value Date/Time   WBC 9.6 05/10/2020 1317   RBC 4.66 05/10/2020 1317   HGB 13.6  05/10/2020 1317   HCT 43.2 05/10/2020 1317   PLT 338 05/10/2020 1317   MCV 92.7 05/10/2020 1317   MCH 29.2 05/10/2020 1317   MCHC 31.5 05/10/2020 1317   RDW 12.6 05/10/2020 1317    No results found for: POCLITH, LITHIUM   No results found for: PHENYTOIN, PHENOBARB, VALPROATE, CBMZ   .res Assessment: Plan:    Plan:  PDMP reviewed   1. Prozac '40mg'$  daily 2. Adderall '30mg'$  daily - 1 and 1/2 tabs daily 3. Lamictal '150mg'$  daily 4. Add Latuda '40mg'$  - 1/2 tablet daily x 7 days, then one tablet daily  Patient ADHD testing completed in office -  Conner's. Meets DSM-5 criteria for Adult ADHD.   RTC 8 weeks  Patient advised to contact office with any questions, adverse effects, or acute worsening in signs and symptoms.  Discussed potential benefits, risks, and side effects of stimulants with patient to include increased heart rate, palpitations, insomnia, increased anxiety, increased irritability, or decreased appetite.  Instructed patient to contact office if experiencing any significant tolerability issues.  Counseled patient regarding potential benefits, risks, and side effects of Lamictal to include potential risk of Stevens-Johnson syndrome. Advised patient to stop taking Lamictal and contact office immediately if rash develops and to seek urgent medical attention if rash is severe and/or spreading quickly.   Diagnoses and all orders for this visit:  Mixed obsessional thoughts and acts -     FLUoxetine (PROZAC) 40 MG capsule; TAKE 1 CAPSULE BY MOUTH DAILY  Major depressive disorder, recurrent episode, moderate (HCC) -     lamoTRIgine (LAMICTAL) 150 MG tablet; Take one tablet at bedtime.  Attention deficit hyperactivity disorder (ADHD), combined type -     amphetamine-dextroamphetamine (ADDERALL) 30 MG tablet; Take one tablet in the morning and 1/2 tablet at lunch. -     amphetamine-dextroamphetamine (ADDERALL) 30 MG tablet; Take one tablet in the morning and 1/2 tablet at  lunch.  Generalized anxiety disorder -     amphetamine-dextroamphetamine (ADDERALL) 30 MG tablet; Take one tablet in the morning and 1/2 tablet at lunch.  Other orders -     lurasidone (LATUDA) 40 MG TABS tablet; Take 1/2 tablet daily for 7 days, then one tablet daily.    Please see After Visit Summary for patient specific instructions.  Future Appointments  Date Time Provider [place]  08/24/2020  2:00 PM Scotece, Francesco Sor, RD [facility]    No orders of the defined types were placed in this encounter.   -------------------------------

## 2020-08-24 ENCOUNTER — Encounter: Payer: BC Managed Care – PPO | Attending: Surgery | Admitting: Skilled Nursing Facility1

## 2020-08-24 DIAGNOSIS — E669 Obesity, unspecified: Secondary | ICD-10-CM | POA: Diagnosis present

## 2020-08-24 NOTE — Progress Notes (Signed)
Supervised Weight Loss Visit Bariatric Nutrition Education  Planned Surgery: sleeve gastrectomy  Do not show pt weight   4 out of 6 SWL Appointments   Appt conducted virtually pt consents to limitation of this visit type' pt identified by name and DOB  Star given previously: no  NUTRITION ASSESSMENT  Anthropometrics  Start weight at [facility]: 307 lbs (date: 04/18/20) Today's weight: 307 pounds BMI: 54.38  Clinical  Medical hx: depression, thyroid disease, anxiety, asthma Medications: see list Labs:  Notable signs/symptoms: a lot of pressure and pain in her thighs and behind her knees Any previous deficiencies? No  Lifestyle & Dietary Hx  Pt states her son is on the spectrum: high functioning which she states will be her motivation to make changes due to wanting him to be healthy.    Pt states she has had some mental health medication changes. Pt state she has been able to avoid snacking due tot not buying. Pt states  has control over the amount of foods she has been eating stating the change is driven by not wanting to feel sick anymore. Pt states eating more at home has been going much better. Pt states she has gotten back to work which has helped her to move more.   Estimated daily fluid intake:  oz Supplements:  Current average weekly physical activity: ADL's  24-Hr Dietary Recall First Meal: 2 eggs or 1 toast Snack: banana Second Meal: [place] cheese lettuce wrap pickles or  Snack: frozen grapes  Third Meal: peanut butter sandwich or salad and ribs Snack:  Beverages: water,  coffee + regular creamer, soda, juice, 2% milk, half and half tea  Estimated Energy Needs Calories: 1500   NUTRITION DIAGNOSIS  Overweight/obesity ([place]-3.3) related to past poor dietary habits and physical inactivity as evidenced by patient w/ planned sleeve gastrectomy surgery following dietary guidelines for continued weight loss.   NUTRITION INTERVENTION  Nutrition counseling (C-1) and  education (E-2) to facilitate bariatric surgery goals.  Pre-Op Goals Progress & New Goals Estimated Energy Needs Calories: 1500   NUTRITION DIAGNOSIS  Overweight/obesity ([place]-3.3) related to past poor dietary habits and physical inactivity as evidenced by patient w/ planned sleeve surgery following dietary guidelines for continued weight loss.    NUTRITION INTERVENTION  Nutrition counseling (C-1) and education (E-2) to facilitate bariatric surgery goals.   Pre-Op Goals Reviewed with the Patient continue: Track food and beverage intake (pen and paper, MyFitness Pal, Baritastic app, etc.): 4 days a week inclusive of 1 weekend day Continue: Avoid all carbonated beverages (ex: soda, sparkling beverages)  Continue: Avoid all sugar-sweetened beverages (ex: regular soda, sports drinks): avoid sweet tea: increase water with flavorings, have conversation with family explaining no more sweet tea is to be made and they can add sugar to their cup Ask your doctor for a referral for pain in legs continue: take a break from working and go organize something to help with craving continue: more consistent and smaller meals Continue: sit down with yourself and figure out why you hate cooking; thins seh just physically hates doing it continue: keep logging your foods and be sure to write down how you are feeling physically  continue: prep more meals to be able to have more meals from home continue: increase physical activity  NEW: document emotional feelings with your foods NEW: remind yourself you are doing the best you can, give yourself some grace NEW: find some physical activity you enjoy  Handouts Previously Provided Include  Meal ideas sheet  Learning  Style & Readiness for Change Teaching method utilized: Visual & Auditory  Demonstrated degree of understanding via: Teach Back  Readiness Level: Action Barriers to learning/adherence to lifestyle change: none identified   RD's Notes for next  Visit  Assess pts aderhence to chosen goals    MONITORING & EVALUATION Dietary intake, weekly physical activity, body weight, and pre-op goals in 1 month.   Next Steps  Patient is to return to [facility] for next SWL

## 2020-09-21 ENCOUNTER — Encounter: Payer: BC Managed Care – PPO | Attending: Surgery | Admitting: Skilled Nursing Facility1

## 2020-09-21 DIAGNOSIS — E669 Obesity, unspecified: Secondary | ICD-10-CM | POA: Insufficient documentation

## 2020-09-21 NOTE — Progress Notes (Signed)
Supervised Weight Loss Visit Bariatric Nutrition Education  Planned Surgery: sleeve gastrectomy  Do not show pt weight   5 out of 6 SWL Appointments   Appt conducted virtually pt consents to limitation of this visit type' pt identified by name and DOB  Star given previously: no  NUTRITION ASSESSMENT  Anthropometrics  Start weight at [facility]: 307 lbs (date: 04/18/20) Today's weight: 307 pounds BMI: 54.38  Clinical  Medical hx: depression, thyroid disease, anxiety, asthma Medications: see list Labs:  Notable signs/symptoms: a lot of pressure and pain in her thighs and behind her knees Any previous deficiencies? No  Lifestyle & Dietary Hx  Pt states her son is on the spectrum: high functioning which she states will be her motivation to make changes due to wanting him to be healthy.   Pt state she documented her feelings with calorically dense foods learning she does this with bordum and anxiety.  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 Pt states her strategies have been to not buy the snack foods.  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   Estimated daily fluid intake:  oz Supplements:  Current average weekly physical activity: 3-4 days a week 15-20 minute walks  24-Hr Dietary Recall First Meal: 2 eggs or 1 toast or cereal or protein bar Snack: banana or granola bar Second Meal: [place] cheese lettuce wrap pickles  Snack: frozen grapes  Third Meal: peanut butter sandwich or salad and ribs or vegetable soup Snack: sherbert or ice cream Beverages: water,  coffee + regular creamer, soda, juice, 2% milk, half and half tea  Estimated Energy Needs Calories: 1500   NUTRITION DIAGNOSIS  Overweight/obesity ([place]-3.3) related to past poor dietary habits and physical inactivity as evidenced by patient w/ planned sleeve gastrectomy surgery following dietary guidelines for continued weight loss.   NUTRITION INTERVENTION   Nutrition counseling (C-1) and education (E-2) to facilitate bariatric surgery goals.  Pre-Op Goals Progress & New Goals Estimated Energy Needs Calories: 1500   NUTRITION DIAGNOSIS  Overweight/obesity ([place]-3.3) related to past poor dietary habits and physical inactivity as evidenced by patient w/ planned sleeve surgery following dietary guidelines for continued weight loss.    NUTRITION INTERVENTION  Nutrition counseling (C-1) and education (E-2) to facilitate bariatric surgery goals.   Pre-Op Goals Reviewed with the Patient continue: Track food and beverage intake (pen and paper, MyFitness Pal, Baritastic app, etc.): 4 days a week inclusive of 1 weekend day Continue: Avoid all carbonated beverages (ex: soda, sparkling beverages)  Continue: Avoid all sugar-sweetened beverages (ex: regular soda, sports drinks): avoid sweet tea: increase water with flavorings, have conversation with family explaining no more sweet tea is to be made and they can add sugar to their cup Ask your doctor for a referral for pain in legs continue: take a break from working and go organize something to help with craving continue: more consistent and smaller meals Continue: sit down with yourself and figure out why you hate cooking; thins seh just physically hates doing it continue: keep logging your foods and be sure to write down how you are feeling physically  continue: prep more meals to be able to have more meals from home continue: increase physical activity  continue: document emotional feelings with your foods Continue: remind yourself you are doing the best you can, give yourself some grace Continue: find some physical activity you enjoy NEW: cut out all sugary drinks  NEW:  increase walking to 30 minutes  Handouts Previously Provided Include  Meal ideas sheet  Learning Style & Readiness for Change Teaching method utilized: Visual & Auditory  Demonstrated degree of understanding via: Teach Back   Readiness Level: Action Barriers to learning/adherence to lifestyle change: none identified   RD's Notes for next Visit  Assess pts aderhence to chosen goals    MONITORING & EVALUATION Dietary intake, weekly physical activity, body weight, and pre-op goals in 1 month.   Next Steps  Patient is to return to [facility] for next SWL

## 2020-10-06 ENCOUNTER — Telehealth: Payer: Self-pay | Admitting: Adult Health

## 2020-10-06 ENCOUNTER — Ambulatory Visit (INDEPENDENT_AMBULATORY_CARE_PROVIDER_SITE_OTHER): Payer: BC Managed Care – PPO | Admitting: Psychology

## 2020-10-06 ENCOUNTER — Other Ambulatory Visit: Payer: Self-pay

## 2020-10-06 DIAGNOSIS — F411 Generalized anxiety disorder: Secondary | ICD-10-CM

## 2020-10-06 DIAGNOSIS — F4323 Adjustment disorder with mixed anxiety and depressed mood: Secondary | ICD-10-CM | POA: Diagnosis not present

## 2020-10-06 DIAGNOSIS — F902 Attention-deficit hyperactivity disorder, combined type: Secondary | ICD-10-CM

## 2020-10-06 MED ORDER — AMPHETAMINE-DEXTROAMPHETAMINE 30 MG PO TABS: ORAL_TABLET | ORAL | 0 refills | Status: DC

## 2020-10-06 NOTE — Telephone Encounter (Signed)
Next visit is 10/18/20. [name] needs a RX for Adderall 30 mg.   This is the only pharmacy that she can get her Adderall 30 mg from.  Please send to [facility], [place], [place], [place] [address]. Phone number is [phone] and fax # is [phone].

## 2020-10-06 NOTE — Telephone Encounter (Signed)
Pended.

## 2020-10-17 ENCOUNTER — Telehealth: Payer: BC Managed Care – PPO | Admitting: Skilled Nursing Facility1

## 2020-10-17 ENCOUNTER — Encounter: Payer: BC Managed Care – PPO | Attending: Surgery | Admitting: Skilled Nursing Facility1

## 2020-10-17 ENCOUNTER — Other Ambulatory Visit: Payer: Self-pay

## 2020-10-17 DIAGNOSIS — E669 Obesity, unspecified: Secondary | ICD-10-CM | POA: Insufficient documentation

## 2020-10-17 NOTE — Progress Notes (Signed)
Supervised Weight Loss Visit Bariatric Nutrition Education  Planned Surgery: sleeve gastrectomy  Do not show pt weight   6 out of 6 SWL Appointments   Pt completed visits.   Pt has cleared nutrition requirements.    Star given previously: no  NUTRITION ASSESSMENT  Anthropometrics  Start weight at [facility]: 307 lbs (date: 04/18/20) Today's weight: 297.3 pounds BMI: 52.70  Clinical  Medical hx: depression, thyroid disease, anxiety, asthma Medications: see list Labs:  Notable signs/symptoms: a lot of pressure and pain in her thighs and behind her knees Any previous deficiencies? No  Lifestyle & Dietary Hx  Pt states her son is on the spectrum: high functioning which she states will be her motivation to make changes due to wanting him to be healthy.    Pt states she was on vacation in [place] with family and had a great time.   Pt states in the last 6 months she has learned realizing in the moment she is eating it is more a habit than hunger, needing to physically move more, pay attention to portion sizes and taking her time when eating.   Pt states she feels after surgery she may have to focus very intentionally on: staying focused after surgery.   Estimated daily fluid intake:  oz Supplements:  Current average weekly physical activity: 3-4 days a week 15-20 minute walks  24-Hr Dietary Recall First Meal: 2 eggs or 1 toast or cereal or protein bar Snack: banana or granola bar Second Meal: [place] cheese lettuce wrap pickles  Snack: frozen grapes  Third Meal: peanut butter sandwich or salad and ribs or vegetable soup Snack: sherbert or ice cream Beverages: water,  coffee + regular creamer,  juice, 2% milk, half and half tea  Estimated Energy Needs Calories: 1500   NUTRITION DIAGNOSIS  Overweight/obesity ([place]-3.3) related to past poor dietary habits and physical inactivity as evidenced by patient w/ planned sleeve gastrectomy surgery following dietary guidelines for  continued weight loss.   NUTRITION INTERVENTION  Nutrition counseling (C-1) and education (E-2) to facilitate bariatric surgery goals.  Pre-Op Goals Progress & New Goals Estimated Energy Needs Calories: 1500   NUTRITION DIAGNOSIS  Overweight/obesity (-3.3) related to past poor dietary habits and physical inactivity as evidenced by patient w/ planned sleeve surgery following dietary guidelines for continued weight loss.    NUTRITION INTERVENTION  Nutrition counseling (C-1) and education (E-2) to facilitate bariatric surgery goals.   Pre-Op Goals Reviewed with the Patient continue: Track food and beverage intake (pen and paper, MyFitness Pal, Baritastic app, etc.): 4 days a week inclusive of 1 weekend day Continue: Avoid all carbonated beverages (ex: soda, sparkling beverages)  Continue: Avoid all sugar-sweetened beverages (ex: regular soda, sports drinks): avoid sweet tea: increase water with flavorings, have conversation with family explaining no more sweet tea is to be made and they can add sugar to their cup Ask your doctor for a referral for pain in legs continue: take a break from working and go organize something to help with craving continue: more consistent and smaller meals Continue: sit down with yourself and figure out why you hate cooking; thins seh just physically hates doing it continue: keep logging your foods and be sure to write down how you are feeling physically  continue: prep more meals to be able to have more meals from home continue: increase physical activity  continue: document emotional feelings with your foods Continue: remind yourself you are doing the best you can, give yourself some grace Continue: find  some physical activity you enjoy continue: cut out all sugary drinks  continue: increase walking to 30 minutes  Handouts Previously Provided Include  Meal ideas sheet  Learning Style & Readiness for Change Teaching method utilized: Visual &  Auditory  Demonstrated degree of understanding via: Teach Back  Readiness Level: Action Barriers to learning/adherence to lifestyle change: none identified   RD's Notes for next Visit  Assess pts aderhence to chosen goals    MONITORING & EVALUATION Dietary intake, weekly physical activity, body weight, and pre-op goals   Next Steps  Patient is to return to [facility] for next SWL  Pt has completed visits. No further supervised visits required/recomended

## 2020-10-18 ENCOUNTER — Ambulatory Visit (INDEPENDENT_AMBULATORY_CARE_PROVIDER_SITE_OTHER): Payer: BC Managed Care – PPO | Admitting: Adult Health

## 2020-10-18 ENCOUNTER — Encounter: Payer: Self-pay | Admitting: Adult Health

## 2020-10-18 DIAGNOSIS — F410 Panic disorder [episodic paroxysmal anxiety] without agoraphobia: Secondary | ICD-10-CM

## 2020-10-18 DIAGNOSIS — G4700 Insomnia, unspecified: Secondary | ICD-10-CM | POA: Diagnosis not present

## 2020-10-18 DIAGNOSIS — F331 Major depressive disorder, recurrent, moderate: Secondary | ICD-10-CM | POA: Diagnosis not present

## 2020-10-18 DIAGNOSIS — F902 Attention-deficit hyperactivity disorder, combined type: Secondary | ICD-10-CM

## 2020-10-18 DIAGNOSIS — F422 Mixed obsessional thoughts and acts: Secondary | ICD-10-CM

## 2020-10-18 DIAGNOSIS — F411 Generalized anxiety disorder: Secondary | ICD-10-CM

## 2020-10-18 NOTE — Progress Notes (Signed)
[name] [id number] 1975/08/23 45 y.o.  Subjective:   Patient ID:  [name] is a 45 y.o. (DOB 04-06-1975) female.  Chief Complaint: No chief complaint on file.   HPI [name] presents to the office today for follow-up of ADHD, GAD, MDD, insomnia.   Describes mood today as "ok". Pleasant. Mood symptoms - denies depression, anxiety, and irritability. Stating "I have been ok". Feels more positive, but lacking motivation. Denies recent panic attacks. Feels like medications are working well. Planning bariatric surgery. She and family doing well. Taking medications as prescribed.  Energy levels low. Active, does not have a regular exercise routine.  Enjoys some usual interests and activities. Married. Lives husband of 40 years and 3 sons. Spending time with family. Appetite varies. Weight stable - 297 pounds. Has started the process of bariatric assistance. Sleep is about the same. Averages 4 to 5 hours.  Focus and concentration stable with Adderall. Completing tasks. Managing aspects of household. Work as a Geophysicist/field seismologist.  Denies SI or HI.  Denies AH or VH.  Previous medications: Seroquel - tremors, Wellbutrin   PHQ2-9    Flowsheet Row Nutrition from [facility]/18/2020 [facility] from 07/29/2016 in [facility]  PHQ-2 Total Score 0 6  PHQ-9 Total Score -- 21      Flowsheet Row Admission (Discharged) from 05/18/2020 in [facility] from 05/10/2020 in [facility] PREADMISSION TESTING  C-SSRS RISK CATEGORY Low Risk No Risk        Review of Systems:  Review of Systems  Musculoskeletal:  Negative for gait problem.  Neurological:  Negative for tremors.  Psychiatric/Behavioral:         Please refer to HPI   Medications: I have reviewed the patient's current medications.  Current Outpatient Medications  Medication Sig Dispense Refill   acetaminophen (TYLENOL) 500 MG tablet  Take 1,000 mg by mouth every 6 (six) hours as needed for moderate pain.     albuterol (VENTOLIN HFA) 108 (90 Base) MCG/ACT inhaler Inhale 1-2 puffs into the lungs every 6 (six) hours as needed for wheezing or shortness of breath.     amphetamine-dextroamphetamine (ADDERALL) 30 MG tablet Take one tablet in the morning and 1/2 tablet at lunch. 45 tablet 0   amphetamine-dextroamphetamine (ADDERALL) 30 MG tablet Take one tablet in the morning and 1/2 tablet at lunch. 45 tablet 0   calcium carbonate (TUMS - DOSED IN MG ELEMENTAL CALCIUM) 500 MG chewable tablet Chew 1 tablet by mouth daily as needed for indigestion or heartburn.     clonazePAM (KLONOPIN) 1 MG tablet Take 1 tablet (1 mg total) by mouth at bedtime. (Patient not taking: Reported on 05/05/2020) 30 tablet 2   FLUoxetine (PROZAC) 40 MG capsule TAKE 1 CAPSULE BY MOUTH DAILY 30 capsule 5   fluticasone (FLONASE) 50 MCG/ACT nasal spray Place 2 sprays into both nostrils daily as needed for allergies or rhinitis.     lamoTRIgine (LAMICTAL) 150 MG tablet Take one tablet at bedtime. 30 tablet 5   lurasidone (LATUDA) 40 MG TABS tablet Take 1/2 tablet daily for 7 days, then one tablet daily. 90 tablet 0   silver sulfADIAZINE (SILVADENE) 1 % cream Apply 1 application topically daily. (Patient not taking: Reported on 05/05/2020) 100 g 0   No current facility-administered medications for this visit.    Medication Side Effects: None  Allergies:  Allergies  Allergen Reactions   Codeine Nausea And Vomiting   Other  Pt has severe nausea with anesthesia   Wound Dressing Adhesive Rash    Past Medical History:  Diagnosis Date   Anxiety    Arthritis    Bilateral knees and bilateral ankles   Asthma    Bipolar disorder (HCC)    Depression    Family history of adverse reaction to anesthesia    Post op nausea and vomiting   Family history of breast cancer    Family history of kidney cancer    Family history of prostate cancer    GERD  (gastroesophageal reflux disease)    Headache    Migraines   History of hiatal hernia    History of shoulder surgery    Hypothyroidism    Obesity    Plantar fasciitis 04/26/2013   Pneumonia    PONV (postoperative nausea and vomiting)    Post op nausea and vomiting    Past Medical History, Surgical history, Social history, and Family history were reviewed and updated as appropriate.   Please see review of systems for further details on the patient's review from today.   Objective:   Physical Exam:  There were no vitals taken for this visit.  Physical Exam Constitutional:      General: She is not in acute distress. Musculoskeletal:        General: No deformity.  Neurological:     Mental Status: She is alert and oriented to person, place, and time.     Coordination: Coordination normal.  Psychiatric:        Attention and Perception: Attention and perception normal. She does not perceive auditory or visual hallucinations.        Mood and Affect: Mood normal. Mood is not anxious or depressed. Affect is not labile, blunt, angry or inappropriate.        Speech: Speech normal.        Behavior: Behavior normal.        Thought Content: Thought content normal. Thought content is not paranoid or delusional. Thought content does not include homicidal or suicidal ideation. Thought content does not include homicidal or suicidal plan.        Cognition and Memory: Cognition and memory normal.        Judgment: Judgment normal.     Comments: Insight intact    Lab Review:     Component Value Date/Time   NA 142 10/29/2007 2043   K 4.3 10/29/2007 2043   CL 102 10/29/2007 2043   GLUCOSE 97 10/29/2007 2043   BUN 13 10/29/2007 2043   CREATININE 0.9 10/29/2007 2043       Component Value Date/Time   WBC 9.6 05/10/2020 1317   RBC 4.66 05/10/2020 1317   HGB 13.6 05/10/2020 1317   HCT 43.2 05/10/2020 1317   PLT 338 05/10/2020 1317   MCV 92.7 05/10/2020 1317   MCH 29.2 05/10/2020 1317    MCHC 31.5 05/10/2020 1317   RDW 12.6 05/10/2020 1317    No results found for: POCLITH, LITHIUM   No results found for: PHENYTOIN, PHENOBARB, VALPROATE, CBMZ   .res Assessment: Plan:    Plan:  PDMP reviewed   1. Prozac 40mg  daily 2. D/C Adderall 30mg  daily - 1 and 1/2 tabs daily 3. Lamictal 150mg  daily 4. D/C Latuda 40mg  - 1/2 tablet daily x 7 days, then one tablet daily 5. Add Vraylar 1.5mg  daily 6. Add Concerta 36mg  every morning.  Patient ADHD testing completed in office - Conner's. Meets DSM-5 criteria for Adult ADHD.  RTC 8 weeks  Patient advised to contact office with any questions, adverse effects, or acute worsening in signs and symptoms.  Discussed potential benefits, risks, and side effects of stimulants with patient to include increased heart rate, palpitations, insomnia, increased anxiety, increased irritability, or decreased appetite.  Instructed patient to contact office if experiencing any significant tolerability issues.  Counseled patient regarding potential benefits, risks, and side effects of Lamictal to include potential risk of Stevens-Johnson syndrome. Advised patient to stop taking Lamictal and contact office immediately if rash develops and to seek urgent medical attention if rash is severe and/or spreading quickly.    There are no diagnoses linked to this encounter.   Please see After Visit Summary for patient specific instructions.  Future Appointments  Date Time Provider [place]  10/20/2020 11:00 AM Buena Irish, LCSW [id number] None    No orders of the defined types were placed in this encounter.   -------------------------------

## 2020-10-20 ENCOUNTER — Ambulatory Visit (INDEPENDENT_AMBULATORY_CARE_PROVIDER_SITE_OTHER): Payer: BC Managed Care – PPO | Admitting: Psychology

## 2020-10-26 ENCOUNTER — Telehealth: Payer: Self-pay | Admitting: Adult Health

## 2020-10-26 NOTE — Telephone Encounter (Signed)
Do you know what she is referring to?

## 2020-10-26 NOTE — Telephone Encounter (Signed)
Pt LVM wanting to speak to Barnett Applebaum about a letter Barnett Applebaum was to write to send to a counselor for her.

## 2020-10-26 NOTE — Telephone Encounter (Signed)
Noted  

## 2020-10-27 NOTE — Telephone Encounter (Signed)
Spoke with patient about above note regarding letter. States that counselor is waiting on letter to proceed. Ph: [phone]

## 2020-11-03 ENCOUNTER — Other Ambulatory Visit: Payer: Self-pay | Admitting: Urology

## 2020-11-03 DIAGNOSIS — Z1589 Genetic susceptibility to other disease: Secondary | ICD-10-CM

## 2020-11-08 ENCOUNTER — Ambulatory Visit
Admission: RE | Admit: 2020-11-08 | Discharge: 2020-11-08 | Disposition: A | Discharge status: Home / Self Care | Payer: BC Managed Care – PPO | Source: Ambulatory Visit | Attending: Urology | Admitting: Urology

## 2020-11-08 ENCOUNTER — Other Ambulatory Visit: Payer: Self-pay

## 2020-11-08 DIAGNOSIS — Z1589 Genetic susceptibility to other disease: Secondary | ICD-10-CM

## 2020-11-16 ENCOUNTER — Other Ambulatory Visit: Payer: Self-pay | Admitting: Adult Health

## 2020-11-16 DIAGNOSIS — F902 Attention-deficit hyperactivity disorder, combined type: Secondary | ICD-10-CM

## 2020-11-16 MED ORDER — METHYLPHENIDATE HCL ER (OSM) 36 MG PO TBCR: 36.0000 mg | EXTENDED_RELEASE_TABLET | Freq: Every day | ORAL | 0 refills | Status: DC

## 2020-11-20 ENCOUNTER — Other Ambulatory Visit: Payer: Self-pay

## 2020-11-20 ENCOUNTER — Encounter: Payer: BC Managed Care – PPO | Attending: Surgery | Admitting: Skilled Nursing Facility1

## 2020-11-20 DIAGNOSIS — E669 Obesity, unspecified: Secondary | ICD-10-CM | POA: Insufficient documentation

## 2020-11-20 NOTE — Progress Notes (Signed)
Pre-Operative Nutrition Class:    Patient was seen on 11/20/2020 for Pre-Operative Bariatric Surgery Education at the [facility].    Surgery date:  Surgery type: sleeve Start weight at [facility]: 307 Weight today: 303.8  Samples given per MNT protocol. Patient educated on appropriate usage: Ensure max exp: March 14, 2021 Ensure max lot: [id number]  Chewable bariatric advantage: advanced multi EA exp: 08/23 Chewable bariatric advantage: advanced multi EA lot: [id number]  Bariatric advantage calcium citrate exp: 02/23 Bariatric advantage calcium citrate lot: [id number]   The following the learning objectives were met by the patient during this course: Identify Pre-Op Dietary Goals and will begin 2 weeks pre-operatively Identify appropriate sources of fluids and proteins  State protein recommendations and appropriate sources pre and post-operatively Identify Post-Operative Dietary Goals and will follow for 2 weeks post-operatively Identify appropriate multivitamin and calcium sources Describe the need for physical activity post-operatively and will follow MD recommendations State when to call healthcare provider regarding medication questions or post-operative complications When having a diagnosis of diabetes understanding hypoglycemia symptoms and the inclusion of 1 complex carbohydrate per meal  Handouts given during class include: Pre-Op Bariatric Surgery Diet Handout Protein Shake Handout Post-Op Bariatric Surgery Nutrition Handout BELT Program Information Flyer Support Group Information Flyer [facility] Bariatric Supplements Price List  Follow-Up Plan: Patient will follow-up at [facility] 2 weeks post operatively for diet advancement per MD.

## 2020-11-21 ENCOUNTER — Ambulatory Visit: Payer: Self-pay | Admitting: Surgery

## 2020-11-21 NOTE — H&P (View-Only) (Signed)
Surgical Evaluation   Chief Complaint: morbid obesity  HPI: Following up for further consultation regarding surgical management of morbid obesity.  She denies any changes in her health in the last 8 months since our initial visit.  She has completed the bariatric pathway with no barriers identified.  She has a few questions to discuss today, but otherwise looking forward to surgery on the 28th of this month.    Nutrition- approved Sandie Ano, completed 71mos SWL) Psychology- approved Buena Irish) Labs- April 2022- TSH was mildly elevated, TSH was mildly elevated which she followed up with her primary care doctor. CXR- negative UGI- 1. Normal esophageal motility. 2. Small to moderate-sized hiatal hernia and widely patent lower esophageal mucosal B ring. 3. Moderate inducible GE reflux with water swallowing. 4. The stomach, duodenal bulb and C-loop are normal.  Initial visit 04/07/20: 45 year old woman with history of hypothyroidism, depression/anxiety, asthma, presents for surgical consultation regarding management of severe obesity. She has been struggling with this for much of her delightful.  She was overweight in her 33s, and with each successive pregnancy had progressive weight gain.  After her last child was born, she had to have a hysterectomy for pelvic prolapse and has had worse symptoms since that time.  She has tried innumerable diet, exercise plans, phentermine, behavioral modification etc. with limited in transient success.  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 She is fairly sedentary.  Endorses a fairly well-balanced eating regimen. She is interested in the sleeve gastrectomy in order to afford her significant weight loss so that she can improve her functional  status, quality of life, and avoid developing, medications of obesity. She is also undergoing a workup for breast lesions and is to have a excisional biopsy with Dr. Donne Hazel in the near future.  She does have a family history of breast cancer.   Previous abdominal surgery includes hysterectomy, open appendectomy, cholecystectomy.  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  300lb/ BMI 53  Allergies  Allergen Reactions   Codeine Nausea And Vomiting   Other     Pt has severe nausea with anesthesia   Wound Dressing Adhesive Rash    Past Medical History:  Diagnosis Date   Anxiety    Arthritis    Bilateral knees and bilateral ankles   Asthma    Bipolar disorder (HCC)    Depression    Family history of adverse reaction to anesthesia    Post op nausea and vomiting   Family history of breast cancer    Family history of kidney cancer    Family history of prostate cancer    GERD (gastroesophageal reflux disease)    Headache    Migraines   History of hiatal hernia    History of shoulder surgery    Hypothyroidism    Obesity    Plantar fasciitis 04/26/2013   Pneumonia    PONV (postoperative nausea and vomiting)    Post op nausea and vomiting    Past Surgical History:  Procedure Laterality Date   ABDOMINAL HYSTERECTOMY     Bladder repair during hysterectomy   APPENDECTOMY     BREAST DUCTAL SYSTEM EXCISION Left 05/18/2020   Procedure: EXCISION  DUCTAL SYSTEM LEFT BREAST;  Surgeon: Rolm Bookbinder, MD;  Location: [facility];  Service: General;  Laterality: Left;   CHOLECYSTECTOMY     DILATION AND CURETTAGE OF UTERUS     FRACTURE SURGERY     LEG SURGERY Right    RADIOACTIVE SEED GUIDED EXCISIONAL BREAST BIOPSY Left 05/18/2020   Procedure: RADIOACTIVE SEED GUIDED EXCISIONAL BREAST MASS;  Surgeon: Rolm Bookbinder, MD;  Location: [facility];  Service: General;  Laterality: Left;   SHOULDER SURGERY  Right     Family History  Problem Relation Age of Onset   Cervical cancer Mother    Diabetes Father    Hypertension Father    Kidney cancer Maternal Aunt        dx in her 10s   Cervical cancer Maternal Aunt    Arthritis Maternal Grandmother    Breast cancer Maternal Grandmother        dx in her 19s   Prostate cancer Maternal Grandfather    Anxiety disorder Son    Asperger's syndrome Son    OCD Son    ADD / ADHD Son    Depression Son    Breast cancer Paternal Aunt        dx > 49   Lung cancer Paternal Grandmother    Lung cancer Paternal Grandfather    Breast cancer Other        PGMs sister d. 51   Breast cancer Other        PGMs sister d. 16    Social History   Socioeconomic History   Marital status: Married    Spouse name: Not on file   Number of children: Not on file   Years of education: Not on file   Highest education level: Not on file  Occupational History   Not on file  Tobacco Use   Smoking status: Never   Smokeless tobacco: Never  Vaping Use   Vaping Use: Never used  Substance and Sexual Activity   Alcohol use: Not Currently   Drug use: No   Sexual activity: Yes    Birth control/protection: Surgical    Comment: hyst  Other Topics Concern   Not on file  Social History Narrative   Not on file   Social Determinants of Health   Financial Resource Strain: Not on file  Food Insecurity: Not on file  Transportation Needs: Not on file  Physical Activity: Not on file  Stress: Not on file  Social Connections: Not on file    Current Outpatient Medications on File Prior to Visit  Medication Sig Dispense Refill   acetaminophen (TYLENOL) 500 MG tablet Take 1,000 mg by mouth every 6 (six) hours as needed for moderate pain.     albuterol (VENTOLIN HFA) 108 (90 Base) MCG/ACT inhaler Inhale 1-2 puffs into the lungs every 6 (six) hours as needed for wheezing or shortness of breath.     amphetamine-dextroamphetamine (ADDERALL) 30 MG tablet Take one tablet in  the morning and 1/2 tablet at lunch. 45 tablet 0   amphetamine-dextroamphetamine (ADDERALL) 30 MG tablet Take one tablet in the morning and 1/2 tablet at lunch. 45 tablet 0   calcium carbonate (TUMS - DOSED IN MG ELEMENTAL CALCIUM) 500 MG chewable tablet Chew 1 tablet by mouth daily as needed for indigestion or heartburn.     clonazePAM (KLONOPIN) 1 MG tablet Take 1 tablet (1 mg total) by mouth at bedtime. (Patient not taking: Reported on 05/05/2020) 30 tablet 2   FLUoxetine (PROZAC) 40 MG  capsule TAKE 1 CAPSULE BY MOUTH DAILY 30 capsule 5   fluticasone (FLONASE) 50 MCG/ACT nasal spray Place 2 sprays into both nostrils daily as needed for allergies or rhinitis.     lamoTRIgine (LAMICTAL) 150 MG tablet Take one tablet at bedtime. 30 tablet 5   lurasidone (LATUDA) 40 MG TABS tablet Take 1/2 tablet daily for 7 days, then one tablet daily. 90 tablet 0   methylphenidate (CONCERTA) 36 MG PO CR tablet Take 1 tablet (36 mg total) by mouth daily. 30 tablet 0   silver sulfADIAZINE (SILVADENE) 1 % cream Apply 1 application topically daily. (Patient not taking: Reported on 05/05/2020) 100 g 0   No current facility-administered medications on file prior to visit.    Review of Systems: a complete, 10pt review of systems was completed with pertinent positives and negatives as documented in the HPI  Physical Exam: Heart rate 101, blood pressure 130/82, sat 99%, temperature 36.4 Weight 138 kg / 304 pounds 3 ounces/BMI 53.9   A&Ox3  Unlabored respirations   CBC Latest Ref Rng & Units 05/10/2020 10/29/2007  WBC 4.0 - 10.5 K/uL 9.6 -  Hemoglobin 12.0 - 15.0 g/dL 13.6 14.3  Hematocrit 36.0 - 46.0 % 43.2 42.0  Platelets 150 - 400 K/uL 338 -    CMP 10/29/2007  Glucose 97  BUN 13  Creatinine 0.9  Sodium 142  Potassium 4.3  Chloride 102    No results found for: INR, PROTIME  Imaging: No results found.   A/P:   MORBID OBESITY (E66.01) Story: She remains a good candidate for sleeve gastrectomy.  We have previously discussed the surgery including technical aspects, the risks of bleeding, infection, pain, scarring, injury to intra-abdominal structures, staple line leak or abscess, chronic abdominal pain or nausea, new onset or worsened GERD, DVT/PE, pneumonia, heart attack, stroke, death, failure to reach weight loss goals and weight regain, hernia. Discussed the typical pre-, peri-, and postoperative course. Discussed the importance of lifelong behavioral changes to combat the chronic and relapsing disease which is obesity. Discussed the surgery needs to be one arm of a multi-prong treatment approach. Questions were welcomed and answered, will plan to proceed as scheduled.  ANXIETY AND DEPRESSION (F41.9) HYPOTHYROID (E03.9) ASTHMA (J45.909) PCOS (POLYCYSTIC OVARIAN SYNDROME) (E28.2)  Patient Active Problem List   Diagnosis Date Noted   Genetic testing 05/22/2020   Family history of prostate cancer    Family history of kidney cancer    Family history of breast cancer 09/02/2016   Depression with anxiety, panic attacks 02/18/2014   Panic attacks 02/18/2014   Hypothyroid 04/26/2013   Plantar fasciitis 04/26/2013   Left breast mass 04/06/2013       Romana Juniper, MD [facility]  See AMION to contact appropriate on-call provider

## 2020-11-21 NOTE — Progress Notes (Signed)
Surgery orders requested with Dr. Kae Heller.

## 2020-11-21 NOTE — H&P (Signed)
Surgical Evaluation   Chief Complaint: morbid obesity  HPI: Following up for further consultation regarding surgical management of morbid obesity.  She denies any changes in her health in the last 8 months since our initial visit.  She has completed the bariatric pathway with no barriers identified.  She has a few questions to discuss today, but otherwise looking forward to surgery on the 28th of this month.    Nutrition- approved Sandie Ano, completed 82mos SWL) Psychology- approved Buena Irish) Labs- April 2022- TSH was mildly elevated, TSH was mildly elevated which she followed up with her primary care doctor. CXR- negative UGI- 1. Normal esophageal motility. 2. Small to moderate-sized hiatal hernia and widely patent lower esophageal mucosal B ring. 3. Moderate inducible GE reflux with water swallowing. 4. The stomach, duodenal bulb and C-loop are normal.  Initial visit 04/07/20: 45 year old woman with history of hypothyroidism, depression/anxiety, asthma, presents for surgical consultation regarding management of severe obesity. She has been struggling with this for much of her delightful.  She was overweight in her 72s, and with each successive pregnancy had progressive weight gain.  After her last child was born, she had to have a hysterectomy for pelvic prolapse and has had worse symptoms since that time.  She has tried innumerable diet, exercise plans, phentermine, behavioral modification etc. with limited in transient success.  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 She is fairly sedentary.  Endorses a fairly well-balanced eating regimen. She is interested in the sleeve gastrectomy in order to afford her significant weight loss so that she can improve her functional  status, quality of life, and avoid developing, medications of obesity. She is also undergoing a workup for breast lesions and is to have a excisional biopsy with Dr. Donne Hazel in the near future.  She does have a family history of breast cancer.   Previous abdominal surgery includes hysterectomy, open appendectomy, cholecystectomy.  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  300lb/ BMI 53  Allergies  Allergen Reactions   Codeine Nausea And Vomiting   Other     Pt has severe nausea with anesthesia   Wound Dressing Adhesive Rash    Past Medical History:  Diagnosis Date   Anxiety    Arthritis    Bilateral knees and bilateral ankles   Asthma    Bipolar disorder (HCC)    Depression    Family history of adverse reaction to anesthesia    Post op nausea and vomiting   Family history of breast cancer    Family history of kidney cancer    Family history of prostate cancer    GERD (gastroesophageal reflux disease)    Headache    Migraines   History of hiatal hernia    History of shoulder surgery    Hypothyroidism    Obesity    Plantar fasciitis 04/26/2013   Pneumonia    PONV (postoperative nausea and vomiting)    Post op nausea and vomiting    Past Surgical History:  Procedure Laterality Date   ABDOMINAL HYSTERECTOMY     Bladder repair during hysterectomy   APPENDECTOMY     BREAST DUCTAL SYSTEM EXCISION Left 05/18/2020   Procedure: EXCISION  DUCTAL SYSTEM LEFT BREAST;  Surgeon: Rolm Bookbinder, MD;  Location: [place];  Service: General;  Laterality: Left;   CHOLECYSTECTOMY     DILATION AND CURETTAGE OF UTERUS     FRACTURE SURGERY     LEG SURGERY Right    RADIOACTIVE SEED GUIDED EXCISIONAL BREAST BIOPSY Left 05/18/2020   Procedure: RADIOACTIVE SEED GUIDED EXCISIONAL BREAST MASS;  Surgeon: Rolm Bookbinder, MD;  Location: [place];  Service: General;  Laterality: Left;   SHOULDER SURGERY  Right     Family History  Problem Relation Age of Onset   Cervical cancer Mother    Diabetes Father    Hypertension Father    Kidney cancer Maternal Aunt        dx in her 73s   Cervical cancer Maternal Aunt    Arthritis Maternal Grandmother    Breast cancer Maternal Grandmother        dx in her 15s   Prostate cancer Maternal Grandfather    Anxiety disorder Son    Asperger's syndrome Son    OCD Son    ADD / ADHD Son    Depression Son    Breast cancer Paternal Aunt        dx > 59   Lung cancer Paternal Grandmother    Lung cancer Paternal Grandfather    Breast cancer Other        PGMs sister d. 92   Breast cancer Other        PGMs sister d. 67    Social History   Socioeconomic History   Marital status: Married    Spouse name: Not on file   Number of children: Not on file   Years of education: Not on file   Highest education level: Not on file  Occupational History   Not on file  Tobacco Use   Smoking status: Never   Smokeless tobacco: Never  Vaping Use   Vaping Use: Never used  Substance and Sexual Activity   Alcohol use: Not Currently   Drug use: No   Sexual activity: Yes    Birth control/protection: Surgical    Comment: hyst  Other Topics Concern   Not on file  Social History Narrative   Not on file   Social Determinants of Health   Financial Resource Strain: Not on file  Food Insecurity: Not on file  Transportation Needs: Not on file  Physical Activity: Not on file  Stress: Not on file  Social Connections: Not on file    Current Outpatient Medications on File Prior to Visit  Medication Sig Dispense Refill   acetaminophen (TYLENOL) 500 MG tablet Take 1,000 mg by mouth every 6 (six) hours as needed for moderate pain.     albuterol (VENTOLIN HFA) 108 (90 Base) MCG/ACT inhaler Inhale 1-2 puffs into the lungs every 6 (six) hours as needed for wheezing or shortness of breath.     amphetamine-dextroamphetamine (ADDERALL) 30 MG tablet Take one tablet in  the morning and 1/2 tablet at lunch. 45 tablet 0   amphetamine-dextroamphetamine (ADDERALL) 30 MG tablet Take one tablet in the morning and 1/2 tablet at lunch. 45 tablet 0   calcium carbonate (TUMS - DOSED IN MG ELEMENTAL CALCIUM) 500 MG chewable tablet Chew 1 tablet by mouth daily as needed for indigestion or heartburn.     clonazePAM (KLONOPIN) 1 MG tablet Take 1 tablet (1 mg total) by mouth at bedtime. (Patient not taking: Reported on 05/05/2020) 30 tablet 2   FLUoxetine (PROZAC) 40 MG  capsule TAKE 1 CAPSULE BY MOUTH DAILY 30 capsule 5   fluticasone (FLONASE) 50 MCG/ACT nasal spray Place 2 sprays into both nostrils daily as needed for allergies or rhinitis.     lamoTRIgine (LAMICTAL) 150 MG tablet Take one tablet at bedtime. 30 tablet 5   lurasidone (LATUDA) 40 MG TABS tablet Take 1/2 tablet daily for 7 days, then one tablet daily. 90 tablet 0   methylphenidate (CONCERTA) 36 MG PO CR tablet Take 1 tablet (36 mg total) by mouth daily. 30 tablet 0   silver sulfADIAZINE (SILVADENE) 1 % cream Apply 1 application topically daily. (Patient not taking: Reported on 05/05/2020) 100 g 0   No current facility-administered medications on file prior to visit.    Review of Systems: a complete, 10pt review of systems was completed with pertinent positives and negatives as documented in the HPI  Physical Exam: Heart rate 101, blood pressure 130/82, sat 99%, temperature 36.4 Weight 138 kg / 304 pounds 3 ounces/BMI 53.9   A&Ox3  Unlabored respirations   CBC Latest Ref Rng & Units 05/10/2020 10/29/2007  WBC 4.0 - 10.5 K/uL 9.6 -  Hemoglobin 12.0 - 15.0 g/dL 13.6 14.3  Hematocrit 36.0 - 46.0 % 43.2 42.0  Platelets 150 - 400 K/uL 338 -    CMP 10/29/2007  Glucose 97  BUN 13  Creatinine 0.9  Sodium 142  Potassium 4.3  Chloride 102    No results found for: INR, PROTIME  Imaging: No results found.   A/P:   MORBID OBESITY (E66.01) Story: She remains a good candidate for sleeve gastrectomy.  We have previously discussed the surgery including technical aspects, the risks of bleeding, infection, pain, scarring, injury to intra-abdominal structures, staple line leak or abscess, chronic abdominal pain or nausea, new onset or worsened GERD, DVT/PE, pneumonia, heart attack, stroke, death, failure to reach weight loss goals and weight regain, hernia. Discussed the typical pre-, peri-, and postoperative course. Discussed the importance of lifelong behavioral changes to combat the chronic and relapsing disease which is obesity. Discussed the surgery needs to be one arm of a multi-prong treatment approach. Questions were welcomed and answered, will plan to proceed as scheduled.  ANXIETY AND DEPRESSION (F41.9) HYPOTHYROID (E03.9) ASTHMA (J45.909) PCOS (POLYCYSTIC OVARIAN SYNDROME) (E28.2)  Patient Active Problem List   Diagnosis Date Noted   Genetic testing 05/22/2020   Family history of prostate cancer    Family history of kidney cancer    Family history of breast cancer 09/02/2016   Depression with anxiety, panic attacks 02/18/2014   Panic attacks 02/18/2014   Hypothyroid 04/26/2013   Plantar fasciitis 04/26/2013   Left breast mass 04/06/2013       Romana Juniper, MD [facility]  See AMION to contact appropriate on-call provider

## 2020-11-29 ENCOUNTER — Telehealth (INDEPENDENT_AMBULATORY_CARE_PROVIDER_SITE_OTHER): Payer: BC Managed Care – PPO | Admitting: Adult Health

## 2020-11-29 ENCOUNTER — Encounter: Payer: Self-pay | Admitting: Adult Health

## 2020-11-29 ENCOUNTER — Other Ambulatory Visit (HOSPITAL_COMMUNITY): Payer: Self-pay

## 2020-11-29 DIAGNOSIS — F411 Generalized anxiety disorder: Secondary | ICD-10-CM

## 2020-11-29 DIAGNOSIS — G4700 Insomnia, unspecified: Secondary | ICD-10-CM | POA: Diagnosis not present

## 2020-11-29 DIAGNOSIS — F331 Major depressive disorder, recurrent, moderate: Secondary | ICD-10-CM | POA: Diagnosis not present

## 2020-11-29 DIAGNOSIS — F902 Attention-deficit hyperactivity disorder, combined type: Secondary | ICD-10-CM

## 2020-11-29 NOTE — Progress Notes (Signed)
[name] [id number] 1975/04/27 45 y.o.  Virtual Visit via Video Note  I connected with pt @ on 11/30/20 at  5:00 PM EST by a video enabled telemedicine application and verified that I am speaking with the correct person using two identifiers.   I discussed the limitations of evaluation and management by telemedicine and the availability of in person appointments. The patient expressed understanding and agreed to proceed.  I discussed the assessment and treatment plan with the patient. The patient was provided an opportunity to ask questions and all were answered. The patient agreed with the plan and demonstrated an understanding of the instructions.   The patient was advised to call back or seek an in-person evaluation if the symptoms worsen or if the condition fails to improve as anticipated.  I provided 25 minutes of non-face-to-face time during this encounter.  The patient was located at home.  The provider was located at [place].   Aloha Gell, NP   Subjective:   Patient ID:  [name] is a 45 y.o. (DOB 1975-02-29) female.  Chief Complaint: No chief complaint on file.   HPI [facility] presents for follow-up of ADHD, GAD, MDD, insomnia.   Describes mood today as "ok". Pleasant. Mood symptoms - denies depression. Reports some anxiety. Denies irritability. Denies recent panic attacks. Stating "I'm doing alright". Feels like medications are helpful. Is scheduled for bariatric surgery on 11-30. She and family doing well. Taking medications as prescribed.  Energy levels low. Active, does not have a regular exercise routine.  Enjoys some usual interests and activities. Married. Lives husband of 34 years and 3 sons. Spending time with family. Appetite varies. Weight stable - 300 pounds.  Sleep is about the same. Averages 4 to 5 hours.  Focus and concentration stable with Adderall. Completing tasks. Managing aspects of household. Work as a Geophysicist/field seismologist.  Denies  SI or HI.  Denies AH or VH.  Previous medications: Seroquel - tremors, Wellbutrin   Review of Systems:  Review of Systems  Musculoskeletal:  Negative for gait problem.  Neurological:  Negative for tremors.  Psychiatric/Behavioral:         Please refer to HPI   Medications: I have reviewed the patient's current medications.  Current Outpatient Medications  Medication Sig Dispense Refill   acetaminophen (TYLENOL) 500 MG tablet Take 1,000 mg by mouth every 6 (six) hours as needed for moderate pain.     albuterol (VENTOLIN HFA) 108 (90 Base) MCG/ACT inhaler Inhale 1-2 puffs into the lungs every 6 (six) hours as needed for wheezing or shortness of breath.     amphetamine-dextroamphetamine (ADDERALL) 30 MG tablet Take one tablet in the morning and 1/2 tablet at lunch. (Patient taking differently: Take 15-30 mg by mouth See admin instructions. Take 30 mg by mouth in the morning and 15 mg at lunch if needed while working.) 45 tablet 0   amphetamine-dextroamphetamine (ADDERALL) 30 MG tablet Take one tablet in the morning and 1/2 tablet at lunch. (Patient not taking: No sig reported) 45 tablet 0   calcium carbonate (TUMS - DOSED IN MG ELEMENTAL CALCIUM) 500 MG chewable tablet Chew 1 tablet by mouth daily as needed for indigestion or heartburn. (Patient not taking: Reported on 11/28/2020)     clonazePAM (KLONOPIN) 1 MG tablet Take 1 tablet (1 mg total) by mouth at bedtime. (Patient not taking: Reported on 05/05/2020) 30 tablet 2   FLUoxetine (PROZAC) 40 MG capsule TAKE 1 CAPSULE BY MOUTH DAILY 30 capsule 5  lamoTRIgine (LAMICTAL) 150 MG tablet Take one tablet at bedtime. (Patient taking differently: Take 150 mg by mouth in the morning.) 30 tablet 5   lurasidone (LATUDA) 40 MG TABS tablet Take 1/2 tablet daily for 7 days, then one tablet daily. (Patient not taking: Reported on 11/28/2020) 90 tablet 0   methylphenidate (CONCERTA) 36 MG PO CR tablet Take 1 tablet (36 mg total) by mouth daily. 30 tablet 0    Polyethyl Glycol-Propyl Glycol (SYSTANE HYDRATION PF OP) Place 1 drop into both eyes 2 (two) times daily as needed (dry eyes).     silver sulfADIAZINE (SILVADENE) 1 % cream Apply 1 application topically daily. (Patient taking differently: Apply 1 application topically daily as needed (open wounds).) 100 g 0   No current facility-administered medications for this visit.    Medication Side Effects: None  Allergies:  Allergies  Allergen Reactions   Codeine Nausea And Vomiting   Other Nausea Only    Pt has severe nausea with anesthesia   Wound Dressing Adhesive Rash    Past Medical History:  Diagnosis Date   Anxiety    Arthritis    Bilateral knees and bilateral ankles   Asthma    Bipolar disorder (HCC)    Depression    Family history of adverse reaction to anesthesia    Post op nausea and vomiting   Family history of breast cancer    Family history of kidney cancer    Family history of prostate cancer    GERD (gastroesophageal reflux disease)    Headache    Migraines   History of hiatal hernia    History of shoulder surgery    Hypothyroidism    Obesity    Plantar fasciitis 04/26/2013   Pneumonia    PONV (postoperative nausea and vomiting)    Post op nausea and vomiting    Family History  Problem Relation Age of Onset   Cervical cancer Mother    Diabetes Father    Hypertension Father    Kidney cancer Maternal Aunt        dx in her 21s   Cervical cancer Maternal Aunt    Arthritis Maternal Grandmother    Breast cancer Maternal Grandmother        dx in her 62s   Prostate cancer Maternal Grandfather    Anxiety disorder Son    Asperger's syndrome Son    OCD Son    ADD / ADHD Son    Depression Son    Breast cancer Paternal Aunt        dx > 35   Lung cancer Paternal Grandmother    Lung cancer Paternal Grandfather    Breast cancer Other        PGMs sister d. 20   Breast cancer Other        PGMs sister d. 57    Social History   Socioeconomic History    Marital status: Married    Spouse name: Not on file   Number of children: Not on file   Years of education: Not on file   Highest education level: Not on file  Occupational History   Not on file  Tobacco Use   Smoking status: Never   Smokeless tobacco: Never  Vaping Use   Vaping Use: Never used  Substance and Sexual Activity   Alcohol use: Not Currently   Drug use: No   Sexual activity: Yes    Birth control/protection: Surgical    Comment: hyst  Other Topics Concern  Not on file  Social History Narrative   Not on file   Social Determinants of Health   Financial Resource Strain: Not on file  Food Insecurity: Not on file  Transportation Needs: Not on file  Physical Activity: Not on file  Stress: Not on file  Social Connections: Not on file  Intimate Partner Violence: Not on file    Past Medical History, Surgical history, Social history, and Family history were reviewed and updated as appropriate.   Please see review of systems for further details on the patient's review from today.   Objective:   Physical Exam:  There were no vitals taken for this visit.  Physical Exam Constitutional:      General: She is not in acute distress. Musculoskeletal:        General: No deformity.  Neurological:     Mental Status: She is alert and oriented to person, place, and time.     Coordination: Coordination normal.  Psychiatric:        Attention and Perception: Attention and perception normal. She does not perceive auditory or visual hallucinations.        Mood and Affect: Mood normal. Mood is not anxious or depressed. Affect is not labile, blunt, angry or inappropriate.        Speech: Speech normal.        Behavior: Behavior normal.        Thought Content: Thought content normal. Thought content is not paranoid or delusional. Thought content does not include homicidal or suicidal ideation. Thought content does not include homicidal or suicidal plan.        Cognition and  Memory: Cognition and memory normal.        Judgment: Judgment normal.     Comments: Insight intact    Lab Review:     Component Value Date/Time   NA 142 10/29/2007 2043   K 4.3 10/29/2007 2043   CL 102 10/29/2007 2043   GLUCOSE 97 10/29/2007 2043   BUN 13 10/29/2007 2043   CREATININE 0.9 10/29/2007 2043       Component Value Date/Time   WBC 9.6 05/10/2020 1317   RBC 4.66 05/10/2020 1317   HGB 13.6 05/10/2020 1317   HCT 43.2 05/10/2020 1317   PLT 338 05/10/2020 1317   MCV 92.7 05/10/2020 1317   MCH 29.2 05/10/2020 1317   MCHC 31.5 05/10/2020 1317   RDW 12.6 05/10/2020 1317    No results found for: POCLITH, LITHIUM   No results found for: PHENYTOIN, PHENOBARB, VALPROATE, CBMZ   .res Assessment: Plan:     Plan:  PDMP reviewed   Prozac 40mg  daily Lamictal 150mg  daily   Vraylar 1.5mg  daily - has not started taking yet Concerta 36mg  every morning - has not started yet  Patient ADHD testing completed in office - Conner's. Meets DSM-5 criteria for Adult ADHD.   RTC 8 weeks  Patient advised to contact office with any questions, adverse effects, or acute worsening in signs and symptoms.  Discussed potential benefits, risks, and side effects of stimulants with patient to include increased heart rate, palpitations, insomnia, increased anxiety, increased irritability, or decreased appetite.  Instructed patient to contact office if experiencing any significant tolerability issues.  Counseled patient regarding potential benefits, risks, and side effects of Lamictal to include potential risk of Stevens-Johnson syndrome. Advised patient to stop taking Lamictal and contact office immediately if rash develops and to seek urgent medical attention if rash is severe and/or spreading quickly.   Diagnoses and  all orders for this visit:  Attention deficit hyperactivity disorder (ADHD), combined type  Major depressive disorder, recurrent episode, moderate (HCC)  Generalized  anxiety disorder  Insomnia, unspecified type    Please see After Visit Summary for patient specific instructions.  Future Appointments  Date Time Provider [place]  12/04/2020 10:00 AM [facility] PAT 5 [facility] None  12/26/2020  3:15 PM [facility] POST-OP CLASS [facility]    No orders of the defined types were placed in this encounter.     -------------------------------

## 2020-11-30 ENCOUNTER — Encounter (HOSPITAL_COMMUNITY): Payer: Self-pay

## 2020-11-30 NOTE — Progress Notes (Addendum)
PCP - Wende Neighbors, MD Cardiologist - Rozann Lesches   saw in the past for echo no problems found no longer see's him   PPM/ICD -  Device Orders -  Rep Notified -   Chest x-ray - 04-27-20 epic EKG - 04-27-20  Stress Test -  ECHO - 2020 Cardiac Cath -   Sleep Study -  CPAP -   Fasting Blood Sugar -  Checks Blood Sugar _____ times a day  Blood Thinner Instructions: Aspirin Instructions:  ERAS Protcol - PRE-SURGERY G2-   COVID TEST- SAME DAY COVID vaccine -moderna x1 dose  Activity--Able to walk a flight of stairs without SOB Anesthesia review: Asthma  Patient denies shortness of breath, fever, cough and chest pain at PAT appointment   All instructions explained to the patient, with a verbal understanding of the material. Patient agrees to go over the instructions while at home for a better understanding. Patient also instructed to self quarantine after being tested for COVID-19. The opportunity to ask questions was provided.

## 2020-11-30 NOTE — Patient Instructions (Addendum)
DUE TO COVID-19 ONLY ONE VISITOR IS ALLOWED TO COME WITH YOU AND STAY IN THE WAITING ROOM ONLY DURING PRE OP AND PROCEDURE DAY OF SURGERY.   Up to two visitors ages 16+ are allowed at one time in a patient's room.  The visitors may rotate out with other people throughout the day.  Additionally, up to two children between the ages of 64 and 72 are allowed and do not count toward the number of allowed visitors.  Children within this age range must be accompanied by an adult visitor.  One adult visitor may remain with the patient overnight and must be in the room by 8 PM.  YOU NEED TO HAVE A COVID 19 TEST ON_______ the morning of your surgery            Your procedure is scheduled on: 12-11-20   Report to [facility] Main  Entrance   Report to admitting at      1130 AM     Call this number if you have problems the morning of surgery [phone]   Remember:MORNING OF SURGERY DRINK:   DRINK 1 G2 drink BEFORE YOU LEAVE HOME, DRINK ALL OF THE  G2 DRINK AT ONE TIME.    NO SOLID FOOD AFTER 600 PM THE NIGHT BEFORE YOUR SURGERY.   YOU MAY DRINK CLEAR FLUIDS. THE G2 DRINK YOU DRINK BEFORE YOU LEAVE HOME WILL BE THE LAST FLUIDS YOU DRINK BEFORE SURGERY.  PAIN IS EXPECTED AFTER SURGERY AND WILL NOT BE COMPLETELY ELIMINATED. AMBULATION AND TYLENOL WILL HELP REDUCE INCISIONAL AND GAS PAIN. MOVEMENT IS KEY!  YOU ARE EXPECTED TO BE OUT OF BED WITHIN 4 HOURS OF ADMISSION TO YOUR PATIENT ROOM.  SITTING IN THE RECLINER THROUGHOUT THE DAY IS IMPORTANT FOR DRINKING FLUIDS AND MOVING GAS THROUGHOUT THE GI TRACT.  COMPRESSION STOCKINGS SHOULD BE WORN [place] UNLESS YOU ARE WALKING.   INCENTIVE SPIROMETER SHOULD BE USED EVERY HOUR WHILE AWAKE TO DECREASE POST-OPERATIVE COMPLICATIONS SUCH AS PNEUMONIA.  WHEN DISCHARGED HOME, IT IS IMPORTANT TO CONTINUE TO WALK EVERY HOUR AND USE THE INCENTIVE SPIROMETER EVERY HOUR.      CLEAR LIQUID DIET                                                                     water Black Coffee and tea, regular and decaf No Creamer                            Plain Jell-O any favor except red or purple                                  Fruit ices (not with fruit pulp)                                      Iced Popsicles  Cranberry, grape and apple juices Sports drinks like Gatorade Lightly seasoned clear broth or consume(fat free) Sugar, honey syrup  Sample Menu Breakfast                                Lunch                                     Supper Cranberry juice                    Beef broth                            Chicken broth Jell-O                                     Grape juice                           Apple juice Coffee or tea                        Jell-O                                      Popsicle                                                Coffee or tea                        Coffee or tea  _____________________________________________________________________           BRUSH YOUR TEETH MORNING OF SURGERY AND RINSE YOUR MOUTH OUT, NO CHEWING GUM CANDY OR MINTS.     Take these medicines the morning of surgery with A SIP OF WATER: Lamotrigine, fluoxentine, inhaler(bring with you)  DO NOT TAKE ANY DIABETIC MEDICATIONS DAY OF YOUR SURGERY                               You may not have any metal on your body including hair pins and              piercings  Do not wear jewelry, make-up, lotions, powders,perfumes,        deodorant             Do not wear nail polish on your fingernails or toenails .  Do not shave  48 hours prior to surgery.             Do not bring valuables to the hospital. [place].  Contacts, dentures or bridgework may not be worn into surgery.  You may bring a small overnight bag with you     Patients discharged the day of surgery will not be allowed to drive home. IF  YOU  ARE HAVING SURGERY AND GOING HOME THE SAME DAY, YOU MUST HAVE AN ADULT TO DRIVE YOU HOME AND BE WITH YOU FOR 24 HOURS. YOU MAY GO HOME BY TAXI OR UBER OR ORTHERWISE, BUT AN ADULT MUST ACCOMPANY YOU HOME AND STAY WITH YOU FOR 24 HOURS.  Name and phone number of your driver:  Special Instructions: N/A              Please read over the following fact sheets you were given: _____________________________________________________________________             [facility] - Preparing for Surgery Before surgery, you can play an important role.  Because skin is not sterile, your skin needs to be as free of germs as possible.  You can reduce the number of germs on your skin by washing with CHG (chlorahexidine gluconate) soap before surgery.  CHG is an antiseptic cleaner which kills germs and bonds with the skin to continue killing germs even after washing. Please DO NOT use if you have an allergy to CHG or antibacterial soaps.  If your skin becomes reddened/irritated stop using the CHG and inform your nurse when you arrive at Short Stay. Do not shave (including legs and underarms) for at least 48 hours prior to the first CHG shower.  You may shave your face/neck. Please follow these instructions carefully:  1.  Shower with CHG Soap the night before surgery and the  morning of Surgery.  2.  If you choose to wash your hair, wash your hair first as usual with your  normal  shampoo.  3.  After you shampoo, rinse your hair and body thoroughly to remove the  shampoo.                           4.  Use CHG as you would any other liquid soap.  You can apply chg directly  to the skin and wash                       Gently with a scrungie or clean washcloth.  5.  Apply the CHG Soap to your body ONLY FROM THE NECK DOWN.   Do not use on face/ open                           Wound or open sores. Avoid contact with eyes, ears mouth and genitals (private parts).                       Wash face,  Genitals (private parts)  with your normal soap.             6.  Wash thoroughly, paying special attention to the area where your surgery  will be performed.  7.  Thoroughly rinse your body with warm water from the neck down.  8.  DO NOT shower/wash with your normal soap after using and rinsing off  the CHG Soap.                9.  Pat yourself dry with a clean towel.            10.  Wear clean pajamas.            11.  Place clean sheets on your bed the night of your first shower and do not  sleep with pets. Day of Surgery :  Do not apply any lotions/deodorants the morning of surgery.  Please wear clean clothes to the hospital/surgery center.  FAILURE TO FOLLOW THESE INSTRUCTIONS MAY RESULT IN THE CANCELLATION OF YOUR SURGERY PATIENT SIGNATURE_________________________________  NURSE SIGNATURE__________________________________  ________________________________________________________________________    Adam Phenix  An incentive spirometer is a tool that can help keep your lungs clear and active. This tool measures how well you are filling your lungs with each breath. Taking long deep breaths may help reverse or decrease the chance of developing breathing (pulmonary) problems (especially infection) following: A long period of time when you are unable to move or be active. BEFORE THE PROCEDURE  If the spirometer includes an indicator to show your best effort, your nurse or respiratory therapist will set it to a desired goal. If possible, sit up straight or lean slightly forward. Try not to slouch. Hold the incentive spirometer in an upright position. INSTRUCTIONS FOR USE  Sit on the edge of your bed if possible, or sit up as far as you can in bed or on a chair. Hold the incentive spirometer in an upright position. Breathe out normally. Place the mouthpiece in your mouth and seal your lips tightly around it. Breathe in slowly and as deeply as possible, raising the piston or the ball toward the top of the  column. Hold your breath for 3-5 seconds or for as long as possible. Allow the piston or ball to fall to the bottom of the column. Remove the mouthpiece from your mouth and breathe out normally. Rest for a few seconds and repeat Steps 1 through 7 at least 10 times every 1-2 hours when you are awake. Take your time and take a few normal breaths between deep breaths. The spirometer may include an indicator to show your best effort. Use the indicator as a goal to work toward during each repetition. After each set of 10 deep breaths, practice coughing to be sure your lungs are clear. If you have an incision (the cut made at the time of surgery), support your incision when coughing by placing a pillow or rolled up towels firmly against it. Once you are able to get out of bed, walk around indoors and cough well. You may stop using the incentive spirometer when instructed by your caregiver.  RISKS AND COMPLICATIONS Take your time so you do not get dizzy or light-headed. If you are in pain, you may need to take or ask for pain medication before doing incentive spirometry. It is harder to take a deep breath if you are having pain. AFTER USE Rest and breathe slowly and easily. It can be helpful to keep track of a log of your progress. Your caregiver can provide you with a simple table to help with this. If you are using the spirometer at home, follow these instructions: Sisters IF:  You are having difficultly using the spirometer. You have trouble using the spirometer as often as instructed. Your pain medication is not giving enough relief while using the spirometer. You develop fever of 100.5 F (38.1 C) or higher. SEEK IMMEDIATE MEDICAL CARE IF:  You cough up bloody sputum that had not been present before. You develop fever of 102 F (38.9 C) or greater. You develop worsening pain at or near the incision site. MAKE SURE YOU:  Understand these instructions. Will watch your  condition. Will get help right away if you are not doing well or get worse. Document Released: 05/13/2006 Document Revised: 03/25/2011 Document Reviewed: 07/14/2006 ExitCare Patient  Information 2014 [place], [place].   ________________________________________________________________________

## 2020-12-04 ENCOUNTER — Other Ambulatory Visit: Payer: Self-pay

## 2020-12-04 ENCOUNTER — Encounter (HOSPITAL_COMMUNITY)
Admission: RE | Admit: 2020-12-04 | Discharge: 2020-12-04 | Disposition: A | Discharge status: Home / Self Care | Payer: BC Managed Care – PPO | Source: Ambulatory Visit | Attending: Surgery | Admitting: Surgery

## 2020-12-04 ENCOUNTER — Encounter (HOSPITAL_COMMUNITY): Payer: Self-pay

## 2020-12-04 DIAGNOSIS — Z6841 Body Mass Index (BMI) 40.0 and over, adult: Secondary | ICD-10-CM | POA: Insufficient documentation

## 2020-12-04 DIAGNOSIS — Z01812 Encounter for preprocedural laboratory examination: Secondary | ICD-10-CM | POA: Insufficient documentation

## 2020-12-04 HISTORY — DX: Fibromyalgia: M79.7

## 2020-12-04 LAB — COMPREHENSIVE METABOLIC PANEL
ALT: 13 U/L (ref 0–44)
AST: 15 U/L (ref 15–41)
Albumin: 3.6 g/dL (ref 3.5–5.0)
Alkaline Phosphatase: 76 U/L (ref 38–126)
Anion gap: 5 (ref 5–15)
BUN: 17 mg/dL (ref 6–20)
CO2: 29 mmol/L (ref 22–32)
Calcium: 8.4 mg/dL — ABNORMAL LOW (ref 8.9–10.3)
Chloride: 105 mmol/L (ref 98–111)
Creatinine, Ser: 0.82 mg/dL (ref 0.44–1.00)
GFR, Estimated: >60 mL/min (ref >60)
Glucose, Bld: 114 mg/dL — ABNORMAL HIGH (ref 70–99)
Potassium: 3.8 mmol/L (ref 3.5–5.1)
Sodium: 139 mmol/L (ref 135–145)
Total Bilirubin: 0.4 mg/dL (ref 0.3–1.2)
Total Protein: 7 g/dL (ref 6.5–8.1)

## 2020-12-04 LAB — CBC WITH DIFFERENTIAL/PLATELET
Abs Immature Granulocytes: 0.05 10*3/uL (ref 0.00–0.07)
Basophils Absolute: 0.1 10*3/uL (ref 0.0–0.1)
Basophils Relative: 0 %
Eosinophils Absolute: 0.3 10*3/uL (ref 0.0–0.5)
Eosinophils Relative: 3 %
HCT: 40.2 % (ref 36.0–46.0)
Hemoglobin: 12.8 g/dL (ref 12.0–15.0)
Immature Granulocytes: 0 %
Lymphocytes Relative: 16 %
Lymphs Abs: 1.9 10*3/uL (ref 0.7–4.0)
MCH: 29.8 pg (ref 26.0–34.0)
MCHC: 31.8 g/dL (ref 30.0–36.0)
MCV: 93.5 fL (ref 80.0–100.0)
Monocytes Absolute: 0.5 10*3/uL (ref 0.1–1.0)
Monocytes Relative: 5 %
Neutro Abs: 8.7 10*3/uL — ABNORMAL HIGH (ref 1.7–7.7)
Neutrophils Relative %: 76 %
Platelets: 319 10*3/uL (ref 150–400)
RBC: 4.3 MIL/uL (ref 3.87–5.11)
RDW: 12.9 % (ref 11.5–15.5)
WBC: 11.6 10*3/uL — ABNORMAL HIGH (ref 4.0–10.5)
nRBC: 0 % (ref 0.0–0.2)

## 2020-12-06 ENCOUNTER — Other Ambulatory Visit: Payer: Self-pay

## 2020-12-06 ENCOUNTER — Encounter: Payer: Self-pay | Admitting: Emergency Medicine

## 2020-12-06 ENCOUNTER — Ambulatory Visit
Admission: EM | Admit: 2020-12-06 | Discharge: 2020-12-06 | Disposition: A | Discharge status: Home / Self Care | Payer: BC Managed Care – PPO | Attending: Family Medicine | Admitting: Family Medicine

## 2020-12-06 DIAGNOSIS — J029 Acute pharyngitis, unspecified: Secondary | ICD-10-CM | POA: Insufficient documentation

## 2020-12-06 LAB — POCT RAPID STREP A (OFFICE): Rapid Strep A Screen: NEGATIVE

## 2020-12-06 NOTE — Discharge Instructions (Signed)
  You have been tested for COVID-19 today. If your test returns positive, you will receive a phone call from [facility] regarding your results. Negative test results are not called. Both positive and negative results area always visible on MyChart. If you do not have a MyChart account, sign up instructions are provided in your discharge papers. Please do not hesitate to contact us should you have questions or concerns.  You may use over the counter ibuprofen or acetaminophen as needed.  For a sore throat, over the counter products such as [employer] Peroxyl Mouth Sore Rinse or Chloraseptic Sore Throat Spray may provide some temporary relief. Your rapid strep test was negative today. We have sent your throat swab for culture and will let you know of any positive results.

## 2020-12-06 NOTE — ED Triage Notes (Signed)
Patient c/o bilateral ear pain and sore throat x 4 days.   Patient denies fever at home.   Patient endorses painful swallowing. Patient endorses lymph node swelling.   Patient endorses worsening pain in RT ear.   Patient has taken Nyquil with no relief of symptoms.

## 2020-12-06 NOTE — ED Provider Notes (Signed)
[place]   [id number] 12/06/20 Arrival Time: 2620  ASSESSMENT & PLAN:  1. Sore throat    Rapid strep negative; culture sent. Discussed typical duration of viral illnesses. COVID-19/influenza testing sent. OTC symptom care as needed. Declines Rx cough medication.    Follow-up Information     Celene Squibb, MD.   Specialty: Internal Medicine Why: As needed. Contact information: [facility] [facility] [address] [phone]                 Reviewed expectations re: course of current medical issues. Questions answered. Outlined signs and symptoms indicating need for more acute intervention. Understanding verbalized. After Visit Summary given.   SUBJECTIVE: History from: Patient. Keyly Baldonado is a 45 y.o. female who reports: ST, bilateral otalgia; abrupt onset 4 d ago. Denies: fever and difficulty breathing. Mild cough; OTC helping. Normal PO intake without n/v/d.   OBJECTIVE:  Vitals:   12/06/20 0905  BP: 99/71  Pulse: 91  Resp: 18  Temp: 98.5 F (36.9 C)  TempSrc: Temporal  SpO2: 95%    General appearance: alert; no distress Eyes: PERRLA; EOMI; conjunctiva normal HENT: [place]; AT; with nasal congestion; throat irritated/erythematous; no tonsil enlargement/exudates; uvula is midline Neck: supple  Lungs: speaks full sentences without difficulty; unlabored Extremities: no edema Skin: warm and dry Neurologic: normal gait Psychological: alert and cooperative; normal mood and affect  Labs: Results for orders placed or performed during the hospital encounter of 12/06/20  POCT rapid strep A  Result Value Ref Range   Rapid Strep A Screen Negative Negative   Labs Reviewed  CULTURE, GROUP A STREP ([place])  COVID-19, FLU A+B NAA  POCT RAPID STREP A (OFFICE)     Allergies  Allergen Reactions   Codeine Nausea And Vomiting   Other Nausea Only    Pt has severe nausea with anesthesia   Wound Dressing Adhesive Rash    Past Medical  History:  Diagnosis Date   Anxiety    Arthritis    Bilateral knees and bilateral ankles   Asthma    Bipolar disorder (HCC)    Depression    Family history of adverse reaction to anesthesia    Post op nausea and vomiting  mom and brother   Family history of breast cancer    Family history of kidney cancer    Family history of prostate cancer    Fibromyalgia    GERD (gastroesophageal reflux disease)    Headache    Migraines   History of hiatal hernia    History of shoulder surgery    Hypothyroidism    Obesity    Plantar fasciitis 04/26/2013   Pneumonia    PONV (postoperative nausea and vomiting)    Post op nausea and vomiting   Social History   Socioeconomic History   Marital status: Married    Spouse name: Not on file   Number of children: Not on file   Years of education: Not on file   Highest education level: Not on file  Occupational History   Not on file  Tobacco Use   Smoking status: Never   Smokeless tobacco: Never  Vaping Use   Vaping Use: Never used  Substance and Sexual Activity   Alcohol use: Not Currently   Drug use: No   Sexual activity: Yes    Birth control/protection: Surgical    Comment: hyst  Other Topics Concern   Not on file  Social History Narrative   Not on file  Social Determinants of Health   Financial Resource Strain: Not on file  Food Insecurity: Not on file  Transportation Needs: Not on file  Physical Activity: Not on file  Stress: Not on file  Social Connections: Not on file  Intimate Partner Violence: Not on file   Family History  Problem Relation Age of Onset   Cervical cancer Mother    Diabetes Father    Hypertension Father    Kidney cancer Maternal Aunt        dx in her 37s   Cervical cancer Maternal Aunt    Arthritis Maternal Grandmother    Breast cancer Maternal Grandmother        dx in her 31s   Prostate cancer Maternal Grandfather    Anxiety disorder Son    Asperger's syndrome Son    OCD Son    ADD / ADHD  Son    Depression Son    Breast cancer Paternal Aunt        dx > 34   Lung cancer Paternal Grandmother    Lung cancer Paternal Grandfather    Breast cancer Other        PGMs sister d. 60   Breast cancer Other        PGMs sister d. 25   Past Surgical History:  Procedure Laterality Date   ABDOMINAL HYSTERECTOMY     Bladder repair during hysterectomy   APPENDECTOMY     BREAST DUCTAL SYSTEM EXCISION Left 05/18/2020   Procedure: EXCISION DUCTAL SYSTEM LEFT BREAST;  Surgeon: Rolm Bookbinder, MD;  Location: [place];  Service: General;  Laterality: Left;   CHOLECYSTECTOMY     DILATION AND CURETTAGE OF UTERUS     FRACTURE SURGERY     LEG SURGERY Right    RADIOACTIVE SEED GUIDED EXCISIONAL BREAST BIOPSY Left 05/18/2020   Procedure: RADIOACTIVE SEED GUIDED EXCISIONAL BREAST MASS;  Surgeon: Rolm Bookbinder, MD;  Location: [place];  Service: General;  Laterality: Left;   SHOULDER SURGERY Right      Vanessa Kick, MD 12/06/20 [id number]

## 2020-12-07 LAB — COVID-19, FLU A+B NAA
Influenza A, NAA: NOT DETECTED
Influenza B, NAA: NOT DETECTED
SARS-CoV-2, NAA: NOT DETECTED

## 2020-12-09 LAB — CULTURE, GROUP A STREP (THRC)

## 2020-12-11 ENCOUNTER — Inpatient Hospital Stay (HOSPITAL_COMMUNITY)
Admission: RE | Admit: 2020-12-11 | Discharge: 2020-12-12 | DRG: 621 | Disposition: A | Discharge status: Home / Self Care | Payer: BC Managed Care – PPO | Attending: Surgery | Admitting: Surgery

## 2020-12-11 ENCOUNTER — Inpatient Hospital Stay (HOSPITAL_COMMUNITY): Payer: BC Managed Care – PPO | Admitting: Anesthesiology

## 2020-12-11 ENCOUNTER — Encounter (HOSPITAL_COMMUNITY): Payer: Self-pay | Admitting: Surgery

## 2020-12-11 ENCOUNTER — Encounter (HOSPITAL_COMMUNITY)
Admission: RE | Disposition: A | Discharge status: Home / Self Care | Payer: Self-pay | Source: Home / Self Care | Attending: Surgery

## 2020-12-11 DIAGNOSIS — F419 Anxiety disorder, unspecified: Secondary | ICD-10-CM | POA: Diagnosis present

## 2020-12-11 DIAGNOSIS — Z803 Family history of malignant neoplasm of breast: Secondary | ICD-10-CM | POA: Diagnosis not present

## 2020-12-11 DIAGNOSIS — E039 Hypothyroidism, unspecified: Secondary | ICD-10-CM | POA: Diagnosis present

## 2020-12-11 DIAGNOSIS — M797 Fibromyalgia: Secondary | ICD-10-CM | POA: Diagnosis present

## 2020-12-11 DIAGNOSIS — Z01818 Encounter for other preprocedural examination: Secondary | ICD-10-CM

## 2020-12-11 DIAGNOSIS — K449 Diaphragmatic hernia without obstruction or gangrene: Secondary | ICD-10-CM | POA: Diagnosis present

## 2020-12-11 DIAGNOSIS — Z79899 Other long term (current) drug therapy: Secondary | ICD-10-CM | POA: Diagnosis not present

## 2020-12-11 DIAGNOSIS — Z20822 Contact with and (suspected) exposure to covid-19: Secondary | ICD-10-CM | POA: Diagnosis present

## 2020-12-11 DIAGNOSIS — Z90710 Acquired absence of both cervix and uterus: Secondary | ICD-10-CM | POA: Diagnosis not present

## 2020-12-11 DIAGNOSIS — Z8049 Family history of malignant neoplasm of other genital organs: Secondary | ICD-10-CM | POA: Diagnosis not present

## 2020-12-11 DIAGNOSIS — F319 Bipolar disorder, unspecified: Secondary | ICD-10-CM | POA: Diagnosis present

## 2020-12-11 DIAGNOSIS — Z6841 Body Mass Index (BMI) 40.0 and over, adult: Secondary | ICD-10-CM

## 2020-12-11 DIAGNOSIS — Z801 Family history of malignant neoplasm of trachea, bronchus and lung: Secondary | ICD-10-CM | POA: Diagnosis not present

## 2020-12-11 DIAGNOSIS — K219 Gastro-esophageal reflux disease without esophagitis: Secondary | ICD-10-CM | POA: Diagnosis present

## 2020-12-11 HISTORY — PX: LAPAROSCOPIC GASTRIC SLEEVE RESECTION: SHX5895

## 2020-12-11 HISTORY — PX: UPPER GI ENDOSCOPY: SHX6162

## 2020-12-11 HISTORY — PX: HIATAL HERNIA REPAIR: SHX195

## 2020-12-11 LAB — SARS CORONAVIRUS 2 BY RT PCR (HOSPITAL ORDER, PERFORMED IN ~~LOC~~ HOSPITAL LAB): SARS Coronavirus 2: NEGATIVE

## 2020-12-11 LAB — TYPE AND SCREEN
ABO/RH(D): A POS
Antibody Screen: NEGATIVE

## 2020-12-11 LAB — ABO/RH: ABO/RH(D): A POS

## 2020-12-11 SURGERY — GASTRECTOMY, SLEEVE, LAPAROSCOPIC
Anesthesia: General | Site: Abdomen

## 2020-12-11 MED ORDER — LACTATED RINGERS IR SOLN
Status: DC | PRN
  Administered 2020-12-11: 1000 mL

## 2020-12-11 MED ORDER — ACETAMINOPHEN 500 MG PO TABS
1000.0000 mg | ORAL_TABLET | ORAL | Status: AC
  Administered 2020-12-11: 11:00:00 1000 mg via ORAL
  Filled 2020-12-11: qty 2

## 2020-12-11 MED ORDER — PANTOPRAZOLE SODIUM 40 MG IV SOLR
40.0000 mg | Freq: Every day | INTRAVENOUS | Status: DC
  Administered 2020-12-11: 21:00:00 40 mg via INTRAVENOUS
  Filled 2020-12-11: qty 40

## 2020-12-11 MED ORDER — LIDOCAINE HCL (PF) 2 % IJ SOLN
INTRAMUSCULAR | Status: AC
  Filled 2020-12-11: qty 5

## 2020-12-11 MED ORDER — ROCURONIUM BROMIDE 10 MG/ML (PF) SYRINGE
PREFILLED_SYRINGE | INTRAVENOUS | Status: DC | PRN
  Administered 2020-12-11: 20 mg via INTRAVENOUS
  Administered 2020-12-11: 70 mg via INTRAVENOUS

## 2020-12-11 MED ORDER — BUPIVACAINE-EPINEPHRINE (PF) 0.25% -1:200000 IJ SOLN
INTRAMUSCULAR | Status: AC
  Filled 2020-12-11: qty 30

## 2020-12-11 MED ORDER — BUPIVACAINE LIPOSOME 1.3 % IJ SUSP
INTRAMUSCULAR | Status: DC | PRN
  Administered 2020-12-11: 20 mL

## 2020-12-11 MED ORDER — CHLORHEXIDINE GLUCONATE 4 % EX LIQD: 60.0000 mL | Freq: Once | CUTANEOUS | Status: DC

## 2020-12-11 MED ORDER — SIMETHICONE 80 MG PO CHEW: 80.0000 mg | CHEWABLE_TABLET | Freq: Four times a day (QID) | ORAL | Status: DC | PRN

## 2020-12-11 MED ORDER — HYDROMORPHONE HCL 1 MG/ML IJ SOLN: 0.5000 mg | INTRAMUSCULAR | Status: DC | PRN

## 2020-12-11 MED ORDER — KETOROLAC TROMETHAMINE 30 MG/ML IJ SOLN: 30.0000 mg | Freq: Once | INTRAMUSCULAR | Status: DC | PRN

## 2020-12-11 MED ORDER — METOCLOPRAMIDE HCL 5 MG/ML IJ SOLN
10.0000 mg | Freq: Four times a day (QID) | INTRAMUSCULAR | Status: DC
  Administered 2020-12-11 – 2020-12-12 (×3): 10 mg via INTRAVENOUS
  Filled 2020-12-11 (×3): qty 2

## 2020-12-11 MED ORDER — ENOXAPARIN SODIUM 30 MG/0.3ML IJ SOSY
30.0000 mg | PREFILLED_SYRINGE | Freq: Two times a day (BID) | INTRAMUSCULAR | Status: DC
  Administered 2020-12-11 – 2020-12-12 (×2): 30 mg via SUBCUTANEOUS
  Filled 2020-12-11 (×2): qty 0.3

## 2020-12-11 MED ORDER — PROPOFOL 10 MG/ML IV BOLUS
INTRAVENOUS | Status: AC
  Filled 2020-12-11: qty 20

## 2020-12-11 MED ORDER — GABAPENTIN 100 MG PO CAPS
200.0000 mg | ORAL_CAPSULE | Freq: Two times a day (BID) | ORAL | Status: DC
  Administered 2020-12-11 – 2020-12-12 (×2): 200 mg via ORAL
  Filled 2020-12-11 (×2): qty 2

## 2020-12-11 MED ORDER — HYDROMORPHONE HCL 1 MG/ML IJ SOLN: 0.2500 mg | INTRAMUSCULAR | Status: DC | PRN

## 2020-12-11 MED ORDER — ORAL CARE MOUTH RINSE: 15.0000 mL | Freq: Once | OROMUCOSAL | Status: AC

## 2020-12-11 MED ORDER — ALBUTEROL SULFATE (2.5 MG/3ML) 0.083% IN NEBU
2.5000 mg | INHALATION_SOLUTION | Freq: Once | RESPIRATORY_TRACT | Status: AC | PRN
  Administered 2020-12-11: 14:00:00 2.5 mg via RESPIRATORY_TRACT

## 2020-12-11 MED ORDER — MIDAZOLAM HCL 5 MG/5ML IJ SOLN
INTRAMUSCULAR | Status: DC | PRN
  Administered 2020-12-11: 2 mg via INTRAVENOUS

## 2020-12-11 MED ORDER — AMPHETAMINE-DEXTROAMPHETAMINE 10 MG PO TABS
30.0000 mg | ORAL_TABLET | Freq: Every day | ORAL | Status: DC
  Filled 2020-12-11: qty 3

## 2020-12-11 MED ORDER — FLUOXETINE HCL 20 MG PO CAPS
40.0000 mg | ORAL_CAPSULE | Freq: Every day | ORAL | Status: DC
  Administered 2020-12-12: 40 mg via ORAL
  Filled 2020-12-11: qty 2

## 2020-12-11 MED ORDER — PHENYLEPHRINE 40 MCG/ML (10ML) SYRINGE FOR IV PUSH (FOR BLOOD PRESSURE SUPPORT)
PREFILLED_SYRINGE | INTRAVENOUS | Status: DC | PRN
  Administered 2020-12-11: 120 ug via INTRAVENOUS
  Administered 2020-12-11 (×2): 80 ug via INTRAVENOUS

## 2020-12-11 MED ORDER — OXYCODONE HCL 5 MG/5ML PO SOLN
5.0000 mg | Freq: Four times a day (QID) | ORAL | Status: DC | PRN
  Filled 2020-12-11: qty 5

## 2020-12-11 MED ORDER — DEXAMETHASONE SODIUM PHOSPHATE 10 MG/ML IJ SOLN
INTRAMUSCULAR | Status: DC | PRN
  Administered 2020-12-11: 10 mg via INTRAVENOUS

## 2020-12-11 MED ORDER — METHOCARBAMOL 500 MG IVPB - SIMPLE MED
500.0000 mg | Freq: Four times a day (QID) | INTRAVENOUS | Status: DC | PRN
  Administered 2020-12-11 – 2020-12-12 (×2): 500 mg via INTRAVENOUS
  Filled 2020-12-11: qty 500
  Filled 2020-12-11: qty 50
  Filled 2020-12-11: qty 500

## 2020-12-11 MED ORDER — AMPHETAMINE-DEXTROAMPHETAMINE 30 MG PO TABS: 15.0000 mg | ORAL_TABLET | ORAL | Status: DC

## 2020-12-11 MED ORDER — ALBUTEROL SULFATE (2.5 MG/3ML) 0.083% IN NEBU: 3.0000 mL | INHALATION_SOLUTION | Freq: Four times a day (QID) | RESPIRATORY_TRACT | Status: DC | PRN

## 2020-12-11 MED ORDER — SCOPOLAMINE 1 MG/3DAYS TD PT72
1.0000 | MEDICATED_PATCH | TRANSDERMAL | Status: DC
  Administered 2020-12-11: 11:00:00 1.5 mg via TRANSDERMAL
  Filled 2020-12-11: qty 1

## 2020-12-11 MED ORDER — SUGAMMADEX SODIUM 500 MG/5ML IV SOLN
INTRAVENOUS | Status: AC
  Filled 2020-12-11: qty 5

## 2020-12-11 MED ORDER — PROMETHAZINE HCL 25 MG/ML IJ SOLN: 6.2500 mg | INTRAMUSCULAR | Status: DC | PRN

## 2020-12-11 MED ORDER — TRAMADOL HCL 50 MG PO TABS
50.0000 mg | ORAL_TABLET | Freq: Four times a day (QID) | ORAL | Status: DC | PRN
  Administered 2020-12-11: 17:00:00 50 mg via ORAL
  Filled 2020-12-11: qty 1

## 2020-12-11 MED ORDER — METOPROLOL TARTRATE 5 MG/5ML IV SOLN: 5.0000 mg | Freq: Four times a day (QID) | INTRAVENOUS | Status: DC | PRN

## 2020-12-11 MED ORDER — GABAPENTIN 300 MG PO CAPS
300.0000 mg | ORAL_CAPSULE | ORAL | Status: AC
  Administered 2020-12-11: 11:00:00 300 mg via ORAL
  Filled 2020-12-11: qty 1

## 2020-12-11 MED ORDER — PROPOFOL 10 MG/ML IV BOLUS
INTRAVENOUS | Status: DC | PRN
  Administered 2020-12-11: 200 mg via INTRAVENOUS

## 2020-12-11 MED ORDER — ONDANSETRON HCL 4 MG/2ML IJ SOLN
INTRAMUSCULAR | Status: AC
  Filled 2020-12-11: qty 2

## 2020-12-11 MED ORDER — DOCUSATE SODIUM 100 MG PO CAPS
100.0000 mg | ORAL_CAPSULE | Freq: Two times a day (BID) | ORAL | Status: DC
  Administered 2020-12-11 – 2020-12-12 (×2): 100 mg via ORAL
  Filled 2020-12-11 (×2): qty 1

## 2020-12-11 MED ORDER — AMPHETAMINE-DEXTROAMPHETAMINE 10 MG PO TABS: 15.0000 mg | ORAL_TABLET | Freq: Every day | ORAL | Status: DC

## 2020-12-11 MED ORDER — CHLORHEXIDINE GLUCONATE 0.12 % MT SOLN
15.0000 mL | Freq: Once | OROMUCOSAL | Status: AC
  Administered 2020-12-11: 11:00:00 15 mL via OROMUCOSAL

## 2020-12-11 MED ORDER — LACTATED RINGERS IV SOLN: INTRAVENOUS | Status: DC

## 2020-12-11 MED ORDER — PHENYLEPHRINE 40 MCG/ML (10ML) SYRINGE FOR IV PUSH (FOR BLOOD PRESSURE SUPPORT)
PREFILLED_SYRINGE | INTRAVENOUS | Status: AC
  Filled 2020-12-11: qty 10

## 2020-12-11 MED ORDER — LIDOCAINE 2% (20 MG/ML) 5 ML SYRINGE
INTRAMUSCULAR | Status: DC | PRN
  Administered 2020-12-11: 12:00:00 1.5 mg/kg/h via INTRAVENOUS

## 2020-12-11 MED ORDER — ALBUTEROL SULFATE (2.5 MG/3ML) 0.083% IN NEBU
INHALATION_SOLUTION | RESPIRATORY_TRACT | Status: AC
  Filled 2020-12-11: qty 3

## 2020-12-11 MED ORDER — APREPITANT 40 MG PO CAPS
40.0000 mg | ORAL_CAPSULE | ORAL | Status: AC
  Administered 2020-12-11: 11:00:00 40 mg via ORAL
  Filled 2020-12-11: qty 1

## 2020-12-11 MED ORDER — ACETAMINOPHEN 160 MG/5ML PO SOLN: 1000.0000 mg | Freq: Three times a day (TID) | ORAL | Status: DC

## 2020-12-11 MED ORDER — HYDRALAZINE HCL 20 MG/ML IJ SOLN: 10.0000 mg | INTRAMUSCULAR | Status: DC | PRN

## 2020-12-11 MED ORDER — DEXAMETHASONE SODIUM PHOSPHATE 10 MG/ML IJ SOLN
INTRAMUSCULAR | Status: AC
  Filled 2020-12-11: qty 1

## 2020-12-11 MED ORDER — KETAMINE HCL 10 MG/ML IJ SOLN
INTRAMUSCULAR | Status: DC | PRN
  Administered 2020-12-11: 50 mg via INTRAVENOUS

## 2020-12-11 MED ORDER — LIDOCAINE 2% (20 MG/ML) 5 ML SYRINGE
INTRAMUSCULAR | Status: DC | PRN
  Administered 2020-12-11: 100 mg via INTRAVENOUS

## 2020-12-11 MED ORDER — BUPIVACAINE LIPOSOME 1.3 % IJ SUSP: 20.0000 mL | Freq: Once | INTRAMUSCULAR | Status: DC

## 2020-12-11 MED ORDER — ROCURONIUM BROMIDE 10 MG/ML (PF) SYRINGE
PREFILLED_SYRINGE | INTRAVENOUS | Status: AC
  Filled 2020-12-11: qty 10

## 2020-12-11 MED ORDER — BUPIVACAINE-EPINEPHRINE 0.25% -1:200000 IJ SOLN
INTRAMUSCULAR | Status: DC | PRN
  Administered 2020-12-11: 30 mL

## 2020-12-11 MED ORDER — LAMOTRIGINE 150 MG PO TABS
150.0000 mg | ORAL_TABLET | Freq: Every day | ORAL | Status: DC
  Administered 2020-12-12: 150 mg via ORAL
  Filled 2020-12-11: qty 1

## 2020-12-11 MED ORDER — 0.9 % SODIUM CHLORIDE (POUR BTL) OPTIME
TOPICAL | Status: DC | PRN
  Administered 2020-12-11: 13:00:00 1000 mL

## 2020-12-11 MED ORDER — SUGAMMADEX SODIUM 500 MG/5ML IV SOLN
INTRAVENOUS | Status: DC | PRN
  Administered 2020-12-11: 400 mg via INTRAVENOUS

## 2020-12-11 MED ORDER — HEPARIN SODIUM (PORCINE) 5000 UNIT/ML IJ SOLN
5000.0000 [IU] | INTRAMUSCULAR | Status: AC
  Administered 2020-12-11: 11:00:00 5000 [IU] via SUBCUTANEOUS
  Filled 2020-12-11: qty 1

## 2020-12-11 MED ORDER — ACETAMINOPHEN 500 MG PO TABS
1000.0000 mg | ORAL_TABLET | Freq: Three times a day (TID) | ORAL | Status: DC
  Administered 2020-12-11 – 2020-12-12 (×2): 1000 mg via ORAL
  Filled 2020-12-11 (×3): qty 2

## 2020-12-11 MED ORDER — FENTANYL CITRATE (PF) 250 MCG/5ML IJ SOLN
INTRAMUSCULAR | Status: AC
  Filled 2020-12-11: qty 5

## 2020-12-11 MED ORDER — ONDANSETRON HCL 4 MG/2ML IJ SOLN
INTRAMUSCULAR | Status: DC | PRN
  Administered 2020-12-11: 4 mg via INTRAVENOUS

## 2020-12-11 MED ORDER — DROPERIDOL 2.5 MG/ML IJ SOLN
INTRAMUSCULAR | Status: DC | PRN
  Administered 2020-12-11: .625 mg via INTRAVENOUS

## 2020-12-11 MED ORDER — FENTANYL CITRATE (PF) 250 MCG/5ML IJ SOLN
INTRAMUSCULAR | Status: DC | PRN
  Administered 2020-12-11 (×2): 50 ug via INTRAVENOUS
  Administered 2020-12-11: 100 ug via INTRAVENOUS
  Administered 2020-12-11: 50 ug via INTRAVENOUS

## 2020-12-11 MED ORDER — MIDAZOLAM HCL 2 MG/2ML IJ SOLN
INTRAMUSCULAR | Status: AC
  Filled 2020-12-11: qty 2

## 2020-12-11 MED ORDER — SODIUM CHLORIDE 0.9 % IV SOLN
2.0000 g | INTRAVENOUS | Status: AC
  Administered 2020-12-11: 12:00:00 2 g via INTRAVENOUS
  Filled 2020-12-11: qty 2

## 2020-12-11 MED ORDER — SODIUM CHLORIDE 0.9 % IV SOLN: INTRAVENOUS | Status: DC

## 2020-12-11 MED ORDER — ENSURE MAX PROTEIN PO LIQD
2.0000 [oz_av] | ORAL | Status: DC
  Administered 2020-12-12 (×3): 2 [oz_av] via ORAL

## 2020-12-11 MED ORDER — ONDANSETRON HCL 4 MG/2ML IJ SOLN
4.0000 mg | INTRAMUSCULAR | Status: DC | PRN
  Administered 2020-12-12: 4 mg via INTRAVENOUS
  Filled 2020-12-11: qty 2

## 2020-12-11 SURGICAL SUPPLY — 66 items
APPLIER CLIP ROT 10 11.4 M/L (STAPLE)
APPLIER CLIP ROT 13.4 12 LRG (CLIP)
BAG COUNTER SPONGE SURGICOUNT (BAG) IMPLANT
BAG LAPAROSCOPIC 12 15 PORT 16 (BASKET) IMPLANT
BAG RETRIEVAL 12/15 (BASKET)
BENZOIN TINCTURE PRP APPL 2/3 (GAUZE/BANDAGES/DRESSINGS) ×3 IMPLANT
BLADE SURG 15 STRL LF DISP TIS (BLADE) ×2 IMPLANT
BLADE SURG 15 STRL SS (BLADE) ×3
BLADE SURG SZ11 CARB STEEL (BLADE) IMPLANT
BNDG ADH 1X3 SHEER STRL LF (GAUZE/BANDAGES/DRESSINGS) ×18 IMPLANT
CABLE HIGH FREQUENCY MONO STRZ (ELECTRODE) IMPLANT
CHLORAPREP W/TINT 26 (MISCELLANEOUS) ×6 IMPLANT
CLIP APPLIE ROT 10 11.4 M/L (STAPLE) IMPLANT
CLIP APPLIE ROT 13.4 12 LRG (CLIP) IMPLANT
COVER SURGICAL LIGHT HANDLE (MISCELLANEOUS) ×3 IMPLANT
DECANTER SPIKE VIAL GLASS SM (MISCELLANEOUS) ×3 IMPLANT
DEVICE SUT QUICK LOAD TK 5 (STAPLE) ×3 IMPLANT
DEVICE SUT TI-KNOT TK 5X26 (MISCELLANEOUS) ×3 IMPLANT
DRAPE UTILITY XL STRL (DRAPES) ×6 IMPLANT
ELECT REM PT RETURN 15FT ADLT (MISCELLANEOUS) ×3 IMPLANT
GAUZE SPONGE 4X4 12PLY STRL (GAUZE/BANDAGES/DRESSINGS) IMPLANT
GLOVE SURG ENC MOIS LTX SZ6 (GLOVE) ×3 IMPLANT
GLOVE SURG MICRO LTX SZ6 (GLOVE) ×3 IMPLANT
GLOVE SURG UNDER LTX SZ6.5 (GLOVE) ×3 IMPLANT
GOWN STRL REUS W/TWL LRG LVL3 (GOWN DISPOSABLE) ×3 IMPLANT
GOWN STRL REUS W/TWL XL LVL3 (GOWN DISPOSABLE) ×6 IMPLANT
GRASPER SUT TROCAR 14GX15 (MISCELLANEOUS) ×3 IMPLANT
IRRIG SUCT STRYKERFLOW 2 WTIP (MISCELLANEOUS) ×3
IRRIGATION SUCT STRKRFLW 2 WTP (MISCELLANEOUS) ×2 IMPLANT
KIT BASIN OR (CUSTOM PROCEDURE TRAY) ×3 IMPLANT
KIT TURNOVER KIT A (KITS) ×3 IMPLANT
MARKER SKIN DUAL TIP RULER LAB (MISCELLANEOUS) ×3 IMPLANT
MAT PREVALON FULL STRYKER (MISCELLANEOUS) ×3 IMPLANT
NEEDLE SPNL 22GX3.5 QUINCKE BK (NEEDLE) ×3 IMPLANT
PACK UNIVERSAL I (CUSTOM PROCEDURE TRAY) ×3 IMPLANT
RELOAD ENDO STITCH (ENDOMECHANICALS) ×3 IMPLANT
RELOAD STAPLER BLUE 60MM (STAPLE) ×8 IMPLANT
RELOAD STAPLER GOLD 60MM (STAPLE) ×2 IMPLANT
RELOAD STAPLER GREEN 60MM (STAPLE) ×2 IMPLANT
SCISSORS LAP 5X45 EPIX DISP (ENDOMECHANICALS) IMPLANT
SET TUBE SMOKE EVAC HIGH FLOW (TUBING) ×3 IMPLANT
SHEARS HARMONIC ACE PLUS 45CM (MISCELLANEOUS) ×3 IMPLANT
SLEEVE ADV FIXATION 5X100MM (TROCAR) ×6 IMPLANT
SLEEVE GASTRECTOMY 40FR VISIGI (MISCELLANEOUS) ×3 IMPLANT
SOL ANTI FOG 6CC (MISCELLANEOUS) ×2 IMPLANT
SOLUTION ANTI FOG 6CC (MISCELLANEOUS) ×1
SPONGE T-LAP 18X18 ~~LOC~~+RFID (SPONGE) ×3 IMPLANT
STAPLE LINE REINFORCEMENT LAP (STAPLE) ×15 IMPLANT
STAPLER ECHELON LONG 60 440 (INSTRUMENTS) ×3 IMPLANT
STAPLER RELOAD BLUE 60MM (STAPLE) ×12
STAPLER RELOAD GOLD 60MM (STAPLE) ×3
STAPLER RELOAD GREEN 60MM (STAPLE) ×3
STRIP CLOSURE SKIN 1/2X4 (GAUZE/BANDAGES/DRESSINGS) ×3 IMPLANT
SUT MNCRL AB 4-0 PS2 18 (SUTURE) ×3 IMPLANT
SUT SURGIDAC NAB ES-9 0 48 120 (SUTURE) IMPLANT
SUT VICRYL 0 TIES 12 18 (SUTURE) ×3 IMPLANT
SYR 10ML ECCENTRIC (SYRINGE) ×3 IMPLANT
SYR 20ML LL LF (SYRINGE) ×3 IMPLANT
SYR 50ML LL SCALE MARK (SYRINGE) ×3 IMPLANT
TOWEL OR 17X26 10 PK STRL BLUE (TOWEL DISPOSABLE) ×3 IMPLANT
TOWEL OR NON WOVEN STRL DISP B (DISPOSABLE) ×3 IMPLANT
TROCAR ADV FIXATION 5X100MM (TROCAR) ×3 IMPLANT
TROCAR BLADELESS 15MM (ENDOMECHANICALS) ×3 IMPLANT
TROCAR BLADELESS OPT 5 100 (ENDOMECHANICALS) ×3 IMPLANT
TUBING CONNECTING 10 (TUBING) ×3 IMPLANT
TUBING ENDO SMARTCAP (MISCELLANEOUS) IMPLANT

## 2020-12-11 NOTE — Op Note (Signed)
Operative Note  Shalandria Elsbernd  [id number]  [id number]  12/11/2020   Surgeon: Clovis Riley MD   Assistant: Greer Pickerel MD   Procedure performed: laparoscopic sleeve gastrectomy, hiatal hernia repair, upper endoscopy   Preop diagnosis: Morbid obesity  Post-op diagnosis/intraop findings: same   Specimens: fundus Retained items: none  EBL: minimal  Complications: none   Description of procedure: After obtaining informed consent and administration of chemical DVT prophylaxis in holding, the patient was taken to the operating room and placed supine on operating room table where general endotracheal anesthesia was initiated, preoperative antibiotics were administered, SCDs applied, and a formal timeout was performed. The abdomen was prepped and draped in usual sterile fashion. Peritoneal access was gained using a Visiport technique in the left upper quadrant and insufflation to 15 mmHg ensued without issue. Gross inspection revealed no evidence of injury.  Under direct visualization three more 5 mm trochars were placed in the right and left hemiabdomen and the 30mm trocar in the right paramedian upper abdomen. Bilateral laparoscopic assisted TAPS blocks were performed with Exparel diluted with 0.25 percent Marcaine with epinephrine. The patient was placed in steep Trendelenburg and the liver retractor was introduced through an incision in the upper midline and secured to the post externally to maintain the left lobe retracted anteriorly.  A moderate hiatal hernia was visualized the pars lucida was entered with Harmonic scalpel and the right and left crus were dissected out circumferentially using Harmonic and blunt dissection. The hiatus was narrowed with 2 simple interrupted sutures of 0 Ethibond secured with the ty-knot device.   Using the Harmonic scalpel, the greater curvature of the stomach was dissected away from the greater omentum and short gastric vessels were divided. This began 6 cm from  the pylorus, and dissection proceeded until the left crus was clearly exposed. There were some filmy adhesions of the posterior stomach to the pancreas which were divided with the Harmonic.  The 110 [place] VisiGi was then introduced and directed down towards the pylorus. This was placed to suction against the lesser curve. Serial fires of the linear cutting stapler with seam guards were then employed to create our sleeve. The first fire used a green load and ensured adequate room at the angularis incisura. One gold load and then several blue loads were then employed to create a narrow tubular stomach up to the angle of His. The excised stomach was then removed through our 15 mm trocar site within an Endo Catch bag.  The visigi was taken off of suction and a few puffs of air were introduced, inflating the sleeve. No bubbles were observed in the irrigation fluid around the stomach and the shape was noted to be evenly tubular without any narrowing at the angularis. The visigi was then removed. Upper endoscopy was performed by the assistant surgeon and the sleeve was noted to be airtight, the staple line was hemostatic. Please see his separate note. The endoscope was removed. The 15 mm trocar site fascia in the right upper abdomen was closed with a 0 Vicryl using the laparoscopic suture passer under direct visualization. The liver retractor was removed under direct visualization. The abdomen was then desufflated and all remaining trochars removed. The skin incisions were closed with subcuticular Monocryl; benzoin, Steri-Strips and Band-Aids were applied The patient was then awakened, extubated and taken to PACU in stable condition.     All counts were correct at the completion of the case.

## 2020-12-11 NOTE — Anesthesia Postprocedure Evaluation (Signed)
Anesthesia Post Note  Patient: [name]  Procedure(s) Performed: LAPAROSCOPIC GASTRIC SLEEVE RESECTION (Abdomen) UPPER GI ENDOSCOPY HERNIA REPAIR HIATAL (Abdomen)     Patient location during evaluation: PACU Anesthesia Type: General Level of consciousness: awake and alert Pain management: pain level controlled Vital Signs Assessment: post-procedure vital signs reviewed and stable Respiratory status: spontaneous breathing, nonlabored ventilation, respiratory function stable and patient connected to nasal cannula oxygen Cardiovascular status: blood pressure returned to baseline and stable Postop Assessment: no apparent nausea or vomiting Anesthetic complications: no   No notable events documented.  Last Vitals:  Vitals:   12/11/20 1430 12/11/20 1432  BP:  115/62  Pulse: 88 88  Resp: (!) 24 12  Temp:    SpO2: 95% 96%    Last Pain:  Vitals:   12/11/20 1430  TempSrc:   PainSc: 0-No pain                 Noha Karasik S

## 2020-12-11 NOTE — Anesthesia Preprocedure Evaluation (Signed)
Anesthesia Evaluation  Patient identified by MRN, date of birth, ID band Patient awake    Reviewed: Allergy & Precautions, H&P , NPO status , Patient's Chart, lab work & pertinent test results  History of Anesthesia Complications (+) PONV  Airway Mallampati: II  TM Distance: >3 FB Neck ROM: Full    Dental no notable dental hx.    Pulmonary asthma ,    Pulmonary exam normal breath sounds clear to auscultation       Cardiovascular negative cardio ROS Normal cardiovascular exam Rhythm:Regular Rate:Normal     Neuro/Psych Bipolar Disorder negative neurological ROS     GI/Hepatic Neg liver ROS, GERD  ,  Endo/Other  Hypothyroidism Morbid obesity  Renal/GU negative Renal ROS  negative genitourinary   Musculoskeletal  (+) Arthritis , Fibromyalgia -  Abdominal   Peds negative pediatric ROS (+)  Hematology negative hematology ROS (+)   Anesthesia Other Findings   Reproductive/Obstetrics negative OB ROS                             Anesthesia Physical Anesthesia Plan  ASA: 3  Anesthesia Plan: General   Post-op Pain Management: Tylenol PO (pre-op)   Induction: Intravenous  PONV Risk Score and Plan: 4 or greater and Ondansetron, Dexamethasone, Midazolam, Scopolamine patch - Pre-op, Droperidol and Treatment may vary due to age or medical condition  Airway Management Planned: Oral ETT  Additional Equipment:   Intra-op Plan:   Post-operative Plan: Extubation in OR  Informed Consent: I have reviewed the patients History and Physical, chart, labs and discussed the procedure including the risks, benefits and alternatives for the proposed anesthesia with the patient or authorized representative who has indicated his/her understanding and acceptance.     Dental advisory given  Plan Discussed with: CRNA and Surgeon  Anesthesia Plan Comments:         Anesthesia Quick Evaluation

## 2020-12-11 NOTE — Op Note (Signed)
[name] [id number] 1975-01-21 12/11/2020  Preoperative diagnosis: severe obesity  Postoperative diagnosis: Same   Procedure: upper endoscopy   Surgeon: Leighton Ruff. Mirza Fessel M.D., FACS   Anesthesia: Gen.   Indications for procedure: 45 y.o. year old female undergoing Laparoscopic Gastric Sleeve Resection and an EGD was requested to evaluate the new gastric sleeve.   Description of procedure: After we have completed the sleeve resection, I scrubbed out and obtained the Olympus endoscope. I gently placed endoscope in the patient's oropharynx and gently glided it down the esophagus without any difficulty under direct visualization. Once I was in the gastric sleeve, I insufflated the stomach with air. I was able to cannulate and advanced the scope through the gastric sleeve. I was able to cannulate the duodenum with ease. Dr. Kae Heller had placed saline in the upper abdomen. Upon further insufflation of the gastric sleeve there was no evidence of bubbles. GE junction located at 36 cm.  Upon further inspection of the gastric sleeve, the mucosa appeared normal. There is no evidence of any mucosal abnormality. The sleeve was widely patent at the angularis. There was no evidence of bleeding. The gastric sleeve was decompressed. The scope was withdrawn. The patient tolerated this portion of the procedure well. Please see Dr Ron Parker operative note for details regarding the laparoscopic gastric sleeve resection.   Leighton Ruff. Redmond Pulling, MD, FACS  General, Bariatric, & Minimally Invasive Surgery  [facility], [place]

## 2020-12-11 NOTE — Anesthesia Procedure Notes (Signed)
Procedure Name: Intubation Date/Time: 12/11/2020 12:08 PM Performed by: Maxwell Caul, CRNA Pre-anesthesia Checklist: Patient identified, Emergency Drugs available, Suction available and Patient being monitored Patient Re-evaluated:Patient Re-evaluated prior to induction Oxygen Delivery Method: Circle system utilized Preoxygenation: Pre-oxygenation with 100% oxygen Induction Type: IV induction Ventilation: Mask ventilation without difficulty Laryngoscope Size: Mac and 4 Grade View: Grade I Tube type: Oral Tube size: 7.5 mm Number of attempts: 1 Airway Equipment and Method: Stylet Placement Confirmation: ETT inserted through vocal cords under direct vision, positive ETCO2 and breath sounds checked- equal and bilateral Secured at: 21 cm Tube secured with: Tape Dental Injury: Teeth and Oropharynx as per pre-operative assessment

## 2020-12-11 NOTE — Transfer of Care (Signed)
Immediate Anesthesia Transfer of Care Note  Patient: [name]  Procedure(s) Performed: LAPAROSCOPIC GASTRIC SLEEVE RESECTION (Abdomen) UPPER GI ENDOSCOPY HERNIA REPAIR HIATAL (Abdomen)  Patient Location: PACU  Anesthesia Type:General  Level of Consciousness: awake, alert  and oriented  Airway & Oxygen Therapy: Patient Spontanous Breathing and Patient connected to face mask oxygen  Post-op Assessment: Report given to RN and Post -op Vital signs reviewed and stable  Post vital signs: Reviewed and stable  Last Vitals:  Vitals Value Taken Time  BP 133/79 12/11/20 1350  Temp    Pulse 91 12/11/20 1352  Resp 19 12/11/20 1352  SpO2 85 % 12/11/20 1352  Vitals shown include unvalidated device data.  Last Pain:  Vitals:   12/11/20 1112  TempSrc: Oral         Complications: No notable events documented.

## 2020-12-11 NOTE — Progress Notes (Signed)

## 2020-12-11 NOTE — Interval H&P Note (Signed)
History and Physical Interval Note:  12/11/2020 11:22 AM  [name]  has presented today for surgery, with the diagnosis of MORBID OBESITY.  The various methods of treatment have been discussed with the patient and family. After consideration of risks, benefits and other options for treatment, the patient has consented to  Procedure(s): LAPAROSCOPIC GASTRIC SLEEVE RESECTION (N/A) UPPER GI ENDOSCOPY (N/A) HERNIA REPAIR HIATAL (N/A) as a surgical intervention.  The patient's history has been reviewed, patient examined, no change in status, stable for surgery.  I have reviewed the patient's chart and labs.  Questions were answered to the patient's satisfaction.     [name]

## 2020-12-11 NOTE — Progress Notes (Signed)
PHARMACY CONSULT FOR:  Risk Assessment for Post-Discharge VTE Following Bariatric Surgery  Post-Discharge VTE Risk Assessment: This patient's probability of 30-day post-discharge VTE is increased due to the factors marked:   Female    Age >/=60 years  X  BMI >/=50 kg/m2    CHF    Dyspnea at Rest    Paraplegia  X  Non-gastric-band surgery    Operation Time >/=3 hr    Return to OR     Length of Stay >/= 3 d   Hx of VTE   Hypercoagulable condition   Significant venous stasis    Predicted probability of 30-day post-discharge VTE: 0.27%  Other patient-specific factors to consider: N/A  Recommendation for Discharge: No pharmacologic prophylaxis post-discharge  [name] is a 45 y.o. female who underwent laparoscopic sleeve gastrectomy, hiatal hernia repair, upper endoscopy 12/11/2020   Allergies  Allergen Reactions   Codeine Nausea And Vomiting   Other Nausea Only    Pt has severe nausea with anesthesia   Wound Dressing Adhesive Rash    Patient Measurements:   There is no height or weight on file to calculate BMI.  No results for input(s): WBC, HGB, HCT, PLT, APTT, CREATININE, LABCREA, CREATININE, CREAT24HRUR, MG, PHOS, ALBUMIN, PROT, ALBUMIN, AST, ALT, ALKPHOS, BILITOT, BILIDIR, IBILI in the last 72 hours. Estimated Creatinine Clearance: 120.5 mL/min (by C-G formula based on SCr of 0.82 mg/dL).    Past Medical History:  Diagnosis Date   Anxiety    Arthritis    Bilateral knees and bilateral ankles   Asthma    Bipolar disorder (HCC)    Depression    Family history of adverse reaction to anesthesia    Post op nausea and vomiting  mom and brother   Family history of breast cancer    Family history of kidney cancer    Family history of prostate cancer    Fibromyalgia    GERD (gastroesophageal reflux disease)    Headache    Migraines   History of hiatal hernia    History of shoulder surgery    Hypothyroidism    Obesity    Plantar fasciitis 04/26/2013    Pneumonia    PONV (postoperative nausea and vomiting)    Post op nausea and vomiting     Medications Prior to Admission  Medication Sig Dispense Refill Last Dose   acetaminophen (TYLENOL) 500 MG tablet Take 1,000 mg by mouth every 6 (six) hours as needed for moderate pain.   Past Week   albuterol (VENTOLIN HFA) 108 (90 Base) MCG/ACT inhaler Inhale 1-2 puffs into the lungs every 6 (six) hours as needed for wheezing or shortness of breath.   12/10/2020   amphetamine-dextroamphetamine (ADDERALL) 30 MG tablet Take one tablet in the morning and 1/2 tablet at lunch. (Patient taking differently: Take 15-30 mg by mouth See admin instructions. Take 30 mg by mouth in the morning and 15 mg at lunch if needed while working.) 45 tablet 0    FLUoxetine (PROZAC) 40 MG capsule TAKE 1 CAPSULE BY MOUTH DAILY 30 capsule 5 12/11/2020 at 1000   lamoTRIgine (LAMICTAL) 150 MG tablet Take one tablet at bedtime. (Patient taking differently: Take 150 mg by mouth in the morning.) 30 tablet 5    Polyethyl Glycol-Propyl Glycol (SYSTANE HYDRATION PF OP) Place 1 drop into both eyes 2 (two) times daily as needed (dry eyes).   12/10/2020   silver sulfADIAZINE (SILVADENE) 1 % cream Apply 1 application topically daily. (Patient taking differently: Apply 1 application  topically daily as needed (open wounds).) 100 g 0    methylphenidate (CONCERTA) 36 MG PO CR tablet Take 1 tablet (36 mg total) by mouth daily. 30 tablet 0     Eudelia Bunch, Pharm.D 12/11/2020 4:29 PM

## 2020-12-12 ENCOUNTER — Encounter (HOSPITAL_COMMUNITY): Payer: Self-pay | Admitting: Surgery

## 2020-12-12 ENCOUNTER — Other Ambulatory Visit (HOSPITAL_COMMUNITY): Payer: Self-pay

## 2020-12-12 LAB — COMPREHENSIVE METABOLIC PANEL
ALT: 23 U/L (ref 0–44)
AST: 23 U/L (ref 15–41)
Albumin: 3.4 g/dL — ABNORMAL LOW (ref 3.5–5.0)
Alkaline Phosphatase: 79 U/L (ref 38–126)
Anion gap: 8 (ref 5–15)
BUN: 14 mg/dL (ref 6–20)
CO2: 23 mmol/L (ref 22–32)
Calcium: 8.2 mg/dL — ABNORMAL LOW (ref 8.9–10.3)
Chloride: 103 mmol/L (ref 98–111)
Creatinine, Ser: 0.75 mg/dL (ref 0.44–1.00)
GFR, Estimated: >60 mL/min (ref >60)
Glucose, Bld: 129 mg/dL — ABNORMAL HIGH (ref 70–99)
Potassium: 4.5 mmol/L (ref 3.5–5.1)
Sodium: 134 mmol/L — ABNORMAL LOW (ref 135–145)
Total Bilirubin: 0.5 mg/dL (ref 0.3–1.2)
Total Protein: 6.9 g/dL (ref 6.5–8.1)

## 2020-12-12 LAB — CBC WITH DIFFERENTIAL/PLATELET
Abs Immature Granulocytes: 0.05 10*3/uL (ref 0.00–0.07)
Basophils Absolute: 0 10*3/uL (ref 0.0–0.1)
Basophils Relative: 0 %
Eosinophils Absolute: 0 10*3/uL (ref 0.0–0.5)
Eosinophils Relative: 0 %
HCT: 37.3 % (ref 36.0–46.0)
Hemoglobin: 12.2 g/dL (ref 12.0–15.0)
Immature Granulocytes: 0 %
Lymphocytes Relative: 9 %
Lymphs Abs: 1.1 10*3/uL (ref 0.7–4.0)
MCH: 29.5 pg (ref 26.0–34.0)
MCHC: 32.7 g/dL (ref 30.0–36.0)
MCV: 90.3 fL (ref 80.0–100.0)
Monocytes Absolute: 0.2 10*3/uL (ref 0.1–1.0)
Monocytes Relative: 2 %
Neutro Abs: 10.6 10*3/uL — ABNORMAL HIGH (ref 1.7–7.7)
Neutrophils Relative %: 89 %
Platelets: 331 10*3/uL (ref 150–400)
RBC: 4.13 MIL/uL (ref 3.87–5.11)
RDW: 12.4 % (ref 11.5–15.5)
WBC: 12 10*3/uL — ABNORMAL HIGH (ref 4.0–10.5)
nRBC: 0 % (ref 0.0–0.2)

## 2020-12-12 LAB — MAGNESIUM: Magnesium: 1.9 mg/dL (ref 1.7–2.4)

## 2020-12-12 LAB — SURGICAL PATHOLOGY

## 2020-12-12 MED ORDER — ONDANSETRON 4 MG PO TBDP
4.0000 mg | ORAL_TABLET | Freq: Four times a day (QID) | ORAL | 0 refills | Status: DC | PRN
  Filled 2020-12-12: qty 20, 5d supply, fill #0

## 2020-12-12 MED ORDER — GABAPENTIN 100 MG PO CAPS
200.0000 mg | ORAL_CAPSULE | Freq: Two times a day (BID) | ORAL | 0 refills | Status: DC
  Filled 2020-12-12: qty 20, 5d supply, fill #0

## 2020-12-12 MED ORDER — PANTOPRAZOLE SODIUM 40 MG PO TBEC
40.0000 mg | DELAYED_RELEASE_TABLET | Freq: Every day | ORAL | 0 refills | Status: DC
  Filled 2020-12-12: qty 30, 30d supply, fill #0

## 2020-12-12 MED ORDER — TRAMADOL HCL 50 MG PO TABS
50.0000 mg | ORAL_TABLET | Freq: Four times a day (QID) | ORAL | 0 refills | Status: DC | PRN
  Filled 2020-12-12: qty 10, 3d supply, fill #0

## 2020-12-12 MED ORDER — ACETAMINOPHEN 500 MG PO TABS: 1000.0000 mg | ORAL_TABLET | Freq: Three times a day (TID) | ORAL | 0 refills | Status: AC

## 2020-12-12 NOTE — Discharge Summary (Signed)
Physician Discharge Summary  [place] WER:[id number] DOB: Jun 13, 1975 DOA: 12/11/2020  PCP: Celene Squibb, MD  Admit date: 12/11/2020 Discharge date: 12/12/2020   Recommendations for Outpatient Follow-up:    Follow-up Information     Clovis Riley, MD. Go on 01/04/2021.   Specialty: General Surgery Why: at 9:50am.  Please arrive 15 minutes prior to your appointment time.  Thank you. Contact information: [address] [place] [address] [phone]         [facility], [place]. Go on 02/02/2021.   Specialty: General Surgery Why: at 11:30am with Dr. Kae Heller.  Please arrive 15 minutes prior to your appointment time.  Thank you. Contact information: [place] [address] [phone]                Discharge Diagnoses:  Principal Problem:   Morbid obesity ([place])   Surgical Procedure: Laparoscopic Sleeve Gastrectomy, upper endoscopy  Discharge Condition: Good Disposition: Home  Diet recommendation: Postoperative sleeve gastrectomy diet (liquids only)  There were no vitals filed for this visit.   Hospital Course:  The patient was admitted for a planned laparoscopic sleeve gastrectomy. Please see operative note. Preoperatively the patient was given 5000 units of subcutaneous heparin for DVT prophylaxis. Postoperative prophylactic Lovenox dosing was started on the evening of postoperative day 0. ERAS protocol was used. On the evening of postoperative day 0, the patient was started on water and ice chips. On postoperative day 1 the patient had no fever or tachycardia and was tolerating water in their diet was gradually advanced throughout the day. The patient was ambulating without difficulty. Their vital signs are stable without fever or tachycardia. The patient had received discharge instructions and counseling. They were deemed stable for discharge and had met discharge criteria  PE: Vitals:   12/12/20 0108  12/12/20 0531  BP: 118/75 136/88  Pulse: 76 66  Resp: 16 16  Temp: 97.6 F (36.4 C) 98.1 F (36.7 C)  SpO2: 94% 96%    A&Ox3 Unlabored respirations Abdomen soft, nontender. Incisions c/d/I with dermabond, no cellulitis or hematoma No LE edema  Discharge Instructions   Allergies as of 12/12/2020       Reactions   Codeine Nausea And Vomiting   Other Nausea Only   Pt has severe nausea with anesthesia   Wound Dressing Adhesive Rash        Medication List     TAKE these medications    acetaminophen 500 MG tablet Commonly known as: TYLENOL Take 1,000 mg by mouth every 6 (six) hours as needed for moderate pain. What changed: Another medication with the same name was added. Make sure you understand how and when to take each.   acetaminophen 500 MG tablet Commonly known as: TYLENOL Take 2 tablets (1,000 mg total) by mouth every 8 (eight) hours for 5 days. What changed: You were already taking a medication with the same name, and this prescription was added. Make sure you understand how and when to take each.   albuterol 108 (90 Base) MCG/ACT inhaler Commonly known as: VENTOLIN HFA Inhale 1-2 puffs into the lungs every 6 (six) hours as needed for wheezing or shortness of breath.   amphetamine-dextroamphetamine 30 MG tablet Commonly known as: Adderall Take one tablet in the morning and 1/2 tablet at lunch. What changed:  how much to take how to take this when to take this additional instructions   FLUoxetine 40 MG capsule Commonly known as: PROZAC TAKE  1 CAPSULE BY MOUTH DAILY   gabapentin 100 MG capsule Commonly known as: NEURONTIN Take 2 capsules (200 mg total) by mouth every 12 (twelve) hours.   lamoTRIgine 150 MG tablet Commonly known as: LaMICtal Take one tablet at bedtime. What changed:  how much to take how to take this when to take this additional instructions   methylphenidate 36 MG CR tablet Commonly known as: Concerta Take 1 tablet (36 mg  total) by mouth daily.   ondansetron 4 MG disintegrating tablet Commonly known as: ZOFRAN-ODT Take 1 tablet (4 mg total) by mouth every 6 (six) hours as needed for nausea or vomiting.   pantoprazole 40 MG tablet Commonly known as: PROTONIX Take 1 tablet (40 mg total) by mouth daily.   silver sulfADIAZINE 1 % cream Commonly known as: SILVADENE Apply 1 application topically daily. What changed:  when to take this reasons to take this   SYSTANE HYDRATION PF OP Place 1 drop into both eyes 2 (two) times daily as needed (dry eyes).   traMADol 50 MG tablet Commonly known as: ULTRAM Take 1 tablet (50 mg total) by mouth every 6 (six) hours as needed (pain).        Follow-up Information     Clovis Riley, MD. Go on 01/04/2021.   Specialty: General Surgery Why: at 9:50am.  Please arrive 15 minutes prior to your appointment time.  Thank you. Contact information: [address] [place] [address] [phone]         Surgery, [facility]. Go on 02/02/2021.   Specialty: General Surgery Why: at 11:30am with Dr. Kae Heller.  Please arrive 15 minutes prior to your appointment time.  Thank you. Contact information: [place] [address] [phone]                  The results of significant diagnostics from this hospitalization (including imaging, microbiology, ancillary and laboratory) are listed below for reference.    Significant Diagnostic Studies: No results found.  Labs: Basic Metabolic Panel: Recent Labs  Lab 12/12/20 0427  NA 134*  K 4.5  CL 103  CO2 23  GLUCOSE 129*  BUN 14  CREATININE 0.75  CALCIUM 8.2*  MG 1.9   Liver Function Tests: Recent Labs  Lab 12/12/20 0427  AST 23  ALT 23  ALKPHOS 79  BILITOT 0.5  PROT 6.9  ALBUMIN 3.4*    CBC: Recent Labs  Lab 12/12/20 0427  WBC 12.0*  NEUTROABS 10.6*  HGB 12.2  HCT 37.3  MCV 90.3  PLT 331    CBG: No results for input(s):  GLUCAP in the last 168 hours.  Principal Problem:   Morbid obesity ([place])     Signed:  [facility], [place] [phone] 12/12/2020, 7:59 AM

## 2020-12-12 NOTE — Progress Notes (Signed)
Patient alert and oriented, pain is controlled. Patient is tolerating fluids, advanced to protein shake today, patient is tolerating well.  Reviewed Gastric sleeve discharge instructions with patient and patient is able to articulate understanding.  Provided information on BELT program, Support Group and [facility] outpatient pharmacy. All questions answered, will continue to monitor.  

## 2020-12-12 NOTE — Progress Notes (Signed)
24hr fluid recall: 618mL.  Per dehydration protocol, will call to f/u within one week post op.

## 2020-12-12 NOTE — Discharge Instructions (Signed)

## 2020-12-12 NOTE — Progress Notes (Signed)
Discharge instructions given to patient and all questions were answered.  

## 2020-12-15 ENCOUNTER — Telehealth (HOSPITAL_COMMUNITY): Payer: Self-pay | Admitting: *Deleted

## 2020-12-15 NOTE — Telephone Encounter (Signed)
1.  Tell me about your pain and pain management? Pt states that she has been experiencing some intermittent abdominal cramping while drinking that lasts for a few seconds and then "goes away".  Pt states that "you can just feel it going down".  Pt can tolerate protein shakes and water.  Pt instructed to call [facility] if pain worsens.  2.  Let's talk about fluid intake.  How much total fluid are you taking in? Pt states that she is working to meet goal of 64 oz of fluid today.  Pt has consumed 48oz yesterday and is working first protein shake this morning. Pt states that she is working to meet goal of 64 oz of fluid today.  Pt instructed to assess status and suggestions daily utilizing Hydration Action Plan on discharge folder and to call [facility] if in the "red zone".   3.  How much protein have you taken in the last 2 days? Pt states that she is working to meet goal of goal of 60g of protein today.  Pt has already consumed one protein shake.  Pt plans to drink remainder of protein throughout the rest of the day to meet goal.  4.  Have you had nausea?  Tell me about when have experienced nausea and what you did to help? Pt denies nausea.   5.  Has the frequency or color changed with your urine? Pt states that she is urinating "fine" with no changes in frequency or urgency.     6.  Tell me what your incisions look like? "Incisions look fine". Pt denies a fever, chills.  Pt states incisions are not swollen, open, or draining.  Pt encouraged to call [facility] if incisions change.   7.  Have you been passing gas? BM? Pt states that she is having BMs. Last BM 12/14/20.    8.  If a problem or question were to arise who would you call?  Do you know contact numbers for [facility], [facility], and [facility]? Pt denies dehydration symptoms.  Pt can describe s/sx of dehydration.  Pt knows to call [facility] for surgical, [facility] for nutrition, and [facility] for non-urgent questions or concerns.   9.  How has the walking going? Pt states she is walking  around and able to be active without difficulty.   10. Are you still using your incentive spirometer?  If so, how often? Pt states that she is using I.S. frequently. Pt encouraged to use incentive spirometer, at least 10x every hour while awake until she sees the surgeon.  11.  How are your vitamins and calcium going?  How are you taking them? Pt states that she is taking her supplements and vitamins without much difficulty.  Pt states that she will be using reminders to ensure that she is getting in her 3rd calcium daily.  Reminded patient that the first 30 days post-operatively are important for successful recovery.  Practice good hand hygiene, wearing a mask when appropriate (since optional in most places), and minimizing exposure to people who live outside of the home, especially if they are exhibiting any respiratory, GI, or illness-like symptoms.

## 2020-12-19 ENCOUNTER — Ambulatory Visit: Payer: BC Managed Care – PPO

## 2020-12-26 ENCOUNTER — Encounter: Payer: BC Managed Care – PPO | Attending: Surgery | Admitting: Skilled Nursing Facility1

## 2020-12-26 ENCOUNTER — Other Ambulatory Visit: Payer: Self-pay

## 2020-12-27 NOTE — Progress Notes (Signed)
2 Week Post-Operative Nutrition Class   Patient was seen on 12/26/2020 for Post-Operative Nutrition education at the [facility].    Surgery date: 12/11/2020 Surgery type: sleeve Start weight at [facility]: 307 Weight today: 286 Bowel Habits: Every day to every other day no complaints   Body Composition Scale 12/26/2020  Current Body Weight 286  Total Body Fat % 48.6  Visceral Fat 19  Fat-Free Mass % 51.3   Total Body Water % 40.1  Muscle-Mass lbs 31.6  BMI 50.3  Body Fat Displacement          Torso  lbs 86.4         Left Leg  lbs 17.2         Right Leg  lbs 17.2         Left Arm  lbs 8.6         Right Arm   lbs 8.6      The following the learning objectives were met by the patient during this course: Identifies Phase 3 (Soft, High Proteins) Dietary Goals and will begin from 2 weeks post-operatively to 2 months post-operatively Identifies appropriate sources of fluids and proteins  Identifies appropriate fat sources and healthy verses unhealthy fat types   States protein recommendations and appropriate sources post-operatively Identifies the need for appropriate texture modifications, mastication, and bite sizes when consuming solids Identifies appropriate fat consumption and sources Identifies appropriate multivitamin and calcium sources post-operatively Describes the need for physical activity post-operatively and will follow MD recommendations States when to call healthcare provider regarding medication questions or post-operative complications   Handouts given during class include: Phase 3A: Soft, High Protein Diet Handout Phase 3 High Protein Meals Healthy Fats   Follow-Up Plan: Patient will follow-up at [facility] in 6 weeks for 2 month post-op nutrition visit for diet advancement per MD.

## 2021-01-01 ENCOUNTER — Telehealth: Payer: Self-pay | Admitting: Skilled Nursing Facility1

## 2021-01-01 NOTE — Telephone Encounter (Signed)
RD called pt to verify fluid intake once starting soft, solid proteins 2 week post-bariatric surgery.   Daily Fluid intake:  Daily Protein intake: Bowel Habits:   Concerns/issues:    LVM 

## 2021-02-06 ENCOUNTER — Encounter: Payer: BC Managed Care – PPO | Attending: Surgery | Admitting: Skilled Nursing Facility1

## 2021-02-06 ENCOUNTER — Other Ambulatory Visit: Payer: Self-pay

## 2021-02-06 NOTE — Progress Notes (Signed)
Bariatric Nutrition Follow-Up Visit Medical Nutrition Therapy   NUTRITION ASSESSMENT   Surgery date: 12/11/2020 Surgery type: sleeve Start weight at [facility]: 307 Weight today: 270.7 pounds   Body Composition Scale 12/26/2020 02/06/2021  Current Body Weight 286 270.7  Total Body Fat % 48.6 47.5  Visceral Fat 19 18  Fat-Free Mass % 51.3 52.4   Total Body Water % 40.1 40.7  Muscle-Mass lbs 31.6 31.5  BMI 50.3 47.5  Body Fat Displacement           Torso  lbs 86.4 79.7         Left Leg  lbs 17.2 15.9         Right Leg  lbs 17.2 15.9         Left Arm  lbs 8.6 7.9         Right Arm   lbs 8.6 7.9   Clinical  Medical hx: depression, thyroid disease, anxiety, asthma Medications: see list Labs:  Notable signs/symptoms: a lot of pressure and pain in her thighs and behind her knees Any previous deficiencies? No   Lifestyle & Dietary Hx  Pt states she sometimes gets nausea.    Estimated daily fluid intake: 40-48 oz Estimated daily protein intake: 60+ g Supplements: multi and calcium Current average weekly physical activity: rower and stair stepper elliptical thing: 15 minutes daily feeling winded afterwards   24-Hr Dietary Recall First Meal: greek yogurt or 2 boiled eggs Snack: eggs  Second Meal: lima beans + butter  + salt or pinto beans + chili seasoning + cheese Snack:  protein shake Third Meal: crab meat or protein bowl from [employer] or [employer] Snack: jerky or mung beans Beverages: protein shake (daily as a snack), water + flavorings, water  Post-Op Goals/ Signs/ Symptoms Using straws: no Drinking while eating: no Chewing/swallowing difficulties: no Changes in vision: no Changes to mood/headaches: no Hair loss/changes to skin/nails: no Difficulty focusing/concentrating: no Sweating: no Limb weakness: no Dizziness/lightheadedness: no Palpitations: no Carbonated/caffeinated beverages: no N/V/D/C/Gas: no: mirilax and colace Abdominal pain: no Dumping syndrome:  no    NUTRITION DIAGNOSIS  Overweight/obesity ([place]-3.3) related to past poor dietary habits and physical inactivity as evidenced by completed bariatric surgery and following dietary guidelines for continued weight loss and healthy nutrition status.     NUTRITION INTERVENTION Nutrition counseling (C-1) and education (E-2) to facilitate bariatric surgery goals, including: Diet advancement to the next phase (phase 4) now including non starchy vegetables The importance of consuming adequate calories as well as certain nutrients daily due to the body's need for essential vitamins, minerals, and fats The importance of daily physical activity and to reach a goal of at least 150 minutes of moderate to vigorous physical activity weekly (or as directed by their physician) due to benefits such as increased musculature and improved lab values The importance of intuitive eating specifically learning hunger-satiety cues and understanding the importance of learning a new body: The importance of mindful eating to avoid grazing behaviors   Goals: -Continue to aim for a minimum of 64 fluid ounces 7 days a week with at least 30 ounces being plain water  -Eat non-starchy vegetables 2 times a day 7 days a week  -Start out with soft cooked vegetables today and tomorrow; if tolerated begin to eat raw vegetables or cooked including salads  -Eat your 3 ounces of protein first then start in on your non-starchy vegetables; once you understand how much of your meal leads to satisfaction and not full while  still eating 3 ounces of protein and non-starchy vegetables you can eat them in any order   -Continue to aim for 30 minutes of activity at least 5 times a week  -Do NOT cook with/add to your food: alfredo sauce, cheese sauce, barbeque sauce, ketchup, fat back, butter, bacon grease, grease, Crisco, OR SUGAR   Handouts Provided Include  Phase 4  Learning Style & Readiness for Change Teaching method utilized:  Visual & Auditory  Demonstrated degree of understanding via: Teach Back  Readiness Level: action Barriers to learning/adherence to lifestyle change: non identified   RD's Notes for Next Visit Assess adherence to pt chosen goals    MONITORING & EVALUATION Dietary intake, weekly physical activity, body weight  Next Steps Patient is to follow-up in 3-4 months

## 2021-03-29 ENCOUNTER — Ambulatory Visit: Payer: BC Managed Care – PPO | Admitting: Adult Health

## 2021-04-18 ENCOUNTER — Encounter (HOSPITAL_COMMUNITY): Payer: Self-pay

## 2021-04-24 ENCOUNTER — Encounter: Payer: Commercial Managed Care - PPO | Attending: Surgery | Admitting: Skilled Nursing Facility1

## 2021-04-24 DIAGNOSIS — Z713 Dietary counseling and surveillance: Secondary | ICD-10-CM | POA: Insufficient documentation

## 2021-04-24 DIAGNOSIS — Z6841 Body Mass Index (BMI) 40.0 and over, adult: Secondary | ICD-10-CM | POA: Insufficient documentation

## 2021-04-25 NOTE — Progress Notes (Signed)
Follow-up visit:  Post-Operative 04/24/2021 Surgery ? ?Medical Nutrition Therapy:  Appt start time: 6:00pm end time:  7:00pm ? ?Primary concerns today: Post-operative Bariatric Surgery Nutrition Management 6 Month Post-Op Class ? ?Surgery date: 12/11/2020 ?Surgery type: sleeve ?Start weight at [facility]: 307 ?Weight today: 250.1 pounds ?  ?Body Composition Scale 12/26/2020 02/06/2021 04/24/2021  ?Current Body Weight 286 270.7 250.1  ?Total Body Fat % 48.6 47.5 45.8  ?Visceral Fat '19 18 16  '$ ?Fat-Free Mass % 51.3 52.4 54.1  ? Total Body Water % 40.1 40.7 41.5  ?Muscle-Mass lbs 31.6 31.5 31.1  ?BMI 50.3 47.5 43.9  ?Body Fat Displacement     ?       Torso  lbs 86.4 79.7 71.2  ?       Left Leg  lbs 17.2 15.9 14.2  ?       Right Leg  lbs 17.2 15.9 14.2  ?       Left Arm  lbs 8.6 7.9 7.1  ?       Right Arm   lbs 8.6 7.9 7.1  ? ? ? ?Information Reviewed/ Discussed During Appointment: ?-Review of composition scale numbers ?-Fluid requirements (64-100 ounces) ?-Protein requirements (60-80g) ?-Strategies for tolerating diet ?-Advancement of diet to include Starchy vegetables ?-Barriers to inclusion of new foods ?-Inclusion of appropriate multivitamin and calcium supplements  ?-Exercise recommendations  ? ?Fluid intake: adequate  ? ?Medications: See List ?Supplementation: appropriate  ? ?Using straws: no ?Drinking while eating: no ?Having you been chewing well: yes ?Chewing/swallowing difficulties: no ?Changes in vision: no ?Changes to mood/headaches: no ?Hair loss/Cahnges to skin/Changes to nails: no ?Any difficulty focusing or concentrating: no ?Sweating: no ?Dizziness/Lightheaded: no ?Palpitations: no  ?Carbonated beverages: no ?N/V/D/C/GAS: no ?Abdominal Pain: no ?Dumping syndrome: no ? ?Recent physical activity:  ADL's ? ?Progress Towards Goal(s):  In Progress ?Teaching method utilized: Visual & Auditory  ?Demonstrated degree of understanding via: Teach Back  ?Readiness Level: Action ?Barriers to learning/adherence to lifestyle  change: none identified ? ?Handouts given during visit include: ?Phase V diet Progression  ?Goals Sheet ?The Benefits of Exercise are endless..... ?Support Group Topics ? ?Teaching Method Utilized:  ?Visual ?Auditory ?Hands on ? ?Demonstrated degree of understanding via:  Teach Back  ? ?Monitoring/Evaluation:  Dietary intake, exercise, and body weight. Follow up in 3 months for 9 month post-op visit. ? ?

## 2021-05-18 ENCOUNTER — Other Ambulatory Visit: Payer: Self-pay | Admitting: Family Medicine

## 2021-05-18 ENCOUNTER — Other Ambulatory Visit (HOSPITAL_COMMUNITY): Payer: Self-pay | Admitting: Family Medicine

## 2021-05-18 DIAGNOSIS — R2689 Other abnormalities of gait and mobility: Secondary | ICD-10-CM

## 2021-05-21 ENCOUNTER — Other Ambulatory Visit (HOSPITAL_COMMUNITY): Payer: Self-pay | Admitting: Family Medicine

## 2021-05-21 DIAGNOSIS — Z1231 Encounter for screening mammogram for malignant neoplasm of breast: Secondary | ICD-10-CM

## 2021-05-29 ENCOUNTER — Encounter: Payer: Self-pay | Admitting: Internal Medicine

## 2021-05-30 ENCOUNTER — Ambulatory Visit (HOSPITAL_COMMUNITY): Admission: RE | Admit: 2021-05-30 | Payer: Commercial Managed Care - PPO | Source: Ambulatory Visit

## 2021-05-30 ENCOUNTER — Ambulatory Visit (HOSPITAL_COMMUNITY)
Admission: RE | Admit: 2021-05-30 | Discharge: 2021-05-30 | Disposition: A | Discharge status: Home / Self Care | Payer: Commercial Managed Care - PPO | Source: Ambulatory Visit | Attending: Family Medicine | Admitting: Family Medicine

## 2021-05-30 DIAGNOSIS — R2689 Other abnormalities of gait and mobility: Secondary | ICD-10-CM

## 2021-07-12 ENCOUNTER — Encounter: Payer: Self-pay | Admitting: Gastroenterology

## 2021-07-12 ENCOUNTER — Ambulatory Visit: Payer: Commercial Managed Care - PPO | Admitting: Gastroenterology

## 2021-07-12 ENCOUNTER — Telehealth: Payer: Self-pay | Admitting: *Deleted

## 2021-07-12 DIAGNOSIS — K648 Other hemorrhoids: Secondary | ICD-10-CM | POA: Diagnosis not present

## 2021-07-12 NOTE — Telephone Encounter (Addendum)
Patient scheduled for TCS with Dr. Abbey Chatters ASA 3 on 8/14. She is requesting to do a pill prep d/t h/o having gastric sleeve done. Please advise if okay? Thanks   Pa submitted via [facility]. Case ID# [id number]

## 2021-07-12 NOTE — Progress Notes (Signed)
Gastroenterology Office Note    Referring Provider: Celene Squibb, MD Primary Care Physician:  Celene Squibb, MD  Primary GI: Dr. Abbey Chatters   Chief Complaint   Chief Complaint  Patient presents with   New Patient (Initial Visit)    Pt was referred for hemorrhoids     History of Present Illness   [name] is a 46 y.o. female presenting today at the request of Celene Squibb, MD due to symptomatic hemorrhoids. No prior colonoscopy. No family history of colon cancer but does have family history of polyps (mother).   Chronic history of hemorrhoids, occasional prolapsing, pressure. Chronic low-volume hematochezia. No constipation. S  Gastric sleeve 7 months ago. Feels like not normal shape BM since hemorrhoids have worsened. No straining. Sometimes prolonged toilet time.   No other GI concerns.     Past Medical History:  Diagnosis Date   Anxiety    Arthritis    Bilateral knees and bilateral ankles   Asthma    Autism    Bipolar disorder (HCC)    Depression    Family history of adverse reaction to anesthesia    Post op nausea and vomiting  mom and brother   Family history of breast cancer    Family history of kidney cancer    Family history of prostate cancer    GERD (gastroesophageal reflux disease)    Headache    Migraines   History of hiatal hernia    History of shoulder surgery    Hypothyroidism    Obesity    Plantar fasciitis 04/26/2013   Pneumonia    PONV (postoperative nausea and vomiting)    Post op nausea and vomiting    Past Surgical History:  Procedure Laterality Date   ABDOMINAL HYSTERECTOMY     Bladder repair during hysterectomy   APPENDECTOMY     BREAST DUCTAL SYSTEM EXCISION Left 05/18/2020   Procedure: EXCISION DUCTAL SYSTEM LEFT BREAST;  Surgeon: Rolm Bookbinder, MD;  Location: [place] [facility];  Service: General;  Laterality: Left;   CHOLECYSTECTOMY     DILATION AND CURETTAGE OF UTERUS     FRACTURE SURGERY     HIATAL HERNIA REPAIR N/A 12/11/2020    Procedure: HERNIA REPAIR HIATAL;  Surgeon: Clovis Riley, MD;  Location: [facility];  Service: General;  Laterality: N/A;   LAPAROSCOPIC GASTRIC SLEEVE RESECTION N/A 12/11/2020   Procedure: LAPAROSCOPIC GASTRIC SLEEVE RESECTION;  Surgeon: Clovis Riley, MD;  Location: [facility];  Service: General;  Laterality: N/A;   LEG SURGERY Right    RADIOACTIVE SEED GUIDED EXCISIONAL BREAST BIOPSY Left 05/18/2020   Procedure: RADIOACTIVE SEED GUIDED EXCISIONAL BREAST MASS;  Surgeon: Rolm Bookbinder, MD;  Location: [place];  Service: General;  Laterality: Left;   SHOULDER SURGERY Right    UPPER GI ENDOSCOPY N/A 12/11/2020   Procedure: UPPER GI ENDOSCOPY;  Surgeon: Clovis Riley, MD;  Location: [facility];  Service: General;  Laterality: N/A;    Current Outpatient Medications  Medication Sig Dispense Refill   acetaminophen (TYLENOL) 500 MG tablet Take 1,000 mg by mouth every 6 (six) hours as needed for moderate pain.     albuterol (VENTOLIN HFA) 108 (90 Base) MCG/ACT inhaler Inhale 1-2 puffs into the lungs every 6 (six) hours as needed for wheezing or shortness of breath.     amphetamine-dextroamphetamine (ADDERALL) 30 MG tablet Take one tablet in the morning and 1/2 tablet at lunch. (Patient taking differently: Take 15-30 mg by mouth See admin instructions.  Take 30 mg by mouth in the morning and 15 mg at lunch if needed while working.) 45 tablet 0   FLUoxetine (PROZAC) 40 MG capsule TAKE 1 CAPSULE BY MOUTH DAILY 30 capsule 5   lamoTRIgine (LAMICTAL) 150 MG tablet Take one tablet at bedtime. (Patient taking differently: Take 150 mg by mouth in the morning.) 30 tablet 5   Multiple Vitamin (MULTIVITAMIN WITH MINERALS) TABS tablet Take 1 tablet by mouth daily.     No current facility-administered medications for this visit.    Allergies as of 07/12/2021 - Review Complete 07/12/2021  Allergen Reaction Noted   Codeine Nausea And Vomiting 10/13/2010   Other Nausea Only 05/05/2020   Wound dressing adhesive  Rash 05/05/2020    Family History  Problem Relation Age of Onset   Cervical cancer Mother    Colon polyps Mother    Diabetes Father    Hypertension Father    Arthritis Maternal Grandmother    Breast cancer Maternal Grandmother        dx in her 64s   Prostate cancer Maternal Grandfather    Lung cancer Paternal Grandmother    Lung cancer Paternal Grandfather    Anxiety disorder Son    Asperger's syndrome Son    OCD Son    ADD / ADHD Son    Depression Son    Kidney cancer Maternal Aunt        dx in her 78s   Cervical cancer Maternal Aunt    Breast cancer Paternal Aunt        dx > 59   Breast cancer Other        PGMs sister d. 33   Breast cancer Other        PGMs sister d. 75   Colon cancer Neg Hx     Social History   Socioeconomic History   Marital status: Married    Spouse name: Not on file   Number of children: Not on file   Years of education: Not on file   Highest education level: Not on file  Occupational History   Not on file  Tobacco Use   Smoking status: Never   Smokeless tobacco: Never  Vaping Use   Vaping Use: Never used  Substance and Sexual Activity   Alcohol use: Not Currently   Drug use: No   Sexual activity: Yes    Birth control/protection: Surgical    Comment: hyst  Other Topics Concern   Not on file  Social History Narrative   Photographer, and works at [employer].    Social Determinants of Health   Financial Resource Strain: Not on file  Food Insecurity: Not on file  Transportation Needs: Not on file  Physical Activity: Not on file  Stress: Not on file  Social Connections: Not on file  Intimate Partner Violence: Not on file     Review of Systems   Gen: Denies any fever, chills, fatigue, weight loss, lack of appetite.  CV: Denies chest pain, heart palpitations, peripheral edema, syncope.  Resp: Denies shortness of breath at rest or with exertion. Denies wheezing or cough.  GI: see HPI GU : Denies urinary burning, urinary  frequency, urinary hesitancy MS: Denies joint pain, muscle weakness, cramps, or limitation of movement.  Derm: Denies rash, itching, dry skin Psych: Denies depression, anxiety, memory loss, and confusion Heme: Denies bruising, bleeding, and enlarged lymph nodes.   Physical Exam   BP 108/72   Pulse 83   Temp (!) 97.2 F (  36.2 C)   Ht '5\' 3"'$  (1.6 m)   Wt 235 lb 6.4 oz (106.8 kg)   BMI 41.70 kg/m  General:   Alert and oriented. Pleasant and cooperative. Well-nourished and well-developed.  Head:  Normocephalic and atraumatic. Eyes:  Without icterus Ears:  Normal auditory acuity. Rectal: external hemorrhoid tag, DRE without mass. Anoscopy performed with prominence of right anterior, left lateral column predominantly, Grade 2-3 hemorrhoids. Right posterior also present.  Msk:  Symmetrical without gross deformities. Normal posture. Extremities:  Without edema. Neurologic:  Alert and  oriented x4;  grossly normal neurologically. Skin:  Intact without significant lesions or rashes. Psych:  Alert and cooperative. Normal mood and affect.   Assessment   [name] is a 46 y.o. female presenting today with symptomatic hemorrhoids for many years and an excellent candidate for banding. Anoscopy was performed today as well. She is due for initial screening colonoscopy.   Notably, her mother has history of polyps with regular interval surveillance; she has no family history of colon cancer.   We discussed banding in detail today. Will set up for dedicated banding appointment in near future and also arrange initial screening colonoscopy.    PLAN    Proceed with colonoscopy by Dr. Abbey Chatters  in near future: the risks, benefits, and alternatives have been discussed with the patient in detail. The patient states understanding and desires to proceed.   Banding in 1-2 weeks  Limit toilet time, avoid straining  Annitta Needs, PhD, [facility] Gastroenter[facility]

## 2021-07-12 NOTE — Telephone Encounter (Signed)
Okay to do su tabs thank you

## 2021-07-12 NOTE — Patient Instructions (Signed)
I will see you back for a dedicated banding appointment in the very near future!  Avoid straining, limit toilet time to 2-3 minutes.   We will arrange a colonoscopy when it works for you in the future!  It was a pleasure to see you today. I want to create trusting relationships with patients to provide genuine, compassionate, and quality care. I value your feedback. If you receive a survey regarding your visit,  I greatly appreciate you taking time to fill this out.   Annitta Needs, PhD, ANP-BC [facility] Gastroenterology

## 2021-07-13 ENCOUNTER — Telehealth: Payer: Self-pay | Admitting: Adult Health

## 2021-07-13 DIAGNOSIS — F422 Mixed obsessional thoughts and acts: Secondary | ICD-10-CM

## 2021-07-13 MED ORDER — FLUOXETINE HCL 40 MG PO CAPS: ORAL_CAPSULE | ORAL | 5 refills | Status: DC

## 2021-07-13 MED ORDER — SUTAB 1479-225-188 MG PO TABS: ORAL_TABLET | ORAL | 0 refills | Status: DC

## 2021-07-13 NOTE — Telephone Encounter (Signed)
Next visit is 08/02/21. [name] called requesting a refill on her Fluoxetine 40 mg called to:  [place], [place]. [place] [place]  Phone:  [phone]  Fax:  [phone]

## 2021-07-13 NOTE — Telephone Encounter (Signed)
Rx sent 

## 2021-07-13 NOTE — Telephone Encounter (Signed)
No PA required. Rx sent in for sutabs. Instructions/pre-op mailed

## 2021-07-23 ENCOUNTER — Encounter: Payer: Self-pay | Admitting: Skilled Nursing Facility1

## 2021-07-23 ENCOUNTER — Encounter: Payer: Commercial Managed Care - PPO | Attending: Surgery | Admitting: Skilled Nursing Facility1

## 2021-07-23 DIAGNOSIS — Z713 Dietary counseling and surveillance: Secondary | ICD-10-CM | POA: Insufficient documentation

## 2021-07-23 DIAGNOSIS — E669 Obesity, unspecified: Secondary | ICD-10-CM

## 2021-07-23 NOTE — Progress Notes (Signed)
Follow-up visit:  Post-Operative 04/24/2021 Surgery   Surgery date: 12/11/2020 Surgery type: sleeve Start weight at [facility]: 307 Weight today: 233.8 pounds   Body Composition Scale 12/26/2020 02/06/2021 04/24/2021 07/23/2021  Current Body Weight 286 270.7 250.1 233.8  Total Body Fat % 48.6 47.5 45.8 44.2  Visceral Fat '19 18 16 15  '$ Fat-Free Mass % 51.3 52.4 54.1 55.7   Total Body Water % 40.1 40.7 41.5 42.3  Muscle-Mass lbs 31.6 31.5 31.1 30.9  BMI 50.3 47.5 43.9 41  Body Fat Displacement             Torso  lbs 86.4 79.7 71.2 64         Left Leg  lbs 17.2 15.9 14.2 12.8         Right Leg  lbs 17.2 15.9 14.2 12.8         Left Arm  lbs 8.6 7.9 7.1 6.4         Right Arm   lbs 8.6 7.9 7.1 6.4    Pt states her husband is a really good cook. Pt states she watches her calories. 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  Pt state she is worried she is not losing enough weight fast enough but does see a lot of results with inches lost.    24 hr recall: Breakfast: egg or protein bar or egg bite Snack: protein shake or snack almond/cheese Lunch: eaten out salad or frozen meal protein bowl Snack: protein bar Dinner: hamburger steak + steamed vegetables or black beans Snack:   Beverages: sugar free vanilla oatmilk latte, water + flavoring (3 days out of one packet), water  Fluid intake: 80 ounces   Medications: See List Supplementation: appropriate   Using straws: no Drinking while eating: no Having you been chewing well: yes Chewing/swallowing difficulties: no Changes in vision: no Changes to mood/headaches: no Hair loss/Cahnges to skin/Changes to nails: no Any difficulty focusing or concentrating: no Sweating: no Dizziness/Lightheaded: no Palpitations: no  Carbonated beverages: no N/V/D/C/GAS: no Abdominal Pain: no Dumping syndrome: no  Recent physical activity:  animal [employer] for work, recumbent bike, some kind  of cardio machine   Progress Towards Goal(s):  In Progress Teaching method utilized: Environmental health practitioner & Auditory  Demonstrated degree of understanding via: Teach Back  Readiness Level: Action Barriers to learning/adherence to lifestyle change: none identified   Teaching Method Utilized:  Visual Auditory Hands on  Demonstrated degree of understanding via:  Teach Back   Monitoring/Evaluation:  Dietary intake, exercise, and body weight.

## 2021-07-26 ENCOUNTER — Ambulatory Visit (INDEPENDENT_AMBULATORY_CARE_PROVIDER_SITE_OTHER): Payer: Commercial Managed Care - PPO | Admitting: Gastroenterology

## 2021-07-26 ENCOUNTER — Encounter: Payer: Self-pay | Admitting: Gastroenterology

## 2021-07-26 VITALS — BP 106/74 | HR 87 | Temp 97.7°F | Ht 63.0 in | Wt 233.8 lb

## 2021-07-26 DIAGNOSIS — K648 Other hemorrhoids: Secondary | ICD-10-CM | POA: Diagnosis not present

## 2021-07-26 NOTE — Progress Notes (Signed)
    [place] BANDING PROCEDURE NOTE  [name] is a 46 y.o. female presenting today for consideration of hemorrhoid banding. Colonoscopy upcoming. She has desired banding in the interim. Anoscopy done at recent appointment with no mass. Notably had prominence of right anterior and left column predominantly.    The patient presents with symptomatic grade 2-3 hemorrhoids, unresponsive to maximal medical therapy, requesting rubber band ligation of his/her hemorrhoidal disease. All risks, benefits, and alternative forms of therapy were described and informed consent was obtained.  The decision was made to band the right anterior internal hemorrhoid, and the Hart was used to perform band ligation without complication. Digital anorectal examination was then performed to assure proper positioning of the band, and to adjust the banded tissue as required. The patient was discharged home without pain or other issues. Dietary and behavioral recommendations were given, along with follow-up instructions. The patient will return in several weeks for followup and possible additional banding as required.  No complications were encountered and the patient tolerated the procedure well.   Annitta Needs, PhD, ANP-BC [facility] Gastroenterology

## 2021-07-26 NOTE — Patient Instructions (Signed)
Please continue to avoid straining.  You should limit your toilet time to 2-3 minutes at the most.   Continue to avoid constipation.  Please call me with any concerns or issues!  I will see you in follow-up for additional banding in several weeks.    I enjoyed seeing you again today! As you know, I value our relationship and want to provide genuine, compassionate, and quality care. I welcome your feedback. If you receive a survey regarding your visit,  I greatly appreciate you taking time to fill this out. See you next time!  Isreal Moline W. Laurynn Mccorvey, PhD, ANP-BC [facility]       

## 2021-07-27 ENCOUNTER — Other Ambulatory Visit (HOSPITAL_COMMUNITY): Payer: Self-pay

## 2021-08-02 ENCOUNTER — Ambulatory Visit (INDEPENDENT_AMBULATORY_CARE_PROVIDER_SITE_OTHER): Payer: Commercial Managed Care - PPO | Admitting: Adult Health

## 2021-08-02 ENCOUNTER — Encounter: Payer: Self-pay | Admitting: Adult Health

## 2021-08-02 DIAGNOSIS — F411 Generalized anxiety disorder: Secondary | ICD-10-CM

## 2021-08-02 DIAGNOSIS — F331 Major depressive disorder, recurrent, moderate: Secondary | ICD-10-CM | POA: Diagnosis not present

## 2021-08-02 DIAGNOSIS — F410 Panic disorder [episodic paroxysmal anxiety] without agoraphobia: Secondary | ICD-10-CM

## 2021-08-02 DIAGNOSIS — F902 Attention-deficit hyperactivity disorder, combined type: Secondary | ICD-10-CM

## 2021-08-02 DIAGNOSIS — F422 Mixed obsessional thoughts and acts: Secondary | ICD-10-CM

## 2021-08-02 DIAGNOSIS — G4700 Insomnia, unspecified: Secondary | ICD-10-CM

## 2021-08-02 MED ORDER — FLUOXETINE HCL 40 MG PO CAPS: ORAL_CAPSULE | ORAL | 5 refills | Status: DC

## 2021-08-02 MED ORDER — LAMOTRIGINE 150 MG PO TABS: ORAL_TABLET | ORAL | 5 refills | Status: DC

## 2021-08-02 MED ORDER — AMPHETAMINE-DEXTROAMPHETAMINE 30 MG PO TABS: 30.0000 mg | ORAL_TABLET | Freq: Two times a day (BID) | ORAL | 0 refills | Status: DC

## 2021-08-02 MED ORDER — AMPHETAMINE-DEXTROAMPHETAMINE 30 MG PO TABS: ORAL_TABLET | ORAL | 0 refills | Status: DC

## 2021-08-02 NOTE — Progress Notes (Signed)
[name] [id number] October 01, 1975 46 y.o.  Subjective:   Patient ID:  [name] is a 46 y.o. (DOB [birth year]) female.  Chief Complaint: No chief complaint on file.   HPI [name] presents to the office today for follow-up of ADHD, GAD, MDD, insomnia.   Describes mood today as "ok". Pleasant. Mood symptoms - denies depression, anxiety and irritability. Denies recent panic attacks. Mood is consistent. Stating "I'm doing good". Feels like medications are helpful. She and family doing well. Taking medications as prescribed.  Energy levels low. Active, has a regular exercise routine.  Enjoys some usual interests and activities. Married. Lives husband of 41 years and 3 sons. Spending time with family. Appetite varies. Weight loss 226 pounds from 309 pounds.  Sleep is about the same. Averages 8 hours.  Focus and concentration stable with Adderall. Completing tasks. Managing aspects of household. 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  Denies SI or HI.  Denies AH or VH.  Previous medications: Seroquel - tremors, Wellbutrin   PHQ2-9    Flowsheet Row Nutrition [facility]/18/2020 in [facility] Office Visit from 07/29/2016 in [facility]  PHQ-2 Total Score 0 6  PHQ-9 Total Score -- 21      Flowsheet Row ED from 12/06/2020 in [facility] [place] from 12/04/2020 in [facility] Admission (Discharged) from 05/18/2020 in [facility] No Risk No Risk Low Risk        Review of Systems:  Review of Systems  Musculoskeletal:  Negative for gait problem.  Neurological:  Negative for tremors.  Psychiatric/Behavioral:         Please refer to HPI    Medications: I have reviewed the patient's current medications.  Current Outpatient Medications  Medication Sig Dispense Refill   acetaminophen (TYLENOL) 500 MG tablet  Take 1,000 mg by mouth every 6 (six) hours as needed for moderate pain.     albuterol (VENTOLIN HFA) 108 (90 Base) MCG/ACT inhaler Inhale 1-2 puffs into the lungs every 6 (six) hours as needed for wheezing or shortness of breath.     amphetamine-dextroamphetamine (ADDERALL) 30 MG tablet Take one tablet in the morning and 1/2 tablet at lunch. (Patient taking differently: Take 15-30 mg by mouth See admin instructions. Take 30 mg by mouth in the morning and 15 mg at lunch if needed while working.) 45 tablet 0   FLUoxetine (PROZAC) 40 MG capsule TAKE 1 CAPSULE BY MOUTH DAILY 30 capsule 5   lamoTRIgine (LAMICTAL) 150 MG tablet Take one tablet at bedtime. (Patient taking differently: Take 150 mg by mouth in the morning.) 30 tablet 5   Multiple Vitamin (MULTIVITAMIN WITH MINERALS) TABS tablet Take 1 tablet by mouth daily.     Sodium Sulfate-Mag Sulfate-KCl (SUTAB) [phone] MG TABS As directed (Patient not taking: Reported on 07/26/2021) 24 tablet 0   No current facility-administered medications for this visit.    Medication Side Effects: None  Allergies:  Allergies  Allergen Reactions   Codeine Nausea And Vomiting   Other Nausea Only    Pt has severe nausea with anesthesia   Wound Dressing Adhesive Rash    Past Medical History:  Diagnosis Date   Anxiety    Arthritis    Bilateral knees and bilateral ankles   Asthma    Autism    Bipolar disorder ([place])    Depression    Family history of  adverse reaction to anesthesia    Post op nausea and vomiting  mom and brother   Family history of breast cancer    Family history of kidney cancer    Family history of prostate cancer    GERD (gastroesophageal reflux disease)    Headache    Migraines   History of hiatal hernia    History of shoulder surgery    Hypothyroidism    Obesity    Plantar fasciitis 04/26/2013   Pneumonia    PONV (postoperative nausea and vomiting)    Post op nausea and vomiting    Past Medical History, Surgical  history, Social history, and Family history were reviewed and updated as appropriate.   Please see review of systems for further details on the patient's review from today.   Objective:   Physical Exam:  There were no vitals taken for this visit.  Physical Exam Constitutional:      General: She is not in acute distress. Musculoskeletal:        General: No deformity.  Neurological:     Mental Status: She is alert and oriented to person, place, and time.     Coordination: Coordination normal.  Psychiatric:        Attention and Perception: Attention and perception normal. She does not perceive auditory or visual hallucinations.        Mood and Affect: Mood normal. Mood is not anxious or depressed. Affect is not labile, blunt, angry or inappropriate.        Speech: Speech normal.        Behavior: Behavior normal.        Thought Content: Thought content normal. Thought content is not paranoid or delusional. Thought content does not include homicidal or suicidal ideation. Thought content does not include homicidal or suicidal plan.        Cognition and Memory: Cognition and memory normal.        Judgment: Judgment normal.     Comments: Insight intact     Lab Review:     Component Value Date/Time   NA 134 (L) 12/12/2020 0427   K 4.5 12/12/2020 0427   CL 103 12/12/2020 0427   CO2 23 12/12/2020 0427   GLUCOSE 129 (H) 12/12/2020 0427   BUN 14 12/12/2020 0427   CREATININE 0.75 12/12/2020 0427   CALCIUM 8.2 (L) 12/12/2020 0427   PROT 6.9 12/12/2020 0427   ALBUMIN 3.4 (L) 12/12/2020 0427   AST 23 12/12/2020 0427   ALT 23 12/12/2020 0427   ALKPHOS 79 12/12/2020 0427   BILITOT 0.5 12/12/2020 0427   GFRNONAA >60 12/12/2020 0427       Component Value Date/Time   WBC 12.0 (H) 12/12/2020 0427   RBC 4.13 12/12/2020 0427   HGB 12.2 12/12/2020 0427   HCT 37.3 12/12/2020 0427   PLT 331 12/12/2020 0427   MCV 90.3 12/12/2020 0427   MCH 29.5 12/12/2020 0427   MCHC 32.7 12/12/2020 0427    RDW 12.4 12/12/2020 0427   LYMPHSABS 1.1 12/12/2020 0427   MONOABS 0.2 12/12/2020 0427   EOSABS 0.0 12/12/2020 0427   BASOSABS 0.0 12/12/2020 0427    No results found for: "POCLITH", "LITHIUM"   No results found for: "PHENYTOIN", "PHENOBARB", "VALPROATE", "CBMZ"   .res Assessment: Plan:    Plan:  PDMP reviewed   Prozac '40mg'$  daily Lamictal '150mg'$  daily Add Clonidine 0.'1mg'$  at hs Add Adderall '30mg'$  daily   RTC 3 months  Patient advised to contact office with any questions,  adverse effects, or acute worsening in signs and symptoms.  Discussed potential benefits, risks, and side effects of stimulants with patient to include increased heart rate, palpitations, insomnia, increased anxiety, increased irritability, or decreased appetite.  Instructed patient to contact office if experiencing any significant tolerability issues.  Counseled patient regarding potential benefits, risks, and side effects of Lamictal to include potential risk of Stevens-Johnson syndrome. Advised patient to stop taking Lamictal and contact office immediately if rash develops and to seek urgent medical attention if rash is severe and/or spreading quickly. There are no diagnoses linked to this encounter.   Please see After Visit Summary for patient specific instructions.  Future Appointments  Date Time Provider [place]  08/09/2021  2:30 PM Annitta Needs, NP [facility]  08/23/2021  8:30 AM [facility] PAT 2 [facility] None  11/27/2021  3:30 PM Scotece, Francesco Sor, RD [facility]    No orders of the defined types were placed in this encounter.   -------------------------------

## 2021-08-07 ENCOUNTER — Other Ambulatory Visit: Payer: Self-pay

## 2021-08-07 ENCOUNTER — Telehealth: Payer: Self-pay | Admitting: Adult Health

## 2021-08-07 MED ORDER — CLONIDINE HCL 0.2 MG PO TABS: 0.2000 mg | ORAL_TABLET | Freq: Every day | ORAL | 3 refills | Status: DC

## 2021-08-07 NOTE — Telephone Encounter (Signed)
Rx sent 

## 2021-08-07 NOTE — Telephone Encounter (Signed)
Pt LVM @ 9:30a.  She would like refill of Clonadine 0.'2mg'$  sent to [facility] in [place].  Next appt 10/23

## 2021-08-07 NOTE — Telephone Encounter (Signed)
Rx not on med list but note from 7/20 stated add clonidine 0.1 mg daily.Pt is asking for 0.2 mg.What is the correct dose to send?

## 2021-08-07 NOTE — Telephone Encounter (Signed)
Send the 0.'2mg'$  please.

## 2021-08-09 ENCOUNTER — Encounter: Payer: Commercial Managed Care - PPO | Admitting: Gastroenterology

## 2021-08-21 NOTE — Patient Instructions (Signed)
[name]  08/21/2021     '@PREFPERIOPPHARMACY'$ @   Your procedure is scheduled on  08/27/2021.   Report to [facility] at  0600 A.M.   Call this number if you have problems the morning of surgery:  [phone]   Remember:  Follow the diet and prep instructions given to you by the office.        Use your inhaler before you come and bring your rescue inhaler with you.     Take these medicines the morning of surgery with A SIP OF WATER                                 catapres, prozac.      Do not wear jewelry, make-up or nail polish.  Do not wear lotions, powders, or perfumes, or deodorant.  Do not shave 48 hours prior to surgery.  Men may shave face and neck.  Do not bring valuables to the hospital.  [facility] is not responsible for any belongings or valuables.  Contacts, dentures or bridgework may not be worn into surgery.  Leave your suitcase in the car.  After surgery it may be brought to your room.  For patients admitted to the hospital, discharge time will be determined by your treatment team.  Patients discharged the day of surgery will not be allowed to drive home and must have someone with them for 24 hours.    Special instructions:   DO NOT smoke tobacco or vape for 24 hours before your procedure.  Please read over the following fact sheets that you were given. Anesthesia Post-op Instructions and Care and Recovery After Surgery      Colonoscopy, Adult, Care After The following information offers guidance on how to care for yourself after your procedure. Your health care provider may also give you more specific instructions. If you have problems or questions, contact your health care provider. What can I expect after the procedure? After the procedure, it is common to have: A small amount of blood in your stool for 24 hours after the procedure. Some gas. Mild cramping or bloating of your abdomen. Follow these instructions at home: Eating and  drinking  Drink enough fluid to keep your urine pale yellow. Follow instructions from your health care provider about eating or drinking restrictions. Resume your normal diet as told by your health care provider. Avoid heavy or fried foods that are hard to digest. Activity Rest as told by your health care provider. Avoid sitting for a long time without moving. Get up to take short walks every 1-2 hours. This is important to improve blood flow and breathing. Ask for help if you feel weak or unsteady. Return to your normal activities as told by your health care provider. Ask your health care provider what activities are safe for you. Managing cramping and bloating  Try walking around when you have cramps or feel bloated. If directed, apply heat to your abdomen as told by your health care provider. Use the heat source that your health care provider recommends, such as a moist heat pack or a heating pad. Place a towel between your skin and the heat source. Leave the heat on for 20-30 minutes. Remove the heat if your skin turns bright red. This is especially important if you are unable to feel pain, heat, or cold. You have a greater risk of getting burned. General  instructions If you were given a sedative during the procedure, it can affect you for several hours. Do not drive or operate machinery until your health care provider says that it is safe. For the first 24 hours after the procedure: Do not sign important documents. Do not drink alcohol. Do your regular daily activities at a slower pace than normal. Eat soft foods that are easy to digest. Take over-the-counter and prescription medicines only as told by your health care provider. Keep all follow-up visits. This is important. Contact a health care provider if: You have blood in your stool 2-3 days after the procedure. Get help right away if: You have more than a small spotting of blood in your stool. You have large blood clots in your  stool. You have swelling of your abdomen. You have nausea or vomiting. You have a fever. You have increasing pain in your abdomen that is not relieved with medicine. These symptoms may be an emergency. Get help right away. Call 911. Do not wait to see if the symptoms will go away. Do not drive yourself to the hospital. Summary After the procedure, it is common to have a small amount of blood in your stool. You may also have mild cramping and bloating of your abdomen. If you were given a sedative during the procedure, it can affect you for several hours. Do not drive or operate machinery until your health care provider says that it is safe. Get help right away if you have a lot of blood in your stool, nausea or vomiting, a fever, or increased pain in your abdomen. This information is not intended to replace advice given to you by your health care provider. Make sure you discuss any questions you have with your health care provider. Document Revised: 08/23/2020 Document Reviewed: 08/23/2020 Elsevier Patient Education  [place] This sheet gives you information about how to care for yourself after your procedure. Your health care provider may also give you more specific instructions. If you have problems or questions, contact your health care provider. What can I expect after the procedure? After the procedure, it is common to have: Tiredness. Forgetfulness about what happened after the procedure. Impaired judgment for important decisions. Nausea or vomiting. Some difficulty with balance. Follow these instructions at home: For the time period you were told by your health care provider:     Rest as needed. Do not participate in activities where you could fall or become injured. Do not drive or use machinery. Do not drink alcohol. Do not take sleeping pills or medicines that cause drowsiness. Do not make important decisions or sign legal  documents. Do not take care of children on your own. Eating and drinking Follow the diet that is recommended by your health care provider. Drink enough fluid to keep your urine pale yellow. If you vomit: Drink water, juice, or soup when you can drink without vomiting. Make sure you have little or no nausea before eating solid foods. General instructions Have a responsible adult stay with you for the time you are told. It is important to have someone help care for you until you are awake and alert. Take over-the-counter and prescription medicines only as told by your health care provider. If you have sleep apnea, surgery and certain medicines can increase your risk for breathing problems. Follow instructions from your health care provider about wearing your sleep device: Anytime you are sleeping, including during daytime naps. While taking prescription  pain medicines, sleeping medicines, or medicines that make you drowsy. Avoid smoking. Keep all follow-up visits as told by your health care provider. This is important. Contact a health care provider if: You keep feeling nauseous or you keep vomiting. You feel light-headed. You are still sleepy or having trouble with balance after 24 hours. You develop a rash. You have a fever. You have redness or swelling around the IV site. Get help right away if: You have trouble breathing. You have new-onset confusion at home. Summary For several hours after your procedure, you may feel tired. You may also be forgetful and have poor judgment. Have a responsible adult stay with you for the time you are told. It is important to have someone help care for you until you are awake and alert. Rest as told. Do not drive or operate machinery. Do not drink alcohol or take sleeping pills. Get help right away if you have trouble breathing, or if you suddenly become confused. This information is not intended to replace advice given to you by your health care  provider. Make sure you discuss any questions you have with your health care provider. Document Revised: 12/05/2020 Document Reviewed: 12/03/2018 Elsevier Patient Education  [place].

## 2021-08-23 ENCOUNTER — Encounter (HOSPITAL_COMMUNITY)
Admission: RE | Admit: 2021-08-23 | Discharge: 2021-08-23 | Disposition: A | Discharge status: Home / Self Care | Payer: Commercial Managed Care - PPO | Source: Ambulatory Visit | Attending: Internal Medicine | Admitting: Internal Medicine

## 2021-08-23 ENCOUNTER — Encounter (HOSPITAL_COMMUNITY): Payer: Self-pay

## 2021-08-23 DIAGNOSIS — Z01810 Encounter for preprocedural cardiovascular examination: Secondary | ICD-10-CM | POA: Diagnosis present

## 2021-08-23 DIAGNOSIS — E669 Obesity, unspecified: Secondary | ICD-10-CM | POA: Insufficient documentation

## 2021-08-27 ENCOUNTER — Ambulatory Visit (HOSPITAL_COMMUNITY): Payer: Commercial Managed Care - PPO | Admitting: Anesthesiology

## 2021-08-27 ENCOUNTER — Encounter (HOSPITAL_COMMUNITY)
Admission: RE | Disposition: A | Discharge status: Home / Self Care | Payer: Self-pay | Source: Home / Self Care | Attending: Internal Medicine

## 2021-08-27 ENCOUNTER — Ambulatory Visit (HOSPITAL_COMMUNITY)
Admission: RE | Admit: 2021-08-27 | Discharge: 2021-08-27 | Disposition: A | Discharge status: Home / Self Care | Payer: Commercial Managed Care - PPO | Attending: Internal Medicine | Admitting: Internal Medicine

## 2021-08-27 ENCOUNTER — Ambulatory Visit (HOSPITAL_BASED_OUTPATIENT_CLINIC_OR_DEPARTMENT_OTHER): Payer: Commercial Managed Care - PPO | Admitting: Anesthesiology

## 2021-08-27 ENCOUNTER — Encounter (HOSPITAL_COMMUNITY): Payer: Self-pay

## 2021-08-27 DIAGNOSIS — Z1211 Encounter for screening for malignant neoplasm of colon: Secondary | ICD-10-CM

## 2021-08-27 DIAGNOSIS — F319 Bipolar disorder, unspecified: Secondary | ICD-10-CM | POA: Insufficient documentation

## 2021-08-27 DIAGNOSIS — Z1212 Encounter for screening for malignant neoplasm of rectum: Secondary | ICD-10-CM

## 2021-08-27 DIAGNOSIS — J45909 Unspecified asthma, uncomplicated: Secondary | ICD-10-CM | POA: Diagnosis not present

## 2021-08-27 DIAGNOSIS — K635 Polyp of colon: Secondary | ICD-10-CM | POA: Diagnosis not present

## 2021-08-27 DIAGNOSIS — F909 Attention-deficit hyperactivity disorder, unspecified type: Secondary | ICD-10-CM | POA: Insufficient documentation

## 2021-08-27 DIAGNOSIS — K648 Other hemorrhoids: Secondary | ICD-10-CM | POA: Diagnosis not present

## 2021-08-27 DIAGNOSIS — F419 Anxiety disorder, unspecified: Secondary | ICD-10-CM | POA: Diagnosis not present

## 2021-08-27 HISTORY — PX: COLONOSCOPY WITH PROPOFOL: SHX5780

## 2021-08-27 HISTORY — PX: POLYPECTOMY: SHX5525

## 2021-08-27 SURGERY — COLONOSCOPY WITH PROPOFOL
Anesthesia: General

## 2021-08-27 MED ORDER — STERILE WATER FOR IRRIGATION IR SOLN
Status: DC | PRN
  Administered 2021-08-27: 100 mL

## 2021-08-27 MED ORDER — ONDANSETRON HCL 4 MG/2ML IJ SOLN
INTRAMUSCULAR | Status: DC | PRN
  Administered 2021-08-27: 4 mg via INTRAVENOUS

## 2021-08-27 MED ORDER — LACTATED RINGERS IV SOLN: INTRAVENOUS | Status: DC

## 2021-08-27 MED ORDER — PROPOFOL 10 MG/ML IV BOLUS
INTRAVENOUS | Status: DC | PRN
  Administered 2021-08-27 (×5): 50 mg via INTRAVENOUS
  Administered 2021-08-27: 100 mg via INTRAVENOUS
  Administered 2021-08-27 (×3): 50 mg via INTRAVENOUS

## 2021-08-27 NOTE — Transfer of Care (Signed)
Immediate Anesthesia Transfer of Care Note  Patient: [name]  Procedure(s) Performed: COLONOSCOPY WITH PROPOFOL POLYPECTOMY  Patient Location: Short Stay  Anesthesia Type:General  Level of Consciousness: awake  Airway & Oxygen Therapy: Patient Spontanous Breathing  Post-op Assessment: Report given to RN and Post -op Vital signs reviewed and stable  Post vital signs: Reviewed and stable  Last Vitals:  Vitals Value Taken Time  BP 87/48 08/27/21 0757  Temp 36.4 C 08/27/21 0757  Pulse 73 08/27/21 0757  Resp 17 08/27/21 0757  SpO2 97 % 08/27/21 0757    Last Pain:  Vitals:   08/27/21 0757  TempSrc: Oral  PainSc: 0-No pain      Patients Stated Pain Goal: 5 (75/79/72 8206)  Complications: No notable events documented.

## 2021-08-27 NOTE — H&P (Signed)
Primary Care Physician:  Celene Squibb, MD Primary Gastroenterologist:  Dr. Abbey Chatters  Pre-Procedure History & Physical: HPI:  [name] is a 46 y.o. female is here for first ever colonoscopy for colon cancer screening purposes.  Patient denies any family history of colorectal cancer.  No melena. Some issues with hemorrhoids.  No abdominal pain or unintentional weight loss.  No change in bowel habits.  Overall feels well from a GI standpoint.  Past Medical History:  Diagnosis Date   Anxiety    Arthritis    Bilateral knees and bilateral ankles   Asthma    Autism    Bipolar disorder (HCC)    Depression    Family history of adverse reaction to anesthesia    Post op nausea and vomiting  mom and brother   Family history of breast cancer    Family history of kidney cancer    Family history of prostate cancer    GERD (gastroesophageal reflux disease)    Headache    Migraines   History of hiatal hernia    History of shoulder surgery    Hypothyroidism    Obesity    Plantar fasciitis 04/26/2013   Pneumonia    PONV (postoperative nausea and vomiting)    Post op nausea and vomiting    Past Surgical History:  Procedure Laterality Date   ABDOMINAL HYSTERECTOMY     Bladder repair during hysterectomy   APPENDECTOMY     BREAST DUCTAL SYSTEM EXCISION Left 05/18/2020   Procedure: EXCISION DUCTAL SYSTEM LEFT BREAST;  Surgeon: Rolm Bookbinder, MD;  Location: [facility];  Service: General;  Laterality: Left;   CHOLECYSTECTOMY     DILATION AND CURETTAGE OF UTERUS     FRACTURE SURGERY     HIATAL HERNIA REPAIR N/A 12/11/2020   Procedure: HERNIA REPAIR HIATAL;  Surgeon: Clovis Riley, MD;  Location: [facility];  Service: General;  Laterality: N/A;   LAPAROSCOPIC GASTRIC SLEEVE RESECTION N/A 12/11/2020   Procedure: LAPAROSCOPIC GASTRIC SLEEVE RESECTION;  Surgeon: Clovis Riley, MD;  Location: [facility];  Service: General;  Laterality: N/A;   LEG SURGERY Right    RADIOACTIVE SEED GUIDED EXCISIONAL  BREAST BIOPSY Left 05/18/2020   Procedure: RADIOACTIVE SEED GUIDED EXCISIONAL BREAST MASS;  Surgeon: Rolm Bookbinder, MD;  Location: [facility];  Service: General;  Laterality: Left;   SHOULDER SURGERY Right    UPPER GI ENDOSCOPY N/A 12/11/2020   Procedure: UPPER GI ENDOSCOPY;  Surgeon: Clovis Riley, MD;  Location: [facility];  Service: General;  Laterality: N/A;    Prior to Admission medications   Medication Sig Start Date End Date Taking? Authorizing Provider  acetaminophen (TYLENOL) 500 MG tablet Take 1,000 mg by mouth every 6 (six) hours as needed for moderate pain.    [provider]  albuterol (VENTOLIN HFA) 108 (90 Base) MCG/ACT inhaler Inhale 1-2 puffs into the lungs every 6 (six) hours as needed for wheezing or shortness of breath. 12/26/18   [provider]  amphetamine-dextroamphetamine (ADDERALL) 30 MG tablet Take 1 tablet by mouth 2 (two) times daily. 08/02/21   Mozingo, Berdie Ogren, NP  amphetamine-dextroamphetamine (ADDERALL) 30 MG tablet Take one tablet twice daily. 08/30/21   Mozingo, Berdie Ogren, NP  amphetamine-dextroamphetamine (ADDERALL) 30 MG tablet Take 1 tablet by mouth 2 (two) times daily. 09/27/21   Mozingo, Berdie Ogren, NP  cloNIDine (CATAPRES) 0.2 MG tablet Take 1 tablet (0.2 mg total) by mouth daily. 08/07/21   Mozingo, Berdie Ogren, NP  FLUoxetine (PROZAC) 40  MG capsule TAKE 1 CAPSULE BY MOUTH DAILY 08/02/21   Mozingo, Berdie Ogren, NP  lamoTRIgine (LAMICTAL) 150 MG tablet Take one tablet at bedtime. 08/02/21   Mozingo, Berdie Ogren, NP  Multiple Vitamin (MULTIVITAMIN WITH MINERALS) TABS tablet Take 1 tablet by mouth daily.    [provider]  Sodium Sulfate-Mag Sulfate-KCl ([place]) [phone] MG TABS As directed Patient not taking: Reported on 07/26/2021 07/13/21   Eloise Harman, DO    Allergies as of 07/12/2021 - Review Complete 07/12/2021  Allergen Reaction Noted   Codeine Nausea And Vomiting 10/13/2010   Other Nausea  Only 05/05/2020   Wound dressing adhesive Rash 05/05/2020    Family History  Problem Relation Age of Onset   Cervical cancer Mother    Colon polyps Mother    Diabetes Father    Hypertension Father    Arthritis Maternal Grandmother    Breast cancer Maternal Grandmother        dx in her 2s   Prostate cancer Maternal Grandfather    Lung cancer Paternal Grandmother    Lung cancer Paternal Grandfather    Anxiety disorder Son    Asperger's syndrome Son    OCD Son    ADD / ADHD Son    Depression Son    Kidney cancer Maternal Aunt        dx in her 74s   Cervical cancer Maternal Aunt    Breast cancer Paternal Aunt        dx > 72   Breast cancer Other        PGMs sister d. 36   Breast cancer Other        PGMs sister d. 29   Colon cancer Neg Hx     Social History   Socioeconomic History   Marital status: Married    Spouse name: Not on file   Number of children: Not on file   Years of education: Not on file   Highest education level: Not on file  Occupational History   Not on file  Tobacco Use   Smoking status: Never   Smokeless tobacco: Never  Vaping Use   Vaping Use: Never used  Substance and Sexual Activity   Alcohol use: Not Currently   Drug use: No   Sexual activity: Yes    Birth control/protection: Surgical    Comment: hyst  Other Topics Concern   Not on file  Social History Narrative   Photographer, and works at [employer].    Social Determinants of Health   Financial Resource Strain: Not on file  Food Insecurity: Not on file  Transportation Needs: Not on file  Physical Activity: Not on file  Stress: Not on file  Social Connections: Not on file  Intimate Partner Violence: Not on file    Review of Systems: See HPI, otherwise negative ROS  Physical Exam: Vital signs in last 24 hours: Temp:  [97.6 F (36.4 C)] 97.6 F (36.4 C) (08/14 0647) Pulse Rate:  [70] 70 (08/14 0647) Resp:  [18] 18 (08/14 0647) BP: (96)/(66) 96/66 (08/14 0647) SpO2:   [100 %] 100 % (08/14 0647)   General:   Alert,  Well-developed, well-nourished, pleasant and cooperative in NAD Head:  Normocephalic and atraumatic. Eyes:  Sclera clear, no icterus.   Conjunctiva pink. Ears:  Normal auditory acuity. Nose:  No deformity, discharge,  or lesions. Mouth:  No deformity or lesions, dentition normal. Neck:  Supple; no masses or thyromegaly. Lungs:  Clear throughout to auscultation.  No wheezes, crackles, or rhonchi. No acute distress. Heart:  Regular rate and rhythm; no murmurs, clicks, rubs,  or gallops. Abdomen:  Soft, nontender and nondistended. No masses, hepatosplenomegaly or hernias noted. Normal bowel sounds, without guarding, and without rebound.   Msk:  Symmetrical without gross deformities. Normal posture. Extremities:  Without clubbing or edema. Neurologic:  Alert and  oriented x4;  grossly normal neurologically. Skin:  Intact without significant lesions or rashes. Cervical Nodes:  No significant cervical adenopathy. Psych:  Alert and cooperative. Normal mood and affect.  Impression/Plan: [name] is here for a colonoscopy to be performed for colon cancer screening purposes.  The risks of the procedure including infection, bleed, or perforation as well as benefits, limitations, alternatives and imponderables have been reviewed with the patient. Questions have been answered. All parties agreeable.

## 2021-08-27 NOTE — Anesthesia Postprocedure Evaluation (Signed)
Anesthesia Post Note  Patient: [name]  Procedure(s) Performed: COLONOSCOPY WITH PROPOFOL POLYPECTOMY  Patient location during evaluation: Phase II Anesthesia Type: General Level of consciousness: awake and alert and oriented Pain management: pain level controlled Vital Signs Assessment: post-procedure vital signs reviewed and stable Respiratory status: spontaneous breathing, nonlabored ventilation and respiratory function stable Cardiovascular status: blood pressure returned to baseline and stable Postop Assessment: no apparent nausea or vomiting Anesthetic complications: no   No notable events documented.   Last Vitals:  Vitals:   08/27/21 0757 08/27/21 0759  BP: (!) 87/48 (!) 92/53  Pulse: 73   Resp: 17   Temp: 36.4 C   SpO2: 97%     Last Pain:  Vitals:   08/27/21 0757  TempSrc: Oral  PainSc: 0-No pain                 Zhara Gieske C Arianna Delsanto

## 2021-08-27 NOTE — Op Note (Signed)
[facility] Patient Name: [name] Procedure Date: 08/27/2021 7:07 AM MRN: [id number] Date of Birth: 1975-03-24 Attending MD: Elon Alas. Abbey Chatters DO CSN: [id number] Age: 46 Admit Type: Outpatient Procedure:                Colonoscopy Indications:              Screening for colorectal malignant neoplasm Providers:                Elon Alas. Abbey Chatters, DO, Charlsie Quest. Theda Sers RN, RN,                            Everardo Pacific, Aram Candela Referring MD:              Medicines:                See the Anesthesia note for documentation of the                            administered medications Complications:            No immediate complications. Estimated Blood Loss:     Estimated blood loss was minimal. Procedure:                Pre-Anesthesia Assessment:                           - The anesthesia plan was to use monitored                            anesthesia care (MAC).                           After obtaining informed consent, the colonoscope                            was passed under direct vision. Throughout the                            procedure, the patient's blood pressure, pulse, and                            oxygen saturations were monitored continuously. The                            [id number]-HQ190L ([id number]) scope was introduced through                            the anus and advanced to the the cecum, identified                            by appendiceal orifice and ileocecal valve. The                            colonoscopy was performed without difficulty. The                            patient tolerated the  procedure well. The quality                            of the bowel preparation was evaluated using the                            BBPS [facility] Bowel Preparation Scale) with scores                            of: Right Colon = 3, Transverse Colon = 3 and Left                            Colon = 3 (entire mucosa seen well with no residual                             staining, small fragments of stool or opaque                            liquid). The total BBPS score equals 9. Scope In: 7:34:01 AM Scope Out: 7:53:53 AM Scope Withdrawal Time: 0 hours 13 minutes 13 seconds  Total Procedure Duration: 0 hours 19 minutes 52 seconds  Findings:      Hemorrhoids were found on perianal exam.      Non-bleeding internal hemorrhoids were found during retroflexion.      A 6 mm polyp was found in the sigmoid colon. The polyp was sessile. The       polyp was removed with a cold snare. Resection and retrieval were       complete.      The exam was otherwise without abnormality. Impression:               - Hemorrhoids found on perianal exam.                           - Non-bleeding internal hemorrhoids.                           - One 6 mm polyp in the sigmoid colon, removed with                            a cold snare. Resected and retrieved.                           - The examination was otherwise normal. Moderate Sedation:      Per Anesthesia Care Recommendation:           - Patient has a contact number available for                            emergencies. The signs and symptoms of potential                            delayed complications were discussed with the  patient. Return to normal activities tomorrow.                            Written discharge instructions were provided to the                            patient.                           - Resume previous diet.                           - Continue present medications.                           - Await pathology results.                           - Repeat colonoscopy in 5-10 years for surveillance.                           - Return to GI clinic at the next available                            appointment with Roseanne Kaufman for hemorrhoid banding Procedure Code(s):        --- Professional ---                           [id number], Colonoscopy, flexible; with removal of                             tumor(s), polyp(s), or other lesion(s) by snare                            technique Diagnosis Code(s):        --- Professional ---                           Z12.11, Encounter for screening for malignant                            neoplasm of colon                           K64.8, Other hemorrhoids                           K63.5, Polyp of colon CPT copyright 2019 American Medical Association. All rights reserved. The codes documented in this report are preliminary and upon coder review may  be revised to meet current compliance requirements. Elon Alas. Abbey Chatters, DO [place] Abbey Chatters, DO 08/27/2021 7:56:36 AM This report has been signed electronically. Number of Addenda: 0

## 2021-08-27 NOTE — Discharge Instructions (Signed)
  Colonoscopy Discharge Instructions  Read the instructions outlined below and refer to this sheet in the next few weeks. These discharge instructions provide you with general information on caring for yourself after you leave the hospital. Your doctor may also give you specific instructions. While your treatment has been planned according to the most current medical practices available, unavoidable complications occasionally occur.   ACTIVITY You may resume your regular activity, but move at a slower pace for the next 24 hours.  Take frequent rest periods for the next 24 hours.  Walking will help get rid of the air and reduce the bloated feeling in your belly (abdomen).  No driving for 24 hours (because of the medicine (anesthesia) used during the test).   Do not sign any important legal documents or operate any machinery for 24 hours (because of the anesthesia used during the test).  NUTRITION Drink plenty of fluids.  You may resume your normal diet as instructed by your doctor.  Begin with a light meal and progress to your normal diet. Heavy or fried foods are harder to digest and may make you feel sick to your stomach (nauseated).  Avoid alcoholic beverages for 24 hours or as instructed.  MEDICATIONS You may resume your normal medications unless your doctor tells you otherwise.  WHAT YOU CAN EXPECT TODAY Some feelings of bloating in the abdomen.  Passage of more gas than usual.  Spotting of blood in your stool or on the toilet paper.  IF YOU HAD POLYPS REMOVED DURING THE COLONOSCOPY: No aspirin products for 7 days or as instructed.  No alcohol for 7 days or as instructed.  Eat a soft diet for the next 24 hours.  FINDING OUT THE RESULTS OF YOUR TEST Not all test results are available during your visit. If your test results are not back during the visit, make an appointment with your caregiver to find out the results. Do not assume everything is normal if you have not heard from your  caregiver or the medical facility. It is important for you to follow up on all of your test results.  SEEK IMMEDIATE MEDICAL ATTENTION IF: You have more than a spotting of blood in your stool.  Your belly is swollen (abdominal distention).  You are nauseated or vomiting.  You have a temperature over 101.  You have abdominal pain or discomfort that is severe or gets worse throughout the day.   Your colonoscopy revealed 1 polyp(s) which I removed successfully. Await pathology results, my office will contact you. I recommend repeating colonoscopy in 5-10 years for surveillance purposes depending on pathology results.  Follow up with Roseanne Kaufman for hemorrhoid banding.    I hope you have a great rest of your week!  Elon Alas. Abbey Chatters, D.O. Gastroenterology and Hepatology [facility]

## 2021-08-27 NOTE — Anesthesia Preprocedure Evaluation (Signed)
Anesthesia Evaluation  Patient identified by MRN, date of birth, ID band Patient awake    Reviewed: Allergy & Precautions, NPO status , Patient's Chart, lab work & pertinent test results  History of Anesthesia Complications (+) PONV, Family history of anesthesia reaction and history of anesthetic complications  Airway Mallampati: I  TM Distance: >3 FB Neck ROM: Full    Dental  (+) Dental Advisory Given, Teeth Intact   Pulmonary asthma , pneumonia, neg COPD,  COPD inhaler,    Pulmonary exam normal breath sounds clear to auscultation       Cardiovascular negative cardio ROS Normal cardiovascular exam Rhythm:Regular Rate:Normal     Neuro/Psych  Headaches, PSYCHIATRIC DISORDERS Anxiety Depression Bipolar Disorder    GI/Hepatic Neg liver ROS, hiatal hernia, GERD  Controlled,  Endo/Other  Hypothyroidism (not on meds) Morbid obesity  Renal/GU negative Renal ROS  negative genitourinary   Musculoskeletal  (+) Arthritis , Osteoarthritis,    Abdominal   Peds  (+) ADHD Hematology negative hematology ROS (+)   Anesthesia Other Findings   Reproductive/Obstetrics negative OB ROS                            Anesthesia Physical Anesthesia Plan  ASA: 2  Anesthesia Plan: General   Post-op Pain Management: Minimal or no pain anticipated   Induction: Intravenous  PONV Risk Score and Plan: Propofol infusion  Airway Management Planned: Nasal Cannula and Natural Airway  Additional Equipment:   Intra-op Plan:   Post-operative Plan:   Informed Consent: I have reviewed the patients History and Physical, chart, labs and discussed the procedure including the risks, benefits and alternatives for the proposed anesthesia with the patient or authorized representative who has indicated his/her understanding and acceptance.     Dental advisory given  Plan Discussed with: CRNA and Surgeon  Anesthesia  Plan Comments:        Anesthesia Quick Evaluation

## 2021-08-28 LAB — SURGICAL PATHOLOGY

## 2021-08-30 ENCOUNTER — Encounter (HOSPITAL_COMMUNITY): Payer: Self-pay | Admitting: Internal Medicine

## 2021-09-05 ENCOUNTER — Ambulatory Visit (HOSPITAL_COMMUNITY)
Admission: RE | Admit: 2021-09-05 | Discharge: 2021-09-05 | Disposition: A | Discharge status: Home / Self Care | Payer: Commercial Managed Care - PPO | Source: Ambulatory Visit | Attending: Family Medicine | Admitting: Family Medicine

## 2021-09-05 DIAGNOSIS — Z1231 Encounter for screening mammogram for malignant neoplasm of breast: Secondary | ICD-10-CM | POA: Diagnosis present

## 2021-11-05 ENCOUNTER — Ambulatory Visit (INDEPENDENT_AMBULATORY_CARE_PROVIDER_SITE_OTHER): Payer: Commercial Managed Care - PPO | Admitting: Adult Health

## 2021-11-05 ENCOUNTER — Encounter: Payer: Self-pay | Admitting: Adult Health

## 2021-11-05 DIAGNOSIS — F902 Attention-deficit hyperactivity disorder, combined type: Secondary | ICD-10-CM | POA: Diagnosis not present

## 2021-11-05 DIAGNOSIS — G4700 Insomnia, unspecified: Secondary | ICD-10-CM

## 2021-11-05 DIAGNOSIS — F331 Major depressive disorder, recurrent, moderate: Secondary | ICD-10-CM | POA: Diagnosis not present

## 2021-11-05 DIAGNOSIS — F411 Generalized anxiety disorder: Secondary | ICD-10-CM | POA: Diagnosis not present

## 2021-11-05 MED ORDER — AMPHETAMINE-DEXTROAMPHETAMINE 30 MG PO TABS: 30.0000 mg | ORAL_TABLET | Freq: Two times a day (BID) | ORAL | 0 refills | Status: DC

## 2021-11-05 MED ORDER — CLONIDINE HCL 0.3 MG PO TABS: 0.3000 mg | ORAL_TABLET | Freq: Every day | ORAL | 5 refills | Status: DC

## 2021-11-05 MED ORDER — FLUOXETINE HCL 40 MG PO CAPS: ORAL_CAPSULE | ORAL | 5 refills | Status: DC

## 2021-11-05 MED ORDER — AMPHETAMINE-DEXTROAMPHETAMINE 30 MG PO TABS: ORAL_TABLET | ORAL | 0 refills | Status: DC

## 2021-11-05 MED ORDER — LAMOTRIGINE 150 MG PO TABS: ORAL_TABLET | ORAL | 5 refills | Status: DC

## 2021-11-05 NOTE — Progress Notes (Signed)
[name] [id number] [birth year] 46 y.o.  Subjective:   Patient ID:  [name] is a 46 y.o. (DOB 1975/12/29) female.  Chief Complaint: No chief complaint on file.   HPI [name] presents to the office today for follow-up of ADHD, GAD, MDD and insomnia.   Describes mood today as "ok". Pleasant. Mood symptoms - denies depression and irritability. Feels anxious - "more situational". Denies recent panic attacks. Mood is consistent. Stating "I'm doing alright". Feels like medications are helpful. She and family doing well. Taking medications as prescribed.  Energy levels low. Active, has a regular exercise routine.  Enjoys some usual interests and activities. Married. Lives husband of 80 years and 3 sons. Spending time with family. Appetite varies. Weight loss 209 pounds from 309 pounds.  Sleeping well most nights. Averages 8 hours.  Focus and concentration improved. Completing tasks. Managing aspects of household. 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  Denies SI or HI.  Denies AH or VH.  Previous medications: Seroquel - tremors, Wellbutrin  PHQ2-9    Flowsheet Row Nutrition [facility]/18/[facility] Visit from 07/29/2016 in [facility]  PHQ-2 Total Score 0 6  PHQ-9 Total Score -- 21      Flowsheet Row Pre-Admission Testing 61 from 08/23/2021 in [facility] from 12/06/2020 in [facility] [place] 60 from 12/04/2020 in [place] No Risk No Risk No Risk        Review of Systems:  Review of Systems  Musculoskeletal:  Negative for gait problem.  Neurological:  Negative for tremors.  Psychiatric/Behavioral:         Please refer to HPI    Medications: I have reviewed the patient's current medications.  Current Outpatient Medications  Medication Sig Dispense Refill    acetaminophen (TYLENOL) 500 MG tablet Take 1,000 mg by mouth every 6 (six) hours as needed for moderate pain.     albuterol (VENTOLIN HFA) 108 (90 Base) MCG/ACT inhaler Inhale 1-2 puffs into the lungs every 6 (six) hours as needed for wheezing or shortness of breath.     amphetamine-dextroamphetamine (ADDERALL) 30 MG tablet Take 1 tablet by mouth 2 (two) times daily. 60 tablet 0   [START ON 12/03/2021] amphetamine-dextroamphetamine (ADDERALL) 30 MG tablet Take 1 tablet by mouth 2 (two) times daily. 60 tablet 0   [START ON 12/31/2021] amphetamine-dextroamphetamine (ADDERALL) 30 MG tablet Take one tablet twice daily. 60 tablet 0   cloNIDine (CATAPRES) 0.3 MG tablet Take 1 tablet (0.3 mg total) by mouth daily. 30 tablet 5   FLUoxetine (PROZAC) 40 MG capsule TAKE 1 CAPSULE BY MOUTH DAILY 30 capsule 5   lamoTRIgine (LAMICTAL) 150 MG tablet Take one tablet at bedtime. 30 tablet 5   Multiple Vitamin (MULTIVITAMIN WITH MINERALS) TABS tablet Take 1 tablet by mouth daily.     No current facility-administered medications for this visit.    Medication Side Effects: None  Allergies:  Allergies  Allergen Reactions   Codeine Nausea And Vomiting   Other Nausea Only    Pt has severe nausea with anesthesia   Wound Dressing Adhesive Rash    Past Medical History:  Diagnosis Date   Anxiety    Arthritis    Bilateral knees and bilateral ankles   Asthma    Autism    Bipolar disorder ([place])    Depression    Family  history of adverse reaction to anesthesia    Post op nausea and vomiting  mom and brother   Family history of breast cancer    Family history of kidney cancer    Family history of prostate cancer    GERD (gastroesophageal reflux disease)    Headache    Migraines   History of hiatal hernia    History of shoulder surgery    Hypothyroidism    Obesity    Plantar fasciitis 04/26/2013   Pneumonia    PONV (postoperative nausea and vomiting)    Post op nausea and vomiting    Past Medical  History, Surgical history, Social history, and Family history were reviewed and updated as appropriate.   Please see review of systems for further details on the patient's review from today.   Objective:   Physical Exam:  There were no vitals taken for this visit.  Physical Exam Constitutional:      General: She is not in acute distress. Musculoskeletal:        General: No deformity.  Neurological:     Mental Status: She is alert and oriented to person, place, and time.     Coordination: Coordination normal.  Psychiatric:        Attention and Perception: Attention and perception normal. She does not perceive auditory or visual hallucinations.        Mood and Affect: Mood normal. Mood is not anxious or depressed. Affect is not labile, blunt, angry or inappropriate.        Speech: Speech normal.        Behavior: Behavior normal.        Thought Content: Thought content normal. Thought content is not paranoid or delusional. Thought content does not include homicidal or suicidal ideation. Thought content does not include homicidal or suicidal plan.        Cognition and Memory: Cognition and memory normal.        Judgment: Judgment normal.     Comments: Insight intact     Lab Review:     Component Value Date/Time   NA 134 (L) 12/12/2020 0427   K 4.5 12/12/2020 0427   CL 103 12/12/2020 0427   CO2 23 12/12/2020 0427   GLUCOSE 129 (H) 12/12/2020 0427   BUN 14 12/12/2020 0427   CREATININE 0.75 12/12/2020 0427   CALCIUM 8.2 (L) 12/12/2020 0427   PROT 6.9 12/12/2020 0427   ALBUMIN 3.4 (L) 12/12/2020 0427   AST 23 12/12/2020 0427   ALT 23 12/12/2020 0427   ALKPHOS 79 12/12/2020 0427   BILITOT 0.5 12/12/2020 0427   GFRNONAA >60 12/12/2020 0427       Component Value Date/Time   WBC 12.0 (H) 12/12/2020 0427   RBC 4.13 12/12/2020 0427   HGB 12.2 12/12/2020 0427   HCT 37.3 12/12/2020 0427   PLT 331 12/12/2020 0427   MCV 90.3 12/12/2020 0427   MCH 29.5 12/12/2020 0427   MCHC  32.7 12/12/2020 0427   RDW 12.4 12/12/2020 0427   LYMPHSABS 1.1 12/12/2020 0427   MONOABS 0.2 12/12/2020 0427   EOSABS 0.0 12/12/2020 0427   BASOSABS 0.0 12/12/2020 0427    No results found for: "POCLITH", "LITHIUM"   No results found for: "PHENYTOIN", "PHENOBARB", "VALPROATE", "CBMZ"   .res Assessment: Plan:    Plan:  PDMP reviewed   Prozac '40mg'$  daily Lamictal '150mg'$  daily Clonidine 0.'2mg'$  to 0.'3mg'$  at hs Adderall '30mg'$  daily   RTC 3 months  Patient advised to contact office with  any questions, adverse effects, or acute worsening in signs and symptoms.  Discussed potential benefits, risks, and side effects of stimulants with patient to include increased heart rate, palpitations, insomnia, increased anxiety, increased irritability, or decreased appetite.  Instructed patient to contact office if experiencing any significant tolerability issues.  Counseled patient regarding potential benefits, risks, and side effects of Lamictal to include potential risk of Stevens-Johnson syndrome. Advised patient to stop taking Lamictal and contact office immediately if rash develops and to seek urgent medical attention if rash is severe and/or spreading quickly.  Diagnoses and all orders for this visit:  Generalized anxiety disorder  Major depressive disorder, recurrent episode, moderate (HCC) -     FLUoxetine (PROZAC) 40 MG capsule; TAKE 1 CAPSULE BY MOUTH DAILY -     lamoTRIgine (LAMICTAL) 150 MG tablet; Take one tablet at bedtime.  Attention deficit hyperactivity disorder (ADHD), combined type -     cloNIDine (CATAPRES) 0.3 MG tablet; Take 1 tablet (0.3 mg total) by mouth daily. -     amphetamine-dextroamphetamine (ADDERALL) 30 MG tablet; Take 1 tablet by mouth 2 (two) times daily. -     amphetamine-dextroamphetamine (ADDERALL) 30 MG tablet; Take 1 tablet by mouth 2 (two) times daily. -     amphetamine-dextroamphetamine (ADDERALL) 30 MG tablet; Take one tablet twice daily.  Insomnia,  unspecified type     Please see After Visit Summary for patient specific instructions.  Future Appointments  Date Time Provider Junction  11/27/2021  3:30 PM Scotece, Francesco Sor, RD [facility]    No orders of the defined types were placed in this encounter.   -------------------------------

## 2021-11-27 ENCOUNTER — Encounter: Payer: Commercial Managed Care - PPO | Attending: Internal Medicine | Admitting: Skilled Nursing Facility1

## 2021-11-27 ENCOUNTER — Encounter: Payer: Self-pay | Admitting: Skilled Nursing Facility1

## 2021-11-27 VITALS — Ht 63.0 in | Wt 211.8 lb

## 2021-11-27 DIAGNOSIS — E669 Obesity, unspecified: Secondary | ICD-10-CM | POA: Insufficient documentation

## 2021-11-27 NOTE — Progress Notes (Unsigned)
Follow-up visit:  Post-Operative 04/24/2021 Surgery   Surgery date: 12/11/2020 Surgery type: sleeve Start weight at [facility]: 307 (date: 04/18/2020) Weight today: 211.8 pounds   Body Composition Scale 12/26/2020 02/06/2021 04/24/2021 07/23/2021 11/27/2021  Current Body Weight 286 270.7 250.1 233.8 211.8  Total Body Fat % 48.6 47.5 45.8 44.2 41.6  Visceral Fat '19 18 16 15 13  '$ Fat-Free Mass % 51.3 52.4 54.1 55.7 58.3   Total Body Water % 40.1 40.7 41.5 42.3 43.6  Muscle-Mass lbs 31.6 31.5 31.1 30.9 30.7  BMI 50.3 47.5 43.9 41 37.1  Body Fat Displacement              Torso  lbs 86.4 79.7 71.2 64 54.6         Left Leg  lbs 17.2 15.9 14.2 12.8 10.9         Right Leg  lbs 17.2 15.9 14.2 12.8 10.9         Left Arm  lbs 8.6 7.9 7.1 6.4 5.4         Right Arm   lbs 8.6 7.9 7.1 6.4 5.4    Pt states her husband is a really good cook. 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   Pt states she is worried about the weight slowing what that means for her future.  Pt states she is really proud of her food choices. Pt states milk is not sitting right.  Pt states she did get the ninja smoothie.  Pt states she has been having reflux more often.  Pt states she his having fecal incontinence for the last few month: dietitian advised she log everything she puts into her mouth for the next week and if no patterns call your doctor.   24 hr recall: Breakfast: egg or protein bar or egg bite from [employer] or special k protein cereal Snack: protein shake or snack almond/cheese Lunch: eaten out salad or frozen meal protein bowl or just saltines crackers and cheese Snack: protein bar Dinner: hamburger steak + steamed vegetables or black beans or cabbage and seeds + hamburger steak Snack: sugar free vanilla pudding  Beverages: sugar free creamer latte, water + flavoring (3 days out of one packet), water  Fluid intake: 80 ounces   Medications: See  List Supplementation: multi and calcium states she has been falling off it  Using straws: no Drinking while eating: no Having you been chewing well: yes Chewing/swallowing difficulties: no Changes in vision: no Changes to mood/headaches: no Hair loss/Cahnges to skin/Changes to nails: no Any difficulty focusing or concentrating: no Sweating: no Dizziness/Lightheaded: no Palpitations: no  Carbonated beverages: no N/V/D/C/GAS: no Abdominal Pain: no Dumping syndrome: no  Recent physical activity:  animal shelter for work, recumbent bike 3 days a week 2-3 miles, some kind of cardio machine    Encouraged patient to honor their body's internal hunger and fullness cues.  Throughout the day, check in mentally and rate hunger. Stop eating when satisfied not full regardless of how much food is left on the plate.  Get more if still hungry 20-30 minutes later.  The key is to honor satisfaction so throughout the meal, rate fullness factor and stop when comfortably satisfied not physically full. The key is to honor hunger and fullness without any feelings of guilt or shame.  Pay attention to what the internal cues are, rather than any external factors. This will enhance the confidence you have  in listening to your own body and following those internal cues enabling you to increase how often you eat when you are hungry not out of appetite and stop when you are satisfied not full.  Encouraged pt to continue to eat balanced meals inclusive of non starchy vegetables 2 times a day 7 days a week Encouraged pt to choose lean protein sources: limiting beef, pork, sausage, hotdogs, and lunch meat Encourage pt to choose healthy fats such as plant based limiting animal fats Encouraged pt to continue to drink a minium 64 fluid ounces with half being plain water to satisfy proper hydration    Progress Towards Goal(s):  In Progress Teaching method utilized: Environmental health practitioner & Auditory  Demonstrated degree of understanding  via: Teach Back  Readiness Level: Action Barriers to learning/adherence to lifestyle change: none identified   Teaching Method Utilized:  Visual Auditory Hands on  Demonstrated degree of understanding via:  Teach Back   Monitoring/Evaluation:  Dietary intake, exercise, and body weight.

## 2022-02-05 ENCOUNTER — Encounter: Payer: Self-pay | Admitting: Adult Health

## 2022-02-05 ENCOUNTER — Ambulatory Visit (INDEPENDENT_AMBULATORY_CARE_PROVIDER_SITE_OTHER): Payer: Commercial Managed Care - PPO | Admitting: Adult Health

## 2022-02-05 DIAGNOSIS — F422 Mixed obsessional thoughts and acts: Secondary | ICD-10-CM

## 2022-02-05 DIAGNOSIS — G4700 Insomnia, unspecified: Secondary | ICD-10-CM

## 2022-02-05 DIAGNOSIS — F331 Major depressive disorder, recurrent, moderate: Secondary | ICD-10-CM

## 2022-02-05 DIAGNOSIS — F410 Panic disorder [episodic paroxysmal anxiety] without agoraphobia: Secondary | ICD-10-CM

## 2022-02-05 DIAGNOSIS — F902 Attention-deficit hyperactivity disorder, combined type: Secondary | ICD-10-CM

## 2022-02-05 DIAGNOSIS — F411 Generalized anxiety disorder: Secondary | ICD-10-CM

## 2022-02-05 MED ORDER — AMPHETAMINE-DEXTROAMPHETAMINE 30 MG PO TABS: 30.0000 mg | ORAL_TABLET | Freq: Two times a day (BID) | ORAL | 0 refills | Status: DC

## 2022-02-05 MED ORDER — AMPHETAMINE-DEXTROAMPHETAMINE 30 MG PO TABS: ORAL_TABLET | ORAL | 0 refills | Status: DC

## 2022-02-05 MED ORDER — CLONIDINE HCL 0.2 MG PO TABS: ORAL_TABLET | ORAL | 5 refills | Status: DC

## 2022-02-05 MED ORDER — LAMOTRIGINE 150 MG PO TABS: ORAL_TABLET | ORAL | 5 refills | Status: DC

## 2022-02-05 MED ORDER — FLUOXETINE HCL 40 MG PO CAPS: ORAL_CAPSULE | ORAL | 5 refills | Status: DC

## 2022-02-05 NOTE — Progress Notes (Signed)
[name] [id number] [birth year] 47 y.o.  Subjective:   Patient ID:  [name] is a 47 y.o. (DOB [birth year]) female.  Chief Complaint: No chief complaint on file.   HPI Margrete Delude presents to the office today for follow-up of ADHD, GAD, MDD and insomnia.   Describes mood today as "ok". Pleasant. Mood symptoms - denies depression, anxiety, and  irritability.  Denies recent panic attacks. Reports some worry, rumination, and over thinking. Mood is consistent. Stating "I'm doing pretty good". Feels like medications are helpful. She and family doing well. Taking medications as prescribed.  Energy levels stable. Active, has a regular exercise routine.  Enjoys some usual interests and activities. Married. Lives husband of 14 years and 3 sons. Spending time with family. Appetite varies. Weight loss - 110 pounds.  Sleeping well most nights. Averages 8 hours.  Focus and concentration improved. Completing tasks. Managing aspects of household. 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  Denies SI or HI.  Denies AH or VH. Denies self harm. Denies substance use.  Previous medications: Seroquel - tremors, Wellbutrin    PHQ2-9    Flowsheet Row Nutrition from 04/18/2020 in [place] at [facility] from 07/29/2016 in [facility]  PHQ-2 Total Score 0 6  PHQ-9 Total Score -- 21      Flowsheet Row Pre-Admission Testing 60 from 08/23/2021 in [place] [facility] from 12/06/2020 in [facility] [place] 60 from 12/04/2020 in [place] No Risk No Risk No Risk        Review of Systems:  Review of Systems  Musculoskeletal:  Negative for gait problem.  Neurological:  Negative for tremors.  Psychiatric/Behavioral:         Please refer to HPI    Medications: I have reviewed the patient's  current medications.  Current Outpatient Medications  Medication Sig Dispense Refill   acetaminophen (TYLENOL) 500 MG tablet Take 1,000 mg by mouth every 6 (six) hours as needed for moderate pain.     albuterol (VENTOLIN HFA) 108 (90 Base) MCG/ACT inhaler Inhale 1-2 puffs into the lungs every 6 (six) hours as needed for wheezing or shortness of breath.     amphetamine-dextroamphetamine (ADDERALL) 30 MG tablet Take 1 tablet by mouth 2 (two) times daily. 60 tablet 0   [START ON 03/05/2022] amphetamine-dextroamphetamine (ADDERALL) 30 MG tablet Take 1 tablet by mouth 2 (two) times daily. 60 tablet 0   [START ON 04/02/2022] amphetamine-dextroamphetamine (ADDERALL) 30 MG tablet Take one tablet twice daily. 60 tablet 0   cloNIDine (CATAPRES) 0.2 MG tablet Take one and 1/2 at bedtime. 45 tablet 5   FLUoxetine (PROZAC) 40 MG capsule TAKE 1 CAPSULE BY MOUTH DAILY 30 capsule 5   lamoTRIgine (LAMICTAL) 150 MG tablet Take one tablet at bedtime. 30 tablet 5   Multiple Vitamin (MULTIVITAMIN WITH MINERALS) TABS tablet Take 1 tablet by mouth daily.     No current facility-administered medications for this visit.    Medication Side Effects: None  Allergies:  Allergies  Allergen Reactions   Codeine Nausea And Vomiting   Other Nausea Only    Pt has severe nausea with anesthesia   Wound Dressing Adhesive Rash    Past Medical History:  Diagnosis Date   Anxiety    Arthritis    Bilateral knees and bilateral ankles   Asthma    Autism  Bipolar disorder ([place])    Depression    Family history of adverse reaction to anesthesia    Post op nausea and vomiting  mom and brother   Family history of breast cancer    Family history of kidney cancer    Family history of prostate cancer    GERD (gastroesophageal reflux disease)    Headache    Migraines   History of hiatal hernia    History of shoulder surgery    Hypothyroidism    Obesity    Plantar fasciitis 04/26/2013   Pneumonia    PONV (postoperative  nausea and vomiting)    Post op nausea and vomiting    Past Medical History, Surgical history, Social history, and Family history were reviewed and updated as appropriate.   Please see review of systems for further details on the patient's review from today.   Objective:   Physical Exam:  There were no vitals taken for this visit.  Physical Exam Constitutional:      General: She is not in acute distress. Musculoskeletal:        General: No deformity.  Neurological:     Mental Status: She is alert and oriented to person, place, and time.     Coordination: Coordination normal.  Psychiatric:        Attention and Perception: Attention and perception normal. She does not perceive auditory or visual hallucinations.        Mood and Affect: Mood normal. Mood is not anxious or depressed. Affect is not labile, blunt, angry or inappropriate.        Speech: Speech normal.        Behavior: Behavior normal.        Thought Content: Thought content normal. Thought content is not paranoid or delusional. Thought content does not include homicidal or suicidal ideation. Thought content does not include homicidal or suicidal plan.        Cognition and Memory: Cognition and memory normal.        Judgment: Judgment normal.     Comments: Insight intact     Lab Review:     Component Value Date/Time   NA 134 (L) 12/12/2020 0427   K 4.5 12/12/2020 0427   CL 103 12/12/2020 0427   CO2 23 12/12/2020 0427   GLUCOSE 129 (H) 12/12/2020 0427   BUN 14 12/12/2020 0427   CREATININE 0.75 12/12/2020 0427   CALCIUM 8.2 (L) 12/12/2020 0427   PROT 6.9 12/12/2020 0427   ALBUMIN 3.4 (L) 12/12/2020 0427   AST 23 12/12/2020 0427   ALT 23 12/12/2020 0427   ALKPHOS 79 12/12/2020 0427   BILITOT 0.5 12/12/2020 0427   GFRNONAA >60 12/12/2020 0427       Component Value Date/Time   WBC 12.0 (H) 12/12/2020 0427   RBC 4.13 12/12/2020 0427   HGB 12.2 12/12/2020 0427   HCT 37.3 12/12/2020 0427   PLT 331 12/12/2020  0427   MCV 90.3 12/12/2020 0427   MCH 29.5 12/12/2020 0427   MCHC 32.7 12/12/2020 0427   RDW 12.4 12/12/2020 0427   LYMPHSABS 1.1 12/12/2020 0427   MONOABS 0.2 12/12/2020 0427   EOSABS 0.0 12/12/2020 0427   BASOSABS 0.0 12/12/2020 0427    No results found for: "POCLITH", "LITHIUM"   No results found for: "PHENYTOIN", "PHENOBARB", "VALPROATE", "CBMZ"   .res Assessment: Plan:    Plan:  PDMP reviewed  Plan:  PDMP reviewed   Prozac '40mg'$  daily Lamictal '150mg'$  daily Clonidine 0.'2mg'$  at hs Adderall  $'30mg'L$  daily   RTC 6 months  Patient advised to contact office with any questions, adverse effects, or acute worsening in signs and symptoms.  Discussed potential benefits, risks, and side effects of stimulants with patient to include increased heart rate, palpitations, insomnia, increased anxiety, increased irritability, or decreased appetite.  Instructed patient to contact office if experiencing any significant tolerability issues.  Counseled patient regarding potential benefits, risks, and side effects of Lamictal to include potential risk of Stevens-Johnson syndrome. Advised patient to stop taking Lamictal and contact office immediately if rash develops and to seek urgent medical attention if rash is severe and/or spreading quickly.   Patient advised to contact office with any questions, adverse effects, or acute worsening in signs and symptoms.    Diagnoses and all orders for this visit:  Major depressive disorder, recurrent episode, moderate (HCC) -     lamoTRIgine (LAMICTAL) 150 MG tablet; Take one tablet at bedtime. -     FLUoxetine (PROZAC) 40 MG capsule; TAKE 1 CAPSULE BY MOUTH DAILY  Insomnia, unspecified type  Attention deficit hyperactivity disorder (ADHD), combined type -     cloNIDine (CATAPRES) 0.2 MG tablet; Take one and 1/2 at bedtime. -     amphetamine-dextroamphetamine (ADDERALL) 30 MG tablet; Take 1 tablet by mouth 2 (two) times daily. -      amphetamine-dextroamphetamine (ADDERALL) 30 MG tablet; Take 1 tablet by mouth 2 (two) times daily. -     amphetamine-dextroamphetamine (ADDERALL) 30 MG tablet; Take one tablet twice daily.  Generalized anxiety disorder  Mixed obsessional thoughts and acts  Panic attacks     Please see After Visit Summary for patient specific instructions.  Future Appointments  Date Time Provider [place]  05/28/2022  3:15 PM Ruby Cola, RD [place] [facility]  08/06/2022  3:00 PM Tanisa Lagace, Berdie Ogren, NP [facility] None    No orders of the defined types were placed in this encounter.   -------------------------------

## 2022-04-11 IMAGING — US US RENAL
1 series · 14 of 25 positions shown · non-contrast
Comparison: None.

CLINICAL DATA: Genetic susceptibility to other disease

Family history of renal cell carcinoma
EXAM:
RENAL / URINARY TRACT ULTRASOUND COMPLETE

[Series 1: us renal · 0.22mm/px · 14 of 45 slices shown]
[im 1/45]
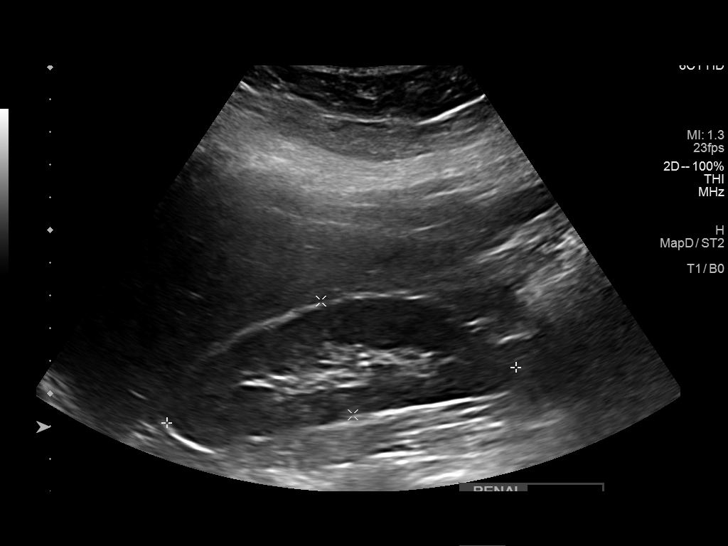
[im 4/45]
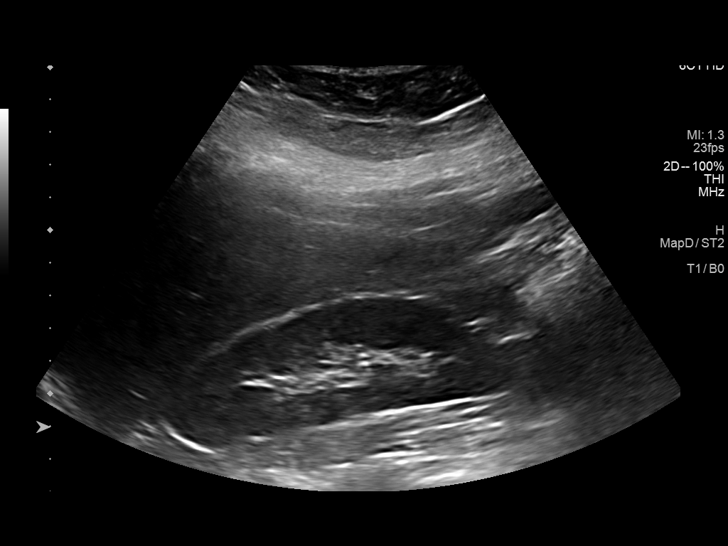
[im 8/45]
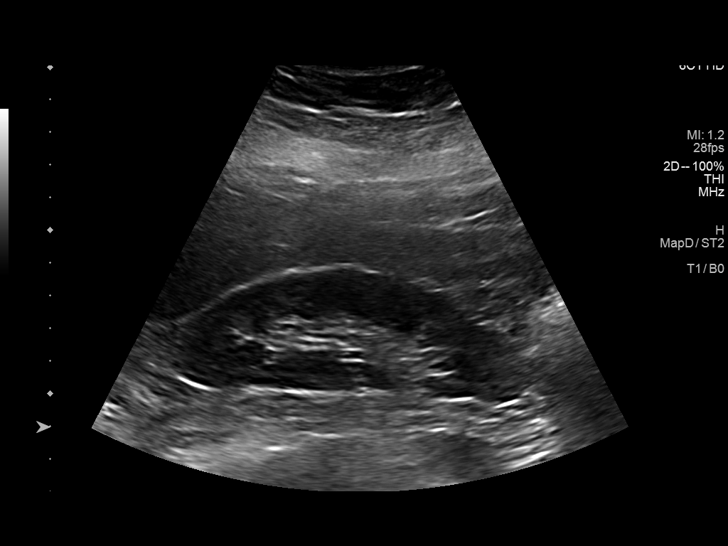
[im 12/45]
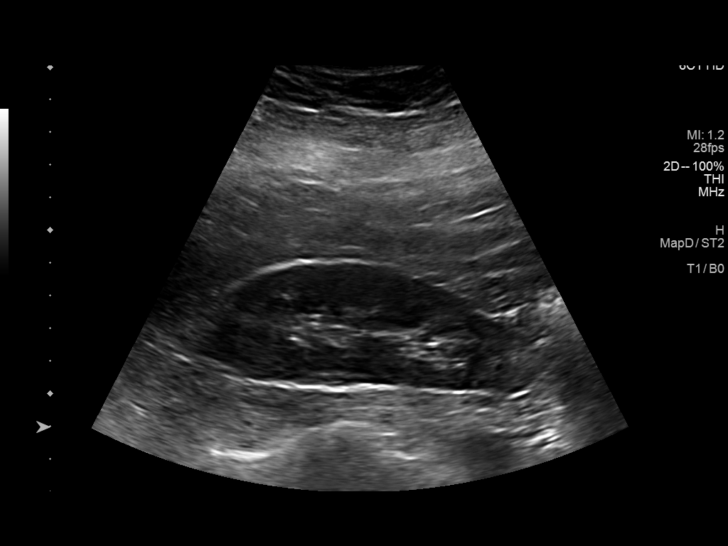
[im 15/45]
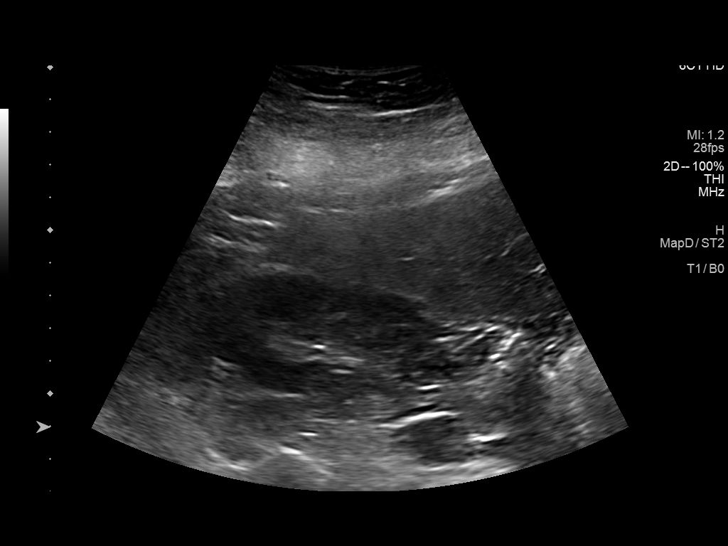
[im 17/45]
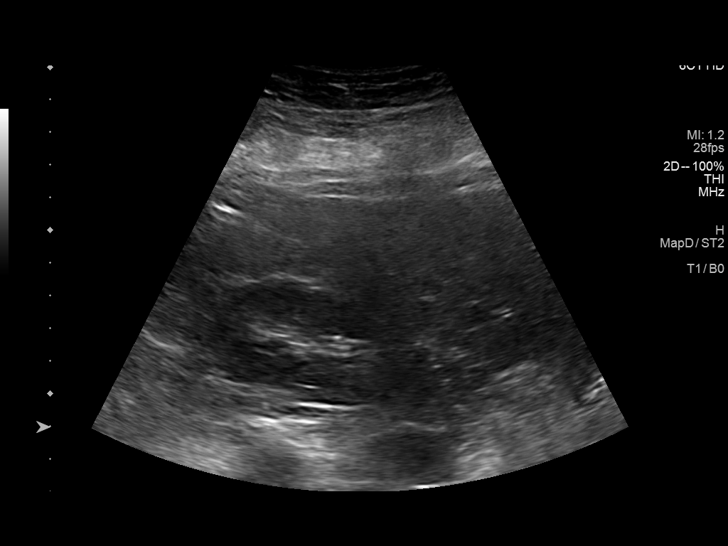
[im 21/45]
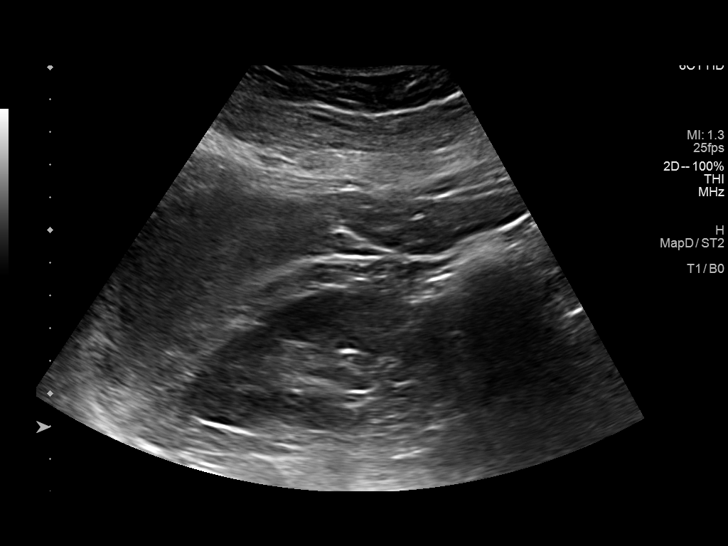
[im 24/45]
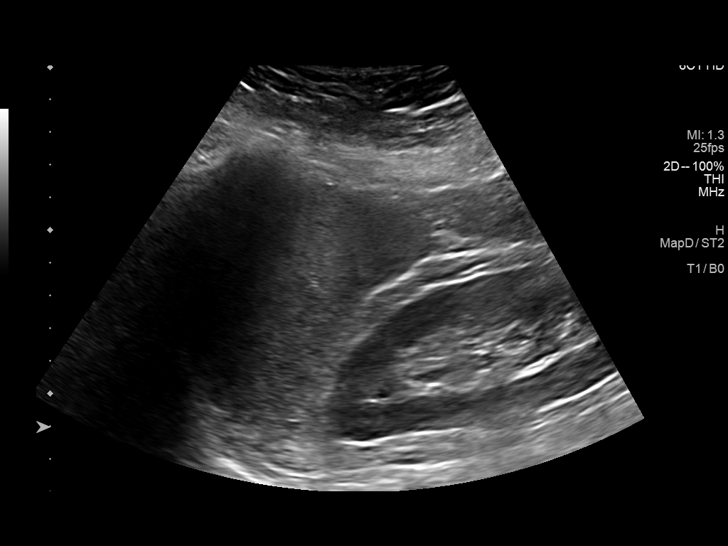
[im 28/45]
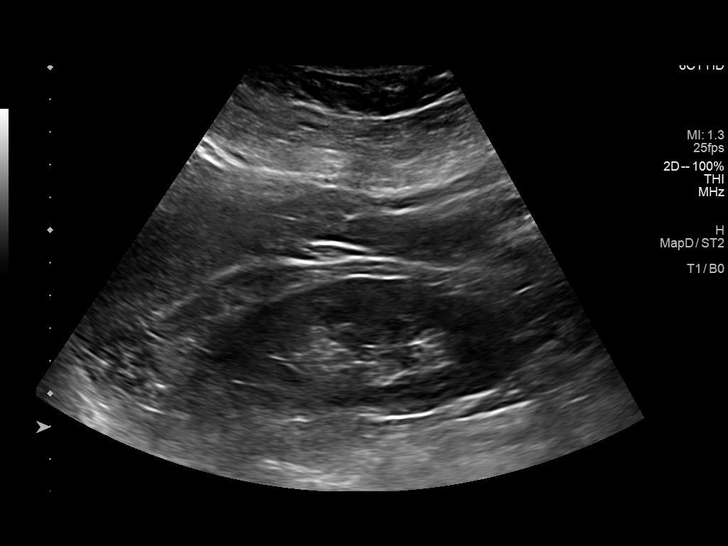
[im 30/45]
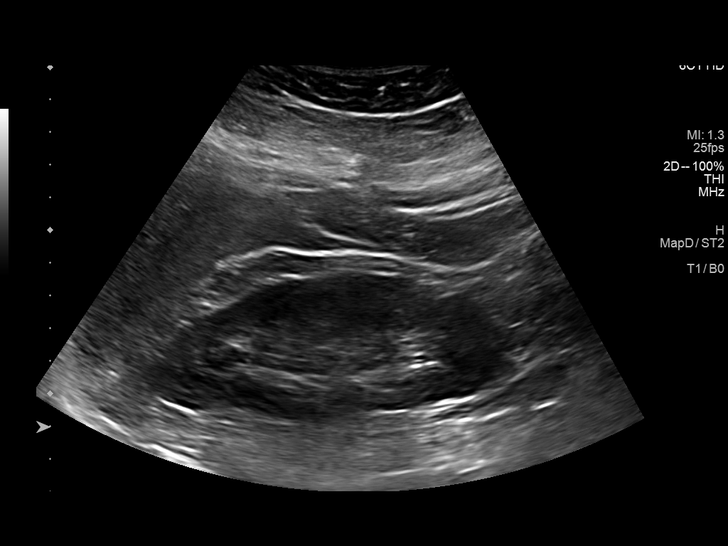
[im 34/45]
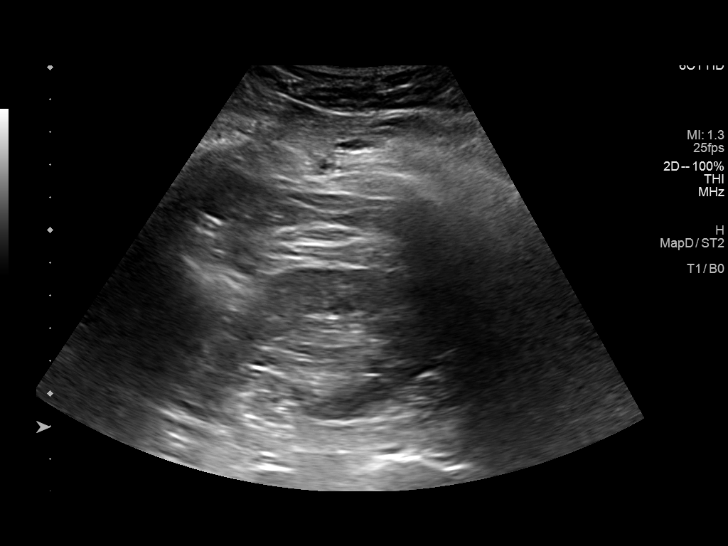
[im 37/45]
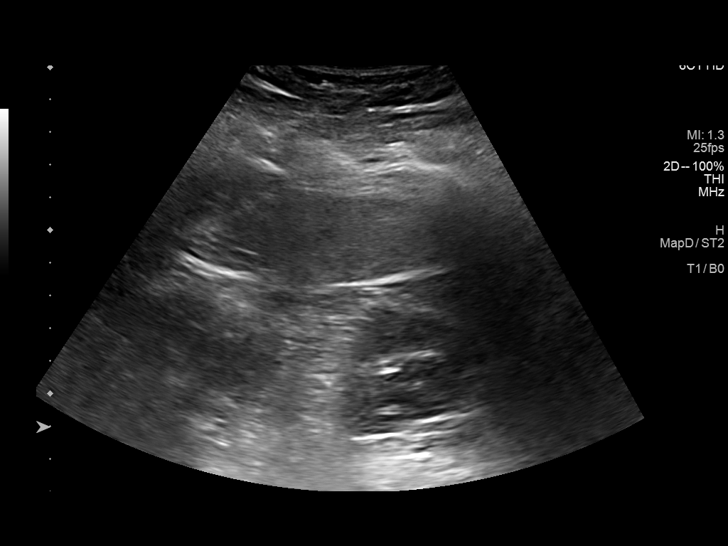
[im 41/45]
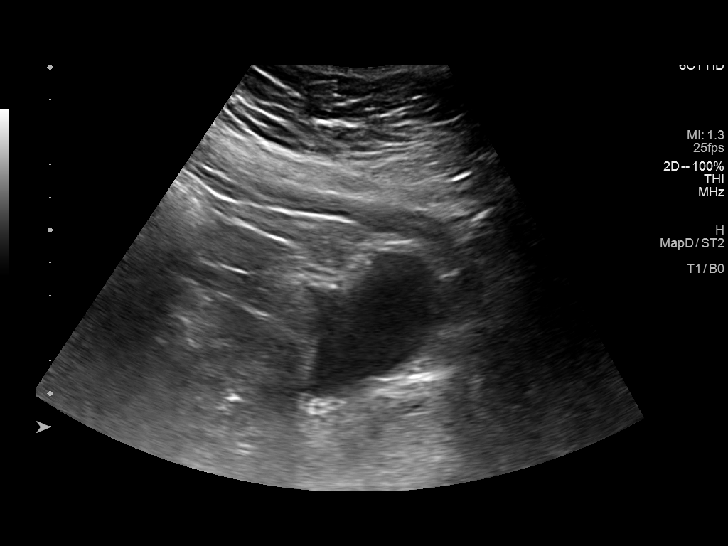
[im 45/45]
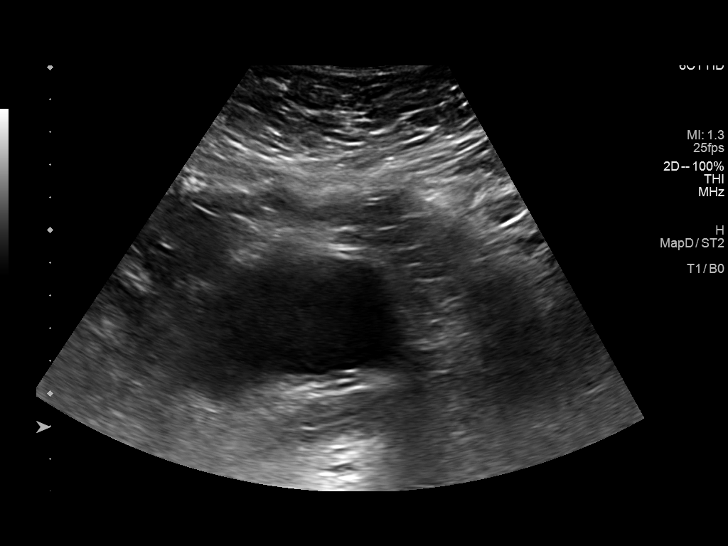

[14 of 25 positions shown; findings below may reference images not displayed]

FINDINGS: Right Kidney:

Renal measurements: 10.8 x 3.6 x 6.3 cm = volume: 129 mL.
Echogenicity within normal limits. No mass or hydronephrosis
visualized.

Left Kidney:

Renal measurements: 11.4 x 4.3 x 5.8 cm = volume: 146 mL.
Echogenicity within normal limits. No mass or hydronephrosis
visualized.

Bladder:

Appears normal for degree of bladder distention.

Other:

None.
IMPRESSION: No significant sonographic abnormality of the kidneys.

## 2022-05-28 ENCOUNTER — Encounter: Payer: Self-pay | Admitting: Skilled Nursing Facility1

## 2022-05-28 ENCOUNTER — Encounter: Payer: Commercial Managed Care - PPO | Attending: Internal Medicine | Admitting: Skilled Nursing Facility1

## 2022-05-28 NOTE — Progress Notes (Signed)
Follow-up visit:  Post-Operative 04/24/2021 Surgery   Surgery date: 12/11/2020 Surgery type: sleeve Start weight at [facility]: 307 (date: 04/18/2020) Weight today: 197.5 pounds   Body Composition Scale 02/06/2021 04/24/2021 07/23/2021 11/27/2021 05/28/2022  Current Body Weight 270.7 250.1 233.8 211.8 197.5  Total Body Fat % 47.5 45.8 44.2 41.6 39.7  Visceral Fat 18 16 15 13 12   Fat-Free Mass % 52.4 54.1 55.7 58.3 60.2   Total Body Water % 40.7 41.5 42.3 43.6 44.6  Muscle-Mass lbs 31.5 31.1 30.9 30.7 30.5  BMI 47.5 43.9 41 37.1 34.6  Body Fat Displacement              Torso  lbs 79.7 71.2 64 54.6 48.5         Left Leg  lbs 15.9 14.2 12.8 10.9 9.7         Right Leg  lbs 15.9 14.2 12.8 10.9 9.7         Left Arm  lbs 7.9 7.1 6.4 5.4 4.8         Right Arm   lbs 7.9 7.1 6.4 5.4 4.8    Pt arrives with her supportive husband.   Pt states her husband is a really good cook. 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   Pt states she does not like bread.  Pt states she no longer eats frozen meals.  Pt states her vitamin D has been low: vitamin D 27, B12 421  24 hr recall: Breakfast: kodiak protein oatmeal or cereal or eggs + sausage or ham Snack: protein shake or snack almond/cheese Lunch: chicken soup or cowboy stew or pork loin or hamburger steak or chopping slaw with seeds and cranberries and seeds Snack: protein bar or dry cereal or cracker with cheese or apple with skin  Dinner: chicken soup or cowboy stew or pork loin or hamburger steak or chopping slaw with seeds and cranberries and seeds Snack: sugar free vanilla pudding  Beverages: sugar free creamer latte, water + flavoring (3 days out of one packet), water  Fluid intake: 80 ounces   Medications: See List Supplementation: multi and calcium: inconsistent   Using straws: no Drinking while eating: no Having you been chewing well: yes Chewing/swallowing difficulties:  no Changes in vision: no Changes to mood/headaches: no Hair loss/Changes to skin/Changes to nails: no Any difficulty focusing or concentrating: no Sweating: no Dizziness/Lightheaded: no Palpitations: no  Carbonated beverages: no N/V/D/C/GAS: no Abdominal Pain: no Dumping syndrome: no  Recent physical activity:  animal [employer] for work, recumbent bike 3 days a week 2-3 miles, some kind of cardio machine    Encouraged patient to honor their body's internal hunger and fullness cues.  Throughout the day, check in mentally and rate hunger. Stop eating when satisfied not full regardless of how much food is left on the plate.  Get more if still hungry 20-30 minutes later.  The key is to honor satisfaction so throughout the meal, rate fullness factor and stop when comfortably satisfied not physically full. The key is to honor hunger and fullness without any feelings of guilt or shame.  Pay attention to what the internal cues are, rather than any external factors. This will enhance the confidence you have in listening to your own body and following those internal cues enabling you to increase how often you eat when you are hungry not out of appetite and stop when you are satisfied not  full.  Encouraged pt to continue to eat balanced meals inclusive of non starchy vegetables 2 times a day 7 days a week Encouraged pt to choose lean protein sources: limiting beef, pork, sausage, hotdogs, and lunch meat Encourage pt to choose healthy fats such as plant based limiting animal fats Encouraged pt to continue to drink a minium 64 fluid ounces with half being plain water to satisfy proper hydration  Creation of balanced and diverse meals to increase the intake of nutrient-rich foods that provide essential vitamins, minerals, fiber, and phytonutrients  Variety of Fruits and Vegetables:  Aim for a colorful array of fruits and vegetables to ensure a wide range of nutrients. Include a mix of leafy greens,  berries, citrus fruits, cruciferous vegetables, and more. Whole Grains: Choose whole grains over refined grains. Examples include brown rice, quinoa, oats, whole wheat, and barley. Lean Proteins: Include lean sources of protein, such as poultry, fish, tofu, legumes, beans, lentils, and low-fat dairy products. Limit red and processed meats. Healthy Fats: Incorporate sources of healthy fats, including avocados, nuts, seeds, and olive oil. Limit saturated and trans fats found in fried and processed foods. Dairy or Dairy Alternatives: Choose low-fat or fat-free dairy products, or plant-based alternatives like almond or soy milk. Portion Control: Be mindful of portion sizes to avoid overeating. Pay attention to hunger and satisfaction cues. Limit Added Sugars: Minimize the consumption of sugary beverages, snacks, and desserts. Check food labels for added sugars and opt for natural sources of sweetness such as whole fruits. Hydration: Drink plenty of water throughout the day. Limit sugary drinks and excessive caffeine intake. Moderate Sodium Intake: Reduce the consumption of high-sodium foods. Use herbs and spices for flavor instead of excessive salt. Meal Planning and Preparation: Plan and prepare meals ahead of time to make healthier choices more convenient. Include a mix of food groups in each meal. Limit Processed Foods: Minimize the intake of highly processed and packaged foods that are often high in added sugars, salt, and unhealthy fats. Regular Physical Activity: Combine a healthy diet with regular physical activity for overall well-being. Aim for at least 150 minutes of moderate-intensity aerobic exercise per week, along with strength training. Moderation and Balance: Enjoy treats and indulgent foods in moderation, emphasizing balance rather than strict restriction.   Progress Towards Goal(s):  In Progress Teaching method utilized: Patent attorney & Auditory  Demonstrated degree of  understanding via: Teach Back  Readiness Level: Action Barriers to learning/adherence to lifestyle change: none identified   Teaching Method Utilized:  Visual Auditory Hands on  Demonstrated degree of understanding via:  Teach Back   Monitoring/Evaluation:  Dietary intake, exercise, and body weight.

## 2022-08-06 ENCOUNTER — Encounter: Payer: Self-pay | Admitting: Adult Health

## 2022-08-06 ENCOUNTER — Ambulatory Visit: Payer: Commercial Managed Care - PPO | Admitting: Adult Health

## 2022-08-06 DIAGNOSIS — F411 Generalized anxiety disorder: Secondary | ICD-10-CM

## 2022-08-06 DIAGNOSIS — F902 Attention-deficit hyperactivity disorder, combined type: Secondary | ICD-10-CM | POA: Diagnosis not present

## 2022-08-06 DIAGNOSIS — F331 Major depressive disorder, recurrent, moderate: Secondary | ICD-10-CM | POA: Diagnosis not present

## 2022-08-06 DIAGNOSIS — G4700 Insomnia, unspecified: Secondary | ICD-10-CM | POA: Diagnosis not present

## 2022-08-06 MED ORDER — CLONIDINE HCL 0.2 MG PO TABS: ORAL_TABLET | ORAL | 5 refills | Status: DC

## 2022-08-06 MED ORDER — LAMOTRIGINE 150 MG PO TABS: ORAL_TABLET | ORAL | 5 refills | Status: DC

## 2022-08-06 MED ORDER — AMPHETAMINE-DEXTROAMPHETAMINE 30 MG PO TABS: 30.0000 mg | ORAL_TABLET | Freq: Two times a day (BID) | ORAL | 0 refills | Status: DC

## 2022-08-06 MED ORDER — FLUOXETINE HCL 40 MG PO CAPS: ORAL_CAPSULE | ORAL | 5 refills | Status: DC

## 2022-08-06 MED ORDER — AMPHETAMINE-DEXTROAMPHETAMINE 30 MG PO TABS: ORAL_TABLET | ORAL | 0 refills | Status: DC

## 2022-08-06 NOTE — Progress Notes (Signed)
[name] [id number] 1975/09/21 47 y.o.  Subjective:   Patient ID:  [name] is a 47 y.o. (DOB [birth year]) female.  Chief Complaint: No chief complaint on file.   HPI [name] presents to the office today for follow-up of ADHD, GAD, MDD and insomnia.   Describes mood today as "ok". Pleasant. Mood symptoms - denies depression and irritability. Reports anxiety - more situational. Denies recent panic attacks. Reports worry, rumination, and over thinking - more situational. Mood is consistent. Stating "I feel like I'm doing good". Feels like medications are helpful. She and family doing well. Taking medications as prescribed.  Energy levels stable. Active, has a regular exercise routine. Walking. Enjoys some usual interests and activities. Married. Lives husband - has 3 sons. Spending time with family. Appetite varies. Weight loss - 110 pounds.  Sleeping well most nights. Averages 8 hours.  Focus and concentration improved. Completing tasks. Managing aspects of household. 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  Denies SI or HI.  Denies AH or VH. Denies self harm. Denies substance use.  Previous medications: Seroquel - tremors, Wellbutrin   PHQ2-9    Flowsheet Row Nutrition from 04/18/2020 in [facility] from 07/29/2016 in [facility]  PHQ-2 Total Score 0 6  PHQ-9 Total Score -- 21      Flowsheet Row Pre-Admission Testing 60 from 08/23/2021 in [facility] from 12/06/2020 in [facility] Testing 60 from 12/04/2020 in [facility]  C-SSRS RISK CATEGORY No Risk No Risk No Risk        Review of Systems:  Review of Systems  Musculoskeletal:  Negative for gait problem.  Neurological:  Negative for tremors.  Psychiatric/Behavioral:         Please refer to HPI     Medications: I have reviewed the patient's current medications.  Current Outpatient Medications  Medication Sig Dispense Refill   acetaminophen (TYLENOL) 500 MG tablet Take 1,000 mg by mouth every 6 (six) hours as needed for moderate pain.     albuterol (VENTOLIN HFA) 108 (90 Base) MCG/ACT inhaler Inhale 1-2 puffs into the lungs every 6 (six) hours as needed for wheezing or shortness of breath.     amphetamine-dextroamphetamine (ADDERALL) 30 MG tablet Take 1 tablet by mouth 2 (two) times daily. 60 tablet 0   amphetamine-dextroamphetamine (ADDERALL) 30 MG tablet Take 1 tablet by mouth 2 (two) times daily. 60 tablet 0   amphetamine-dextroamphetamine (ADDERALL) 30 MG tablet Take one tablet twice daily. 60 tablet 0   cloNIDine (CATAPRES) 0.2 MG tablet Take one and 1/2 at bedtime. 45 tablet 5   FLUoxetine (PROZAC) 40 MG capsule TAKE 1 CAPSULE BY MOUTH DAILY 30 capsule 5   lamoTRIgine (LAMICTAL) 150 MG tablet Take one tablet at bedtime. 30 tablet 5   Multiple Vitamin (MULTIVITAMIN WITH MINERALS) TABS tablet Take 1 tablet by mouth daily.     No current facility-administered medications for this visit.    Medication Side Effects: None  Allergies:  Allergies  Allergen Reactions   Codeine Nausea And Vomiting   Other Nausea Only    Pt has severe nausea with anesthesia   Wound Dressing Adhesive Rash    Past Medical History:  Diagnosis Date   Anxiety    Arthritis    Bilateral knees and bilateral ankles   Asthma    Autism    Bipolar  disorder (HCC)    Depression    Family history of adverse reaction to anesthesia    Post op nausea and vomiting  mom and brother   Family history of breast cancer    Family history of kidney cancer    Family history of prostate cancer    GERD (gastroesophageal reflux disease)    Headache    Migraines   History of hiatal hernia    History of shoulder surgery    Hypothyroidism    Obesity    Plantar fasciitis 04/26/2013   Pneumonia    PONV  (postoperative nausea and vomiting)    Post op nausea and vomiting    Past Medical History, Surgical history, Social history, and Family history were reviewed and updated as appropriate.   Please see review of systems for further details on the patient's review from today.   Objective:   Physical Exam:  There were no vitals taken for this visit.  Physical Exam Constitutional:      General: She is not in acute distress. Musculoskeletal:        General: No deformity.  Neurological:     Mental Status: She is alert and oriented to person, place, and time.     Coordination: Coordination normal.  Psychiatric:        Attention and Perception: Attention and perception normal. She does not perceive auditory or visual hallucinations.        Mood and Affect: Mood normal. Mood is not anxious or depressed. Affect is not labile, blunt, angry or inappropriate.        Speech: Speech normal.        Behavior: Behavior normal.        Thought Content: Thought content normal. Thought content is not paranoid or delusional. Thought content does not include homicidal or suicidal ideation. Thought content does not include homicidal or suicidal plan.        Cognition and Memory: Cognition and memory normal.        Judgment: Judgment normal.     Comments: Insight intact     Lab Review:     Component Value Date/Time   NA 134 (L) 12/12/2020 0427   K 4.5 12/12/2020 0427   CL 103 12/12/2020 0427   CO2 23 12/12/2020 0427   GLUCOSE 129 (H) 12/12/2020 0427   BUN 14 12/12/2020 0427   CREATININE 0.75 12/12/2020 0427   CALCIUM 8.2 (L) 12/12/2020 0427   PROT 6.9 12/12/2020 0427   ALBUMIN 3.4 (L) 12/12/2020 0427   AST 23 12/12/2020 0427   ALT 23 12/12/2020 0427   ALKPHOS 79 12/12/2020 0427   BILITOT 0.5 12/12/2020 0427   GFRNONAA >60 12/12/2020 0427       Component Value Date/Time   WBC 12.0 (H) 12/12/2020 0427   RBC 4.13 12/12/2020 0427   HGB 12.2 12/12/2020 0427   HCT 37.3 12/12/2020 0427   PLT  331 12/12/2020 0427   MCV 90.3 12/12/2020 0427   MCH 29.5 12/12/2020 0427   MCHC 32.7 12/12/2020 0427   RDW 12.4 12/12/2020 0427   LYMPHSABS 1.1 12/12/2020 0427   MONOABS 0.2 12/12/2020 0427   EOSABS 0.0 12/12/2020 0427   BASOSABS 0.0 12/12/2020 0427    No results found for: "POCLITH", "LITHIUM"   No results found for: "PHENYTOIN", "PHENOBARB", "VALPROATE", "CBMZ"   .res Assessment: Plan:    Plan:  PDMP reviewed   Prozac 40mg  daily Lamictal 150mg  daily Clonidine 0.2mg  at hs Adderall 30mg  daily   Monitor BP  between visits while taking stimulant medication. BP 110/70  RTC 6 months  Patient advised to contact office with any questions, adverse effects, or acute worsening in signs and symptoms.  Discussed potential benefits, risks, and side effects of stimulants with patient to include increased heart rate, palpitations, insomnia, increased anxiety, increased irritability, or decreased appetite.  Instructed patient to contact office if experiencing any significant tolerability issues.  Counseled patient regarding potential benefits, risks, and side effects of Lamictal to include potential risk of Stevens-Johnson syndrome. Advised patient to stop taking Lamictal and contact office immediately if rash develops and to seek urgent medical attention if rash is severe and/or spreading quickly.   Patient advised to contact office with any questions, adverse effects, or acute worsening in signs and symptoms.   There are no diagnoses linked to this encounter.   Please see After Visit Summary for patient specific instructions.  Future Appointments  Date Time Provider Department Center  08/06/2022  3:00 PM Mitchell Iwanicki, Thereasa Solo, NP [facility] None    No orders of the defined types were placed in this encounter.   -------------------------------

## 2022-12-04 ENCOUNTER — Other Ambulatory Visit (HOSPITAL_COMMUNITY): Payer: Self-pay | Admitting: Family Medicine

## 2022-12-04 DIAGNOSIS — Z1231 Encounter for screening mammogram for malignant neoplasm of breast: Secondary | ICD-10-CM

## 2022-12-09 ENCOUNTER — Ambulatory Visit (HOSPITAL_COMMUNITY)
Admission: RE | Admit: 2022-12-09 | Discharge: 2022-12-09 | Disposition: A | Discharge status: Home / Self Care | Payer: BC Managed Care – PPO | Source: Ambulatory Visit | Attending: Family Medicine | Admitting: Family Medicine

## 2022-12-09 DIAGNOSIS — Z1231 Encounter for screening mammogram for malignant neoplasm of breast: Secondary | ICD-10-CM | POA: Diagnosis present

## 2023-01-17 ENCOUNTER — Telehealth: Payer: Self-pay | Admitting: Adult Health

## 2023-01-17 ENCOUNTER — Other Ambulatory Visit: Payer: Self-pay

## 2023-01-17 DIAGNOSIS — F902 Attention-deficit hyperactivity disorder, combined type: Secondary | ICD-10-CM

## 2023-01-17 MED ORDER — AMPHETAMINE-DEXTROAMPHETAMINE 30 MG PO TABS: ORAL_TABLET | ORAL | 0 refills | Status: DC

## 2023-01-17 NOTE — Telephone Encounter (Signed)
 Pt  lvm that she needed  her adderall 30 mg filled. Pharmacy is [facility] on [address] in [place]

## 2023-01-17 NOTE — Telephone Encounter (Signed)
 Only sent 1 rx per [facility] r[facility].

## 2023-01-17 NOTE — Telephone Encounter (Signed)
 Removed the 2 additional rx's.

## 2023-01-17 NOTE — Telephone Encounter (Signed)
 Pended 3 rx adderall 30 mg to [facility] on [address]

## 2023-02-06 ENCOUNTER — Ambulatory Visit: Payer: Commercial Managed Care - PPO | Admitting: Adult Health

## 2023-02-06 ENCOUNTER — Encounter: Payer: Self-pay | Admitting: Adult Health

## 2023-02-06 DIAGNOSIS — F411 Generalized anxiety disorder: Secondary | ICD-10-CM

## 2023-02-06 DIAGNOSIS — F331 Major depressive disorder, recurrent, moderate: Secondary | ICD-10-CM

## 2023-02-06 DIAGNOSIS — F902 Attention-deficit hyperactivity disorder, combined type: Secondary | ICD-10-CM | POA: Diagnosis not present

## 2023-02-06 DIAGNOSIS — F410 Panic disorder [episodic paroxysmal anxiety] without agoraphobia: Secondary | ICD-10-CM

## 2023-02-06 DIAGNOSIS — G4700 Insomnia, unspecified: Secondary | ICD-10-CM | POA: Diagnosis not present

## 2023-02-06 MED ORDER — AMPHETAMINE-DEXTROAMPHETAMINE 30 MG PO TABS: 30.0000 mg | ORAL_TABLET | Freq: Two times a day (BID) | ORAL | 0 refills | Status: DC

## 2023-02-06 MED ORDER — FLUOXETINE HCL 40 MG PO CAPS: ORAL_CAPSULE | ORAL | 5 refills | Status: DC

## 2023-02-06 MED ORDER — LAMOTRIGINE 150 MG PO TABS: ORAL_TABLET | ORAL | 5 refills | Status: DC

## 2023-02-06 MED ORDER — AMPHETAMINE-DEXTROAMPHETAMINE 30 MG PO TABS: ORAL_TABLET | ORAL | 0 refills | Status: DC

## 2023-02-06 MED ORDER — CLONIDINE HCL 0.2 MG PO TABS: ORAL_TABLET | ORAL | 5 refills | Status: DC

## 2023-02-06 NOTE — Progress Notes (Signed)
[name] [id number] September 27, 1975 48 y.o.  Subjective:   Patient ID:  [name] is a 48 y.o. (DOB 06/08/1975) female.  Chief Complaint: No chief complaint on file.   HPI Jiali Morning presents to the office today for follow-up of ADHD, GAD, MDD and insomnia.   Describes mood today as "ok". Pleasant. Mood symptoms - denies depression and irritability. Reports anxiety - more situational. Denies recent panic attacks. Reports some worry, rumination, and over thinking - more situational. Mood is stable. Stating "I feel like I'm doing alright". Feels like medications are helpful. She and family doing well. Taking medications as prescribed.  Energy levels stable. Active, has a regular exercise routine. Walking. Enjoys some usual interests and activities. Married. Lives husband - has 3 sons. Spending time with family. Appetite varies. Weight stable.  Sleeping well most nights. Averages 8 hours.  Focus and concentration improved with Adderall. Completing tasks. Managing aspects of household. Working full time at [employer].   Denies SI or HI.  Denies AH or VH. Denies self harm. Denies substance use.  Previous medications: Seroquel - tremors, Wellbutrin   PHQ2-9    Flowsheet Row Nutrition from 04/18/2020 in [facility]  - A Dept Of [place]. [facility] Office Visit from 07/29/2016 in [facility]  PHQ-2 Total Score 0 6  PHQ-9 Total Score -- 21      Flowsheet Row Pre-Admission Testing 61 from 08/23/2021 in [facility] from 12/06/2020 in [facility] Testing 60 from [facility]/04/2020 [facility]  C-SSRS RISK CATEGORY No Risk No Risk No Risk        Review of Systems:  Review of Systems  Musculoskeletal:  Negative for gait problem.  Neurological:  Negative for tremors.  Psychiatric/Behavioral:         Please refer to HPI    Medications: I have reviewed the  patient's current medications.  Current Outpatient Medications  Medication Sig Dispense Refill   acetaminophen (TYLENOL) 500 MG tablet Take 1,000 mg by mouth every 6 (six) hours as needed for moderate pain.     albuterol (VENTOLIN HFA) 108 (90 Base) MCG/ACT inhaler Inhale 1-2 puffs into the lungs every 6 (six) hours as needed for wheezing or shortness of breath.     amphetamine-dextroamphetamine (ADDERALL) 30 MG tablet Take 1 tablet by mouth 2 (two) times daily. 60 tablet 0   [START ON 03/06/2023] amphetamine-dextroamphetamine (ADDERALL) 30 MG tablet Take 1 tablet by mouth 2 (two) times daily. 60 tablet 0   [START ON 04/03/2023] amphetamine-dextroamphetamine (ADDERALL) 30 MG tablet Take one tablet twice daily. 60 tablet 0   cloNIDine (CATAPRES) 0.2 MG tablet Take two to three tablets at bedtime for sleep. 90 tablet 5   FLUoxetine (PROZAC) 40 MG capsule TAKE 1 CAPSULE BY MOUTH DAILY 30 capsule 5   lamoTRIgine (LAMICTAL) 150 MG tablet Take one tablet at bedtime. 30 tablet 5   Multiple Vitamin (MULTIVITAMIN WITH MINERALS) TABS tablet Take 1 tablet by mouth daily.     No current facility-administered medications for this visit.    Medication Side Effects: None  Allergies:  Allergies  Allergen Reactions   Codeine Nausea And Vomiting   Other Nausea Only    Pt has severe nausea with anesthesia   Wound Dressing Adhesive Rash    Past Medical History:  Diagnosis Date   Anxiety    Arthritis    Bilateral knees and bilateral ankles  Asthma    Autism    Bipolar disorder (HCC)    Depression    Family history of adverse reaction to anesthesia    Post op nausea and vomiting  mom and brother   Family history of breast cancer    Family history of kidney cancer    Family history of prostate cancer    GERD (gastroesophageal reflux disease)    Headache    Migraines   History of hiatal hernia    History of shoulder surgery    Hypothyroidism    Obesity    Plantar fasciitis 04/26/2013    Pneumonia    PONV (postoperative nausea and vomiting)    Post op nausea and vomiting    Past Medical History, Surgical history, Social history, and Family history were reviewed and updated as appropriate.   Please see review of systems for further details on the patient's review from today.   Objective:   Physical Exam:  There were no vitals taken for this visit.  Physical Exam Constitutional:      General: She is not in acute distress. Musculoskeletal:        General: No deformity.  Neurological:     Mental Status: She is alert and oriented to person, place, and time.     Coordination: Coordination normal.  Psychiatric:        Attention and Perception: Attention and perception normal. She does not perceive auditory or visual hallucinations.        Mood and Affect: Mood normal. Mood is not anxious or depressed. Affect is not labile, blunt, angry or inappropriate.        Speech: Speech normal.        Behavior: Behavior normal.        Thought Content: Thought content normal. Thought content is not paranoid or delusional. Thought content does not include homicidal or suicidal ideation. Thought content does not include homicidal or suicidal plan.        Cognition and Memory: Cognition and memory normal.        Judgment: Judgment normal.     Comments: Insight intact     Lab Review:     Component Value Date/Time   NA 134 (L) 12/12/2020 0427   K 4.5 12/12/2020 0427   CL 103 12/12/2020 0427   CO2 23 12/12/2020 0427   GLUCOSE 129 (H) 12/12/2020 0427   BUN 14 12/12/2020 0427   CREATININE 0.75 12/12/2020 0427   CALCIUM 8.2 (L) 12/12/2020 0427   PROT 6.9 12/12/2020 0427   ALBUMIN 3.4 (L) 12/12/2020 0427   AST 23 12/12/2020 0427   ALT 23 12/12/2020 0427   ALKPHOS 79 12/12/2020 0427   BILITOT 0.5 12/12/2020 0427   GFRNONAA >60 12/12/2020 0427       Component Value Date/Time   WBC 12.0 (H) 12/12/2020 0427   RBC 4.13 12/12/2020 0427   HGB 12.2 12/12/2020 0427   HCT 37.3  12/12/2020 0427   PLT 331 12/12/2020 0427   MCV 90.3 12/12/2020 0427   MCH 29.5 12/12/2020 0427   MCHC 32.7 12/12/2020 0427   RDW 12.4 12/12/2020 0427   LYMPHSABS 1.1 12/12/2020 0427   MONOABS 0.2 12/12/2020 0427   EOSABS 0.0 12/12/2020 0427   BASOSABS 0.0 12/12/2020 0427    No results found for: "POCLITH", "LITHIUM"   No results found for: "PHENYTOIN", "PHENOBARB", "VALPROATE", "CBMZ"   .res Assessment: Plan:   Plan:  PDMP reviewed   Prozac 40mg  daily Lamictal 150mg  daily Clonidine 0.2mg  -  2 to 3 at hs Adderall 30mg  daily   Monitor BP between visits while taking stimulant medication. BP 110/70  RTC 6 months  - will call in 3 months for next set of refills on stimulant.  Patient advised to contact office with any questions, adverse effects, or acute worsening in signs and symptoms.  Discussed potential benefits, risks, and side effects of stimulants with patient to include increased heart rate, palpitations, insomnia, increased anxiety, increased irritability, or decreased appetite.  Instructed patient to contact office if experiencing any significant tolerability issues.  Counseled patient regarding potential benefits, risks, and side effects of Lamictal to include potential risk of Stevens-Johnson syndrome. Advised patient to stop taking Lamictal and contact office immediately if rash develops and to seek urgent medical attention if rash is severe and/or spreading quickly.  Patient advised to contact office with any questions, adverse effects, or acute worsening in signs and symptoms.  Diagnoses and all orders for this visit:  Major depressive disorder, recurrent episode, moderate (HCC) -     FLUoxetine (PROZAC) 40 MG capsule; TAKE 1 CAPSULE BY MOUTH DAILY -     lamoTRIgine (LAMICTAL) 150 MG tablet; Take one tablet at bedtime.  Attention deficit hyperactivity disorder (ADHD), combined type -     cloNIDine (CATAPRES) 0.2 MG tablet; Take two to three tablets at bedtime  for sleep. -     amphetamine-dextroamphetamine (ADDERALL) 30 MG tablet; Take 1 tablet by mouth 2 (two) times daily. -     amphetamine-dextroamphetamine (ADDERALL) 30 MG tablet; Take 1 tablet by mouth 2 (two) times daily. -     amphetamine-dextroamphetamine (ADDERALL) 30 MG tablet; Take one tablet twice daily.  Generalized anxiety disorder  Insomnia, unspecified type  Panic attacks     Please see After Visit Summary for patient specific instructions.  No future appointments.   No orders of the defined types were placed in this encounter.   -------------------------------

## 2023-05-23 ENCOUNTER — Encounter (INDEPENDENT_AMBULATORY_CARE_PROVIDER_SITE_OTHER): Payer: Self-pay

## 2023-05-27 ENCOUNTER — Encounter: Admitting: Gastroenterology

## 2023-05-29 ENCOUNTER — Other Ambulatory Visit: Payer: Self-pay | Admitting: Medical Genetics

## 2023-06-03 ENCOUNTER — Encounter: Payer: Self-pay | Admitting: Women's Health

## 2023-06-03 ENCOUNTER — Other Ambulatory Visit (HOSPITAL_COMMUNITY)
Admission: RE | Admit: 2023-06-03 | Discharge: 2023-06-03 | Disposition: A | Discharge status: Home / Self Care | Source: Ambulatory Visit | Attending: Women's Health | Admitting: Women's Health

## 2023-06-03 ENCOUNTER — Ambulatory Visit: Admitting: Women's Health

## 2023-06-03 VITALS — BP 125/86 | HR 80 | Ht 63.0 in | Wt 214.0 lb

## 2023-06-03 DIAGNOSIS — R232 Flushing: Secondary | ICD-10-CM

## 2023-06-03 DIAGNOSIS — Z8742 Personal history of other diseases of the female genital tract: Secondary | ICD-10-CM

## 2023-06-03 DIAGNOSIS — L292 Pruritus vulvae: Secondary | ICD-10-CM | POA: Insufficient documentation

## 2023-06-03 DIAGNOSIS — N907 Vulvar cyst: Secondary | ICD-10-CM

## 2023-06-03 DIAGNOSIS — N951 Menopausal and female climacteric states: Secondary | ICD-10-CM | POA: Diagnosis not present

## 2023-06-03 DIAGNOSIS — L900 Lichen sclerosus et atrophicus: Secondary | ICD-10-CM | POA: Diagnosis not present

## 2023-06-03 DIAGNOSIS — N898 Other specified noninflammatory disorders of vagina: Secondary | ICD-10-CM

## 2023-06-03 DIAGNOSIS — R4586 Emotional lability: Secondary | ICD-10-CM

## 2023-06-03 DIAGNOSIS — R61 Generalized hyperhidrosis: Secondary | ICD-10-CM

## 2023-06-03 DIAGNOSIS — L680 Hirsutism: Secondary | ICD-10-CM

## 2023-06-03 MED ORDER — FLUOCINONIDE EMULSIFIED BASE 0.05 % EX CREA: TOPICAL_CREAM | CUTANEOUS | 6 refills | Status: AC

## 2023-06-03 NOTE — Patient Instructions (Signed)
 Itchy, Painful, White Skin Patches (Lichen Sclerosus): What to Know Lichen sclerosus is a skin problem. It can happen on any part of your body, but often affects the areas around your genitals or the opening of your butt (anus). Treatment can help to control symptoms. It can also help prevent scarring that may lead to other problems. Lichen sclerosus isn't an infection or a fungus. It can't be passed from person to person. What are the causes? The cause of this condition isn't known. It may be related to: The body's defense system, also called the immune system, reacting too strongly. A lack of certain hormones. What increases the risk? You're more likely to get this condition if: You're a female who has stopped having periods. This is called menopause. You're a female who wasn't circumcised. You're a child who hasn't reached puberty yet. What are the signs or symptoms?  Plaques. These are areas on the skin that are: White. Thin. Wrinkled. Thickened. Red and swollen patches on the skin. Tears or cracks in the skin. Bruising. Blood blisters. Very bad itching. Pain, itching, or burning when peeing. Trouble pooping (constipation), especially in children. How is this diagnosed? This condition may be diagnosed with a physical exam. Sometimes, a tissue sample is taken and checked under a microscope. This is called a biopsy. How is this treated? This condition may be treated with: Topical steroids. These are creams and ointments that are put on the skin. Medicines taken by mouth. Topical immunotherapy. These are creams and ointments that are put on the skin to stimulate the body's defense system. Surgery. This may need to be done if there are problems like scarring. Follow these instructions at home: Medicines Take or apply your medicines only as told. Use creams or ointments as told by your health care provider. Skin care Do not scratch the affected areas. Keep the affected areas  clean and dry. Clean the affected area gently with water  only. Pat your skin dry. Avoid using rough towels or toilet paper. Avoid skin products that irritate the skin. These include scented soaps, lotions, and bubble bath. Use thick cream or ointments to lessen itching as told. General instructions Your condition may cause trouble pooping. To help prevent or treat this, you may need to: Take medicines to help you poop. Eat foods high in fiber, like beans, whole grains, and fresh fruits and vegetables. Drink more fluids as told. Keep all follow-up visits to make sure the treatment plan is working. Contact a health care provider if: You have more redness, swelling, or pain in the affected area. You have fluid, blood, or pus coming from the affected area. You have new patches on your skin. You have a fever. You have pain during sex. This information is not intended to replace advice given to you by your health care provider. Make sure you discuss any questions you have with your health care provider. Document Revised: 08/06/2022 Document Reviewed: 08/06/2022 Elsevier Patient Education  2024 [employer].

## 2023-06-03 NOTE — Progress Notes (Signed)
 GYN VISIT Patient name: [name] MRN [id number]  Date of birth: 10-31-1975 Chief Complaint:   vaginal itching, wanting to check mole in the area  History of Present Illness:   [name] is a 48 y.o. [phone] Caucasian female being seen today for report of vulvovaginal itching x couple of years, will flare up and get white patchy areas/different texture inside of labia minora. Will get textured type appearance. Also had large painful irritated bump Lt labia minora x few months, squeezed some tissue appearing substance out of it, then actually pulled the remaining piece of tissue out a few weeks ago, has almost completely resolved, now just a hole. Some moles in inner labia minora. Rt labia minora hypertrophic.  Also would like hormones checked (testosterone and FSH), has h/o PCOS, has to shave neck/chin daily. Hot flashes, night sweats, mood swings, vaginal dryness. Had hysterectomy in past for prolapse, no bleeding/abnormal paps.     No LMP recorded. Patient has had a hysterectomy. The current method of family planning is status post hysterectomy.  Last pap prior to hysterectomy. Results were: normal     06/03/2023    8:45 AM 04/18/2020   10:14 AM 07/29/2016    4:10 PM  Depression screen PHQ 2/9  Decreased Interest 1 0 3  Down, Depressed, Hopeless 1 0 3  PHQ - 2 Score 2 0 6  Altered sleeping 3  2  Tired, decreased energy 3  3  Change in appetite 0  3  Feeling bad or failure about yourself  1  2  Trouble concentrating 3  3  Moving slowly or fidgety/restless 2  2  Suicidal thoughts 0  0  PHQ-9 Score 14  21        06/03/2023    8:45 AM  GAD 7 : Generalized Anxiety Score  Nervous, Anxious, on Edge 3  Control/stop worrying 3  Worry too much - different things 3  Trouble relaxing 3  Restless 2  Easily annoyed or irritable 2  Afraid - awful might happen 1  Total GAD 7 Score 17     Review of Systems:   Pertinent items are noted in HPI Denies fever/chills, dizziness,  headaches, visual disturbances, fatigue, shortness of breath, chest pain, abdominal pain, vomiting, abnormal vaginal discharge/itching/odor/irritation, problems with periods, bowel movements, urination, or intercourse unless otherwise stated above.  Pertinent History Reviewed:  Reviewed past medical,surgical, social, obstetrical and family history.  Reviewed problem list, medications and allergies. Physical Assessment:   Vitals:   06/03/23 0842  BP: 125/86  Pulse: 80  Weight: 214 lb (97.1 kg)  Height: 5\' 3"  (1.6 m)  Body mass index is 37.91 kg/m.       Physical Examination:   General appearance: alert, well appearing, and in no distress  Mental status: alert, oriented to person, place, and time  Skin: warm & dry   Cardiovascular: normal heart rate noted  Respiratory: normal respiratory effort, no distress  Abdomen: soft, non-tender   Pelvic: No visible evidence of white flaky patches today, texture appears normal, Rt labia minora hyertrophic but normal, area where bump was on Lt labia minora soft, appears normal, pin point hole appears to be closing well, nontender. Few small hyperpigmented dots inner labia minora- appears normal. 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 Some erythema irritation lower perineum/anus, has external hemorrhoid she is getting banded soon.  Spec exam: normal internal appearance and vaginal cuff, CV swab obtained  Extremities: no edema   Showed pictures pt had of white flaky  patches and bump Lt labia minora to Dr. Randolm Butte, appears to be lichen sclerosis and possibly a sebaceous cyst  Chaperone: Latisha Cresenzo  No results found for this or any previous visit (from the past 24 hours).  Assessment & Plan:  1) Lichen sclerosis/vulvovaginal itching> CV swab, rx Lidex-E qhs per Dr. Randolm Butte, f/u 6wks  2) Resolving Lt labia minora sebaceous cyst> appears to be almost completely healed, let us  know if returns  3) Hot flashes, night sweats, mood swings, vaginal dryness> will  check FSH (has had hysterectomy for prolapse, still has ovaries)  4) H/O PCOS, shaves chin/neck daily> check testosterone per request  Meds:  Meds ordered this encounter  Medications   fluocinonide-emollient (LIDEX-E) 0.05 % cream    Sig: Apply sparingly to areas of concern on vulva at bedtime    Dispense:  15 g    Refill:  6    Orders Placed This Encounter  Procedures   [facility]   Testosterone    Return in about 6 weeks (around 07/15/2023) for in person.  Ferd Householder CNM, [facility] 06/03/2023 9:37 AM

## 2023-06-04 ENCOUNTER — Ambulatory Visit: Payer: Self-pay | Admitting: Women's Health

## 2023-06-04 LAB — CERVICOVAGINAL ANCILLARY ONLY
Bacterial Vaginitis (gardnerella): NEGATIVE
Candida Glabrata: NEGATIVE
Candida Vaginitis: NEGATIVE
Chlamydia: NEGATIVE
Comment: NEGATIVE
Comment: NEGATIVE
Comment: NEGATIVE
Comment: NEGATIVE
Comment: NEGATIVE
Comment: NORMAL
Neisseria Gonorrhea: NEGATIVE
Trichomonas: NEGATIVE

## 2023-06-04 LAB — FOLLICLE STIMULATING HORMONE: FSH: 123 m[IU]/mL

## 2023-06-04 LAB — TESTOSTERONE: Testosterone: 44 ng/dL (ref 4–50)

## 2023-06-05 ENCOUNTER — Encounter: Admitting: Gastroenterology

## 2023-06-17 ENCOUNTER — Other Ambulatory Visit (HOSPITAL_COMMUNITY)
Admission: RE | Admit: 2023-06-17 | Discharge: 2023-06-17 | Disposition: A | Discharge status: Home / Self Care | Payer: Self-pay | Source: Ambulatory Visit | Attending: Oncology | Admitting: Oncology

## 2023-06-17 ENCOUNTER — Other Ambulatory Visit (HOSPITAL_COMMUNITY)

## 2023-06-19 ENCOUNTER — Encounter: Admitting: Gastroenterology

## 2023-06-25 ENCOUNTER — Other Ambulatory Visit: Payer: Self-pay

## 2023-06-25 ENCOUNTER — Telehealth: Payer: Self-pay | Admitting: Adult Health

## 2023-06-25 DIAGNOSIS — F902 Attention-deficit hyperactivity disorder, combined type: Secondary | ICD-10-CM

## 2023-06-25 MED ORDER — AMPHETAMINE-DEXTROAMPHETAMINE 30 MG PO TABS
30.0000 mg | ORAL_TABLET | Freq: Two times a day (BID) | ORAL | 0 refills | Status: DC
Start: 2023-07-23 — End: 2023-07-28

## 2023-06-25 MED ORDER — AMPHETAMINE-DEXTROAMPHETAMINE 30 MG PO TABS
30.0000 mg | ORAL_TABLET | Freq: Two times a day (BID) | ORAL | 0 refills | Status: DC
Start: 2023-06-25 — End: 2023-07-28

## 2023-06-25 NOTE — Telephone Encounter (Signed)
 Pt called asking for a refill on her adderall 30 mg. Pharmacy is [facility] on [address] in [place], [place]. Next appt is in august

## 2023-06-25 NOTE — Telephone Encounter (Signed)
 Pended Adderall RF to [facility] in [place].

## 2023-07-17 ENCOUNTER — Encounter (HOSPITAL_COMMUNITY): Payer: Self-pay | Admitting: *Deleted

## 2023-07-24 ENCOUNTER — Ambulatory Visit: Admitting: Women's Health

## 2023-07-24 ENCOUNTER — Encounter: Payer: Self-pay | Admitting: Women's Health

## 2023-07-24 ENCOUNTER — Ambulatory Visit: Admitting: Gastroenterology

## 2023-07-24 ENCOUNTER — Encounter: Payer: Self-pay | Admitting: Gastroenterology

## 2023-07-24 VITALS — BP 101/68 | HR 90 | Temp 98.2°F | Ht 63.5 in | Wt 214.8 lb

## 2023-07-24 VITALS — BP 108/74 | HR 79 | Ht 63.0 in | Wt 215.4 lb

## 2023-07-24 DIAGNOSIS — K648 Other hemorrhoids: Secondary | ICD-10-CM

## 2023-07-24 DIAGNOSIS — L900 Lichen sclerosus et atrophicus: Secondary | ICD-10-CM

## 2023-07-24 MED ORDER — NYSTATIN 100000 UNIT/GM EX OINT: 1.0000 | TOPICAL_OINTMENT | Freq: Two times a day (BID) | CUTANEOUS | 0 refills | Status: AC

## 2023-07-24 NOTE — Progress Notes (Signed)
   GYN VISIT Patient name: [name] MRN [id number]  Date of birth: 06/15/1975 Chief Complaint:   Follow-up  History of Present Illness:   [name] is a 48 y.o. [phone] Caucasian female being seen today for f/u on using lidex  cream rx'd 5/20 for lichen sclerosus. Much better, had flare perianal region ~ 7/1, used cream there and helped a lot. Still using nightly on vulvar tissues right now.  No LMP recorded. Patient has had a hysterectomy. The current method of family planning is status post hysterectomy.  Last pap prior to hysterectomy. Results were: normal     06/03/2023    8:45 AM 04/18/2020   10:14 AM 07/29/2016    4:10 PM  Depression screen PHQ 2/9  Decreased Interest 1 0 3  Down, Depressed, Hopeless 1 0 3  PHQ - 2 Score 2 0 6  Altered sleeping 3  2  Tired, decreased energy 3  3  Change in appetite 0  3  Feeling bad or failure about yourself  1  2  Trouble concentrating 3  3  Moving slowly or fidgety/restless 2  2  Suicidal thoughts 0  0  PHQ-9 Score 14  21        06/03/2023    8:45 AM  GAD 7 : Generalized Anxiety Score  Nervous, Anxious, on Edge 3  Control/stop worrying 3  Worry too much - different things 3  Trouble relaxing 3  Restless 2  Easily annoyed or irritable 2  Afraid - awful might happen 1  Total GAD 7 Score 17     Review of Systems:   Pertinent items are noted in HPI Denies fever/chills, dizziness, headaches, visual disturbances, fatigue, shortness of breath, chest pain, abdominal pain, vomiting, abnormal vaginal discharge/itching/odor/irritation, problems with periods, bowel movements, urination, or intercourse unless otherwise stated above.  Pertinent History Reviewed:  Reviewed past medical,surgical, social, obstetrical and family history.  Reviewed problem list, medications and allergies. Physical Assessment:   Vitals:   07/24/23 1535  BP: 108/74  Pulse: 79  Weight: 215 lb 6.4 oz (97.7 kg)  Height: 5' 3 (1.6 m)  Body mass index is 38.16  kg/m.       Physical Examination:   General appearance: alert, well appearing, and in no distress  Mental status: alert, oriented to person, place, and time  Skin: warm & dry   Cardiovascular: normal heart rate noted  Respiratory: normal respiratory effort, no distress  Abdomen: soft, non-tender   Pelvic: vulva and perianal region much improved  Extremities: no edema   Chaperone: Pt declined  No results found for this or any previous visit (from the past 24 hours).  Assessment & Plan:  1) Lichen sclerosus> improving w/ lidex  cream, to go to every other night for a few weeks, then 2x/wk. If flares can go back to more frequently if needed. OK to use in perianal region if needed. F/U  Meds: No orders of the defined types were placed in this encounter.   No orders of the defined types were placed in this encounter.   Return in about 3 months (around 10/24/2023) for GYN f/u.  Suzen JONELLE Fetters CNM, WHNP-BC 07/24/2023 4:20 PM

## 2023-07-24 NOTE — Patient Instructions (Signed)

## 2023-07-24 NOTE — Progress Notes (Signed)
    [facility] BANDING PROCEDURE NOTE  [name] is a 48 y.o. female presenting today for consideration of hemorrhoid banding. Last colonoscopy Aug 2023 with non-bleeding internal hemorrhoids, one 6 mm polyp in sigmoid (hyperplastic). Screening in 10 years. She had first banding in July 2023 of right anterior. Returns with itching, bleeding, pressure, prolapse.   The patient presents with symptomatic grade 2-3 hemorrhoids, unresponsive to maximal medical therapy, requesting rubber band ligation of her hemorrhoidal disease. All risks, benefits, and alternative forms of therapy were described and informed consent was obtained.   The decision was made to band the left lateral internal hemorrhoid, and the [facility] O'Regan System was used to perform band ligation without complication. Digital anorectal examination was then performed to assure proper positioning of the band, and to adjust the banded tissue as required. The patient was discharged home without pain or other issues. Dietary and behavioral recommendations were given, along with follow-up instructions. The patient will return in several weeks for followup and possible additional banding as required. Due to question of possible yeast perianally, I have sent in nystatin  cream.   No complications were encountered and the patient tolerated the procedure well.   [name], PhD, ANP-BC [facility]

## 2023-07-28 ENCOUNTER — Ambulatory Visit (INDEPENDENT_AMBULATORY_CARE_PROVIDER_SITE_OTHER): Payer: Commercial Managed Care - PPO | Admitting: Adult Health

## 2023-07-28 ENCOUNTER — Encounter: Payer: Self-pay | Admitting: Adult Health

## 2023-07-28 DIAGNOSIS — F331 Major depressive disorder, recurrent, moderate: Secondary | ICD-10-CM | POA: Diagnosis not present

## 2023-07-28 DIAGNOSIS — F411 Generalized anxiety disorder: Secondary | ICD-10-CM | POA: Diagnosis not present

## 2023-07-28 DIAGNOSIS — G4700 Insomnia, unspecified: Secondary | ICD-10-CM | POA: Diagnosis not present

## 2023-07-28 DIAGNOSIS — F902 Attention-deficit hyperactivity disorder, combined type: Secondary | ICD-10-CM | POA: Diagnosis not present

## 2023-07-28 MED ORDER — AMPHETAMINE-DEXTROAMPHETAMINE 30 MG PO TABS: 30.0000 mg | ORAL_TABLET | Freq: Every day | ORAL | 0 refills | Status: DC

## 2023-07-28 MED ORDER — AMPHETAMINE-DEXTROAMPHET ER 30 MG PO CP24: 30.0000 mg | ORAL_CAPSULE | Freq: Every day | ORAL | 0 refills | Status: DC

## 2023-07-28 MED ORDER — AMPHETAMINE-DEXTROAMPHETAMINE 30 MG PO TABS: ORAL_TABLET | ORAL | 0 refills | Status: DC

## 2023-07-28 MED ORDER — CLONIDINE HCL 0.2 MG PO TABS: ORAL_TABLET | ORAL | 5 refills | Status: DC

## 2023-07-28 MED ORDER — LAMOTRIGINE 150 MG PO TABS: ORAL_TABLET | ORAL | 5 refills | Status: DC

## 2023-07-28 MED ORDER — FLUOXETINE HCL 40 MG PO CAPS: ORAL_CAPSULE | ORAL | 5 refills | Status: DC

## 2023-07-28 MED ORDER — AMPHETAMINE-DEXTROAMPHETAMINE 30 MG PO TABS
30.0000 mg | ORAL_TABLET | Freq: Every day | ORAL | 0 refills | Status: DC
Start: 2023-08-25 — End: 2023-09-17

## 2023-07-28 NOTE — Progress Notes (Signed)
 [name] [id number] 09/11/1975 48 y.o.  Subjective:   Patient ID:  [name] is a 48 y.o. (DOB 1975-09-19) female.  Chief Complaint: No chief complaint on file.   HPI [name] presents to the office today for follow-up of ADHD, GAD, MDD and insomnia.   Describes mood today as "ok". Pleasant. Mood symptoms - reports situational anxiety, depression and irritability. Reports stable interest and motivation. Reports recent panic attacks. Reports some worry, rumination and over thinking. Reports mood has declined over the past month. Stating I feel like I'm struggling - but feels it is more work related. Feels like medications are helpful, but is willing to consider other options. Taking medications as prescribed.  Energy levels stable. Active, has a regular exercise routine. Walking. Enjoys some usual interests and activities. Married. Lives husband - has 3 sons. Spending time with family. Appetite varies - reports some stress eating. Weight gain - 15 pounds.  Sleeping well most nights. Averages 8 hours.  Focus and concentration improved, but feels like medications may need to be adjusted. Completing tasks. Managing aspects of household. Working full time at [employer].   Denies SI or HI.  Denies AH or VH. Denies self harm. Denies substance use.  Previous medications: Seroquel  - tremors, Wellbutrin   GAD-7    Flowsheet Row Office Visit from 06/03/2023 in [facility] for [facility] at [facility]  Total GAD-7 Score 17   PHQ2-9    Flowsheet Row Office Visit from 06/03/2023 in [facility] for [facility] Healthcare at [facility] Nutrition from 04/18/2020 in [facility]  - A Dept Of [place]. [facility] Office Visit from 07/29/2016 in [facility]  PHQ-2 Total Score 2 0 6  PHQ-9 Total Score 14 -- 21   Flowsheet Row Pre-Admission Testing 60 from 08/23/2021 in [place] [facility] from 12/06/2020 in [facility] at [facility] Testing 60 from 12/04/2020 in [place] [facility]  C-SSRS RISK CATEGORY No Risk No Risk No Risk     Review of Systems:  Review of Systems  Musculoskeletal:  Negative for gait problem.  Neurological:  Negative for tremors.  Psychiatric/Behavioral:         Please refer to HPI    Medications: I have reviewed the patient's current medications.  Current Outpatient Medications  Medication Sig Dispense Refill   acetaminophen  (TYLENOL ) 500 MG tablet Take 1,000 mg by mouth every 6 (six) hours as needed for moderate pain.     albuterol  (VENTOLIN  HFA) 108 (90 Base) MCG/ACT inhaler Inhale 1-2 puffs into the lungs every 6 (six) hours as needed for wheezing or shortness of breath.     amphetamine -dextroamphetamine  (ADDERALL) 30 MG tablet Take one tablet twice daily. 60 tablet 0   amphetamine -dextroamphetamine  (ADDERALL) 30 MG tablet Take 1 tablet by mouth 2 (two) times daily. 60 tablet 0   amphetamine -dextroamphetamine  (ADDERALL) 30 MG tablet Take 1 tablet by mouth 2 (two) times daily. (Patient not taking: Reported on 07/24/2023) 60 tablet 0   cloNIDine  (CATAPRES ) 0.2 MG tablet Take two to three tablets at bedtime for sleep. 90 tablet 5   fluocinonide -emollient (LIDEX -E) 0.05 % cream Apply sparingly to areas of concern on vulva at bedtime 15 g 6   FLUoxetine  (PROZAC ) 40 MG capsule TAKE 1 CAPSULE BY MOUTH DAILY 30 capsule 5   lamoTRIgine  (LAMICTAL ) 150 MG tablet Take one tablet at bedtime. 30 tablet 5   Multiple Vitamin (MULTIVITAMIN WITH MINERALS)  TABS tablet Take 1 tablet by mouth daily.     nystatin  ointment (MYCOSTATIN ) Apply 1 Application topically 2 (two) times daily. To between buttocks on rash (Patient not taking: Reported on 07/24/2023) 30 g 0   No current facility-administered medications for this visit.    Medication Side Effects: None  Allergies:  Allergies  Allergen Reactions   Codeine Nausea And Vomiting   Other Nausea  Only    Pt has severe nausea with anesthesia   Wound Dressing Adhesive Rash    Past Medical History:  Diagnosis Date   Anxiety    Arthritis    Bilateral knees and bilateral ankles   Asthma    Autism    Bipolar disorder (HCC)    Depression    Family history of adverse reaction to anesthesia    Post op nausea and vomiting  mom and brother   Family history of breast cancer    Family history of kidney cancer    Family history of prostate cancer    GERD (gastroesophageal reflux disease)    Headache    Migraines   History of hiatal hernia    History of shoulder surgery    Hypothyroidism    Obesity    PCOS (polycystic ovarian syndrome)    Plantar fasciitis 04/26/2013   Pneumonia    PONV (postoperative nausea and vomiting)    Post op nausea and vomiting    Past Medical History, Surgical history, Social history, and Family history were reviewed and updated as appropriate.   Please see review of systems for further details on the patient's review from today.   Objective:   Physical Exam:  There were no vitals taken for this visit.  Physical Exam Constitutional:      General: She is not in acute distress. Musculoskeletal:        General: No deformity.  Neurological:     Mental Status: She is alert and oriented to person, place, and time.     Coordination: Coordination normal.  Psychiatric:        Attention and Perception: Attention and perception normal. She does not perceive auditory or visual hallucinations.        Mood and Affect: Mood is anxious and depressed. Affect is not labile, blunt, angry or inappropriate.        Speech: Speech normal.        Behavior: Behavior normal.        Thought Content: Thought content normal. Thought content is not paranoid or delusional. Thought content does not include homicidal or suicidal ideation. Thought content does not include homicidal or suicidal plan.        Cognition and Memory: Cognition and memory normal.        Judgment:  Judgment normal.     Comments: Insight intact     Lab Review:     Component Value Date/Time   NA 134 (L) 12/12/2020 0427   K 4.5 12/12/2020 0427   CL 103 12/12/2020 0427   CO2 23 12/12/2020 0427   GLUCOSE 129 (H) 12/12/2020 0427   BUN 14 12/12/2020 0427   CREATININE 0.75 12/12/2020 0427   CALCIUM 8.2 (L) 12/12/2020 0427   PROT 6.9 12/12/2020 0427   ALBUMIN 3.4 (L) 12/12/2020 0427   AST 23 12/12/2020 0427   ALT 23 12/12/2020 0427   ALKPHOS 79 12/12/2020 0427   BILITOT 0.5 12/12/2020 0427   GFRNONAA >60 12/12/2020 0427       Component Value Date/Time   WBC 12.0 (  H) 12/12/2020 0427   RBC 4.13 12/12/2020 0427   HGB 12.2 12/12/2020 0427   HCT 37.3 12/12/2020 0427   PLT 331 12/12/2020 0427   MCV 90.3 12/12/2020 0427   MCH 29.5 12/12/2020 0427   MCHC 32.7 12/12/2020 0427   RDW 12.4 12/12/2020 0427   LYMPHSABS 1.1 12/12/2020 0427   MONOABS 0.2 12/12/2020 0427   EOSABS 0.0 12/12/2020 0427   BASOSABS 0.0 12/12/2020 0427    No results found for: POCLITH, LITHIUM   No results found for: PHENYTOIN, PHENOBARB, VALPROATE, CBMZ   .res Assessment: Plan:    Plan:  PDMP reviewed   Prozac  40mg  daily Lamictal  150mg  daily Clonidine  0.2mg  - 3 at hs Adderall 30mg  daily   Adderall XR 30mg  daily with IR  Monitor BP between visits while taking stimulant medication.   RTC 6 months  - will call in 3 months for next set of refills on stimulant.  Patient advised to contact office with any questions, adverse effects, or acute worsening in signs and symptoms.  Discussed potential benefits, risks, and side effects of stimulants with patient to include increased heart rate, palpitations, insomnia, increased anxiety, increased irritability, or decreased appetite.  Instructed patient to contact office if experiencing any significant tolerability issues.  Counseled patient regarding potential benefits, risks, and side effects of Lamictal  to include potential risk of  Stevens-Johnson syndrome. Advised patient to stop taking Lamictal  and contact office immediately if rash develops and to seek urgent medical attention if rash is severe and/or spreading quickly.  Patient advised to contact office with any questions, adverse effects, or acute worsening in signs and symptoms.  There are no diagnoses linked to this encounter.   Please see After Visit Summary for patient specific instructions.  Future Appointments  Date Time Provider Department Center  07/28/2023  5:15 PM Natividad Schlosser Nattalie, NP CP-CP None  08/26/2023 10:00 AM Shirlean Therisa ORN, NP R[facility]RGA [facility]    No orders of the defined types were placed in this encounter.   -------------------------------

## 2023-08-26 ENCOUNTER — Ambulatory Visit (INDEPENDENT_AMBULATORY_CARE_PROVIDER_SITE_OTHER): Admitting: Gastroenterology

## 2023-08-26 VITALS — BP 114/74 | HR 93 | Temp 98.6°F | Ht 63.0 in | Wt 219.4 lb

## 2023-08-26 DIAGNOSIS — K648 Other hemorrhoids: Secondary | ICD-10-CM | POA: Diagnosis not present

## 2023-08-26 NOTE — Patient Instructions (Signed)

## 2023-08-26 NOTE — Progress Notes (Signed)
      [facility] BANDING PROCEDURE NOTE  [name] is a 48 y.o. female presenting today for consideration of hemorrhoid banding. Last colonoscopy Aug 2023 with non-bleeding internal hemorrhoids, one 6 mm polyp in sigmoid (hyperplastic). Screening in 10 years. She had first banding in July 2023 of right anterior. Left lateral completed at July visit. She has bleeding, itching, prolapsing.    The patient presents with symptomatic grade 2-3 hemorrhoids, unresponsive to maximal medical therapy, requesting rubber band ligation of her hemorrhoidal disease. All risks, benefits, and alternative forms of therapy were described and informed consent was obtained.  In the left lateral decubitus position, anoscopic examination revealed grade 2-3 hemorrhoids in the right posterior and also additional smaller tissue left in left lateral position (s). Right anterior scarring from prior banding noted without recurrent hemorrhoid.   The decision was made to band the right posterior internal hemorrhoid, and the [facility] O'Regan System was used to perform band ligation without complication. Digital anorectal examination was then performed to assure proper positioning of the band, and to adjust the banded tissue as required. The patient was discharged home without pain or other issues. Dietary and behavioral recommendations were given, along with follow-up instructions. The patient will return in several weeks for followup and possible additional banding as required. Will focus on left lateral at next visit.   No complications were encountered and the patient tolerated the procedure well.   [name], PhD, ANP-BC [facility]

## 2023-09-17 ENCOUNTER — Other Ambulatory Visit: Payer: Self-pay

## 2023-09-17 ENCOUNTER — Telehealth: Payer: Self-pay | Admitting: Adult Health

## 2023-09-17 DIAGNOSIS — F902 Attention-deficit hyperactivity disorder, combined type: Secondary | ICD-10-CM

## 2023-09-17 NOTE — Telephone Encounter (Signed)
 Patient's husband came into office with message via [place]. States that she is on Adderall ER and would like to switch back to regular Adderall. PH: [phone] Appt 10/15

## 2023-09-17 NOTE — Telephone Encounter (Signed)
 Pt had one RF of Adderall 30 XR, had previously been getting 30 mg IR BID. LVM to RC to verify dosing.

## 2023-09-17 NOTE — Telephone Encounter (Signed)
 Called patient to verify dose of Adderall IR. She has RF for 1 a day, but per PMPD she had been taking twice a day. She said when [place] prescribed 30XR she changed IR to once a day for a boost. She said she did not like the 30XR and is asking to go back to 30 IR BID, at least until she is seen next.

## 2023-09-17 NOTE — Telephone Encounter (Signed)
 Pended

## 2023-09-17 NOTE — Telephone Encounter (Signed)
 LF 7/30

## 2023-09-18 MED ORDER — AMPHETAMINE-DEXTROAMPHETAMINE 30 MG PO TABS
30.0000 mg | ORAL_TABLET | Freq: Two times a day (BID) | ORAL | 0 refills | Status: DC
Start: 2023-10-16 — End: 2023-10-29

## 2023-09-18 MED ORDER — AMPHETAMINE-DEXTROAMPHETAMINE 30 MG PO TABS
30.0000 mg | ORAL_TABLET | Freq: Two times a day (BID) | ORAL | 0 refills | Status: DC
Start: 2023-09-18 — End: 2023-10-29

## 2023-10-07 ENCOUNTER — Ambulatory Visit: Admitting: Gastroenterology

## 2023-10-29 ENCOUNTER — Ambulatory Visit: Admitting: Adult Health

## 2023-10-29 ENCOUNTER — Encounter: Payer: Self-pay | Admitting: Adult Health

## 2023-10-29 DIAGNOSIS — F411 Generalized anxiety disorder: Secondary | ICD-10-CM | POA: Diagnosis not present

## 2023-10-29 DIAGNOSIS — F331 Major depressive disorder, recurrent, moderate: Secondary | ICD-10-CM

## 2023-10-29 DIAGNOSIS — F5105 Insomnia due to other mental disorder: Secondary | ICD-10-CM

## 2023-10-29 DIAGNOSIS — F902 Attention-deficit hyperactivity disorder, combined type: Secondary | ICD-10-CM | POA: Diagnosis not present

## 2023-10-29 DIAGNOSIS — F99 Mental disorder, not otherwise specified: Secondary | ICD-10-CM

## 2023-10-29 MED ORDER — LAMOTRIGINE 150 MG PO TABS: ORAL_TABLET | ORAL | 5 refills | Status: AC

## 2023-10-29 MED ORDER — FLUOXETINE HCL 40 MG PO CAPS: ORAL_CAPSULE | ORAL | 5 refills | Status: AC

## 2023-10-29 MED ORDER — AMPHETAMINE-DEXTROAMPHETAMINE 30 MG PO TABS: 30.0000 mg | ORAL_TABLET | Freq: Two times a day (BID) | ORAL | 0 refills | Status: AC

## 2023-10-29 MED ORDER — CLONIDINE HCL 0.2 MG PO TABS
ORAL_TABLET | ORAL | 5 refills | Status: AC
Start: 2023-10-29 — End: ?

## 2023-10-29 NOTE — Progress Notes (Signed)
 [name] [id number] [birth year] 48 y.o.  Subjective:   Patient ID:  [name] is a 48 y.o. (DOB Mar 01, 1975) female.  Chief Complaint: No chief complaint on file.   HPI [name] presents to the office today for follow-up of ADHD, GAD, MDD and insomnia.   Describes mood today as "ok". Pleasant. Mood symptoms - reports situational anxiety, depression and irritability. Reports stable interest and motivation. Reports a few panic attacks situational. Reports some worry, rumination and over thinking. Reports mood as stable. Stating I feel like I'm doing alright.. Feels like medications are working well. Taking medications as prescribed.  Energy levels stable. Active, has a regular exercise routine. Walking. Enjoys some usual interests and activities. Married. Lives husband - has 3 sons. Spending time with family. Appetite adequate. Reports weight gain.  Sleeping well most nights. Averages 8 hours.  Focus and concentration improved. Completing tasks. Managing aspects of household. Working full time at [employer].   Denies SI or HI.  Denies AH or VH. Denies self harm. Denies substance use.  Previous medications: Seroquel  - tremors, Wellbutrin   GAD-7    Flowsheet Row Office Visit from 06/03/2023 in [facility] for [facility] Healthcare at [facility]  Total GAD-7 Score 17   PHQ2-9    Flowsheet Row Office Visit from 06/03/2023 in [facility] for [facility] Healthcare at [facility] Nutrition from 04/18/2020 in [place] [facility]  - A Dept Of [place]. [facility] Office Visit from 07/29/2016 in [facility]  PHQ-2 Total Score 2 0 6  PHQ-9 Total Score 14 -- 21   Flowsheet Row Pre-Admission Testing 60 from 08/23/2021 in [place] [facility] from 12/06/2020 in [facility] at [facility] Testing 60 from 12/04/2020 in [place] [facility]  C-SSRS RISK CATEGORY No Risk No Risk No Risk      Review of Systems:  Review of Systems  Musculoskeletal:  Negative for gait problem.  Neurological:  Negative for tremors.  Psychiatric/Behavioral:         Please refer to HPI    Medications: I have reviewed the patient's current medications.  Current Outpatient Medications  Medication Sig Dispense Refill   [START ON 12/24/2023] amphetamine -dextroamphetamine  (ADDERALL) 30 MG tablet Take 1 tablet by mouth 2 (two) times daily. 60 tablet 0   acetaminophen  (TYLENOL ) 500 MG tablet Take 1,000 mg by mouth every 6 (six) hours as needed for moderate pain.     albuterol  (VENTOLIN  HFA) 108 (90 Base) MCG/ACT inhaler Inhale 1-2 puffs into the lungs every 6 (six) hours as needed for wheezing or shortness of breath.     amphetamine -dextroamphetamine  (ADDERALL) 30 MG tablet Take 1 tablet by mouth 2 (two) times daily. 60 tablet 0   [START ON 11/26/2023] amphetamine -dextroamphetamine  (ADDERALL) 30 MG tablet Take 1 tablet by mouth 2 (two) times daily. 60 tablet 0   cloNIDine  (CATAPRES ) 0.2 MG tablet Take two to three tablets at bedtime for sleep. 90 tablet 5   fluocinonide -emollient (LIDEX -E) 0.05 % cream Apply sparingly to areas of concern on vulva at bedtime 15 g 6   FLUoxetine  (PROZAC ) 40 MG capsule TAKE 1 CAPSULE BY MOUTH DAILY 30 capsule 5   lamoTRIgine  (LAMICTAL ) 150 MG tablet Take one tablet at bedtime. 30 tablet 5   Multiple Vitamin (MULTIVITAMIN WITH MINERALS) TABS tablet Take 1 tablet by mouth daily.     nystatin  ointment (MYCOSTATIN ) Apply 1 Application topically 2 (two) times daily. To between buttocks on  rash (Patient not taking: Reported on 08/26/2023) 30 g 0   No current facility-administered medications for this visit.    Medication Side Effects: None  Allergies:  Allergies  Allergen Reactions   Codeine Nausea And Vomiting   Other Nausea Only    Pt has severe nausea with anesthesia   Wound Dressing Adhesive Rash    Past Medical History:  Diagnosis Date   Anxiety     Arthritis    Bilateral knees and bilateral ankles   Asthma    Autism    Bipolar disorder (HCC)    Depression    Family history of adverse reaction to anesthesia    Post op nausea and vomiting  mom and brother   Family history of breast cancer    Family history of kidney cancer    Family history of prostate cancer    GERD (gastroesophageal reflux disease)    Headache    Migraines   History of hiatal hernia    History of shoulder surgery    Hypothyroidism    Obesity    PCOS (polycystic ovarian syndrome)    Plantar fasciitis 04/26/2013   Pneumonia    PONV (postoperative nausea and vomiting)    Post op nausea and vomiting    Past Medical History, Surgical history, Social history, and Family history were reviewed and updated as appropriate.   Please see review of systems for further details on the patient's review from today.   Objective:   Physical Exam:  There were no vitals taken for this visit.  Physical Exam Constitutional:      General: She is not in acute distress. Musculoskeletal:        General: No deformity.  Neurological:     Mental Status: She is alert and oriented to person, place, and time.     Coordination: Coordination normal.  Psychiatric:        Attention and Perception: Attention and perception normal. She does not perceive auditory or visual hallucinations.        Mood and Affect: Mood normal. Mood is not anxious or depressed. Affect is not labile, blunt, angry or inappropriate.        Speech: Speech normal.        Behavior: Behavior normal.        Thought Content: Thought content normal. Thought content is not paranoid or delusional. Thought content does not include homicidal or suicidal ideation. Thought content does not include homicidal or suicidal plan.        Cognition and Memory: Cognition and memory normal.        Judgment: Judgment normal.     Comments: Insight intact     Lab Review:     Component Value Date/Time   NA 134 (L) 12/12/2020  0427   K 4.5 12/12/2020 0427   CL 103 12/12/2020 0427   CO2 23 12/12/2020 0427   GLUCOSE 129 (H) 12/12/2020 0427   BUN 14 12/12/2020 0427   CREATININE 0.75 12/12/2020 0427   CALCIUM 8.2 (L) 12/12/2020 0427   PROT 6.9 12/12/2020 0427   ALBUMIN 3.4 (L) 12/12/2020 0427   AST 23 12/12/2020 0427   ALT 23 12/12/2020 0427   ALKPHOS 79 12/12/2020 0427   BILITOT 0.5 12/12/2020 0427   GFRNONAA >60 12/12/2020 0427       Component Value Date/Time   WBC 12.0 (H) 12/12/2020 0427   RBC 4.13 12/12/2020 0427   HGB 12.2 12/12/2020 0427   HCT 37.3 12/12/2020 0427   PLT  331 12/12/2020 0427   MCV 90.3 12/12/2020 0427   MCH 29.5 12/12/2020 0427   MCHC 32.7 12/12/2020 0427   RDW 12.4 12/12/2020 0427   LYMPHSABS 1.1 12/12/2020 0427   MONOABS 0.2 12/12/2020 0427   EOSABS 0.0 12/12/2020 0427   BASOSABS 0.0 12/12/2020 0427    No results found for: POCLITH, LITHIUM   No results found for: PHENYTOIN, PHENOBARB, VALPROATE, CBMZ   .res Assessment: Plan:    Plan:  PDMP reviewed   Prozac  40mg  daily Lamictal  150mg  daily Clonidine  0.2mg  - 3 at hs Adderall 30mg  twice daily   Monitor BP between visits while taking stimulant medication.   RTC 6 months  - will call in 3 months for next set of refills on stimulant.  25 minutes spent dedicated to the care of this patient on the date of this encounter to include pre-visit review of records, ordering of medication, post visit documentation, and face-to-face time with the patient discussing  ADHD, GAD, MDD and insomnia. Discussed continuing current medication regimen.  Patient advised to contact office with any questions, adverse effects, or acute worsening in signs and symptoms.  Discussed potential benefits, risks, and side effects of stimulants with patient to include increased heart rate, palpitations, insomnia, increased anxiety, increased irritability, or decreased appetite.  Instructed patient to contact office if experiencing any  significant tolerability issues.  Counseled patient regarding potential benefits, risks, and side effects of Lamictal  to include potential risk of Stevens-Johnson syndrome. Advised patient to stop taking Lamictal  and contact office immediately if rash develops and to seek urgent medical attention if rash is severe and/or spreading quickly.  Patient advised to contact office with any questions, adverse effects, or acute worsening in signs and symptoms.  Diagnoses and all orders for this visit:  Major depressive disorder, recurrent episode, moderate (HCC) -     lamoTRIgine  (LAMICTAL ) 150 MG tablet; Take one tablet at bedtime. -     FLUoxetine  (PROZAC ) 40 MG capsule; TAKE 1 CAPSULE BY MOUTH DAILY  Attention deficit hyperactivity disorder (ADHD), combined type -     cloNIDine  (CATAPRES ) 0.2 MG tablet; Take two to three tablets at bedtime for sleep. -     amphetamine -dextroamphetamine  (ADDERALL) 30 MG tablet; Take 1 tablet by mouth 2 (two) times daily. -     amphetamine -dextroamphetamine  (ADDERALL) 30 MG tablet; Take 1 tablet by mouth 2 (two) times daily. -     amphetamine -dextroamphetamine  (ADDERALL) 30 MG tablet; Take 1 tablet by mouth 2 (two) times daily.  Insomnia due to other mental disorder  Generalized anxiety disorder     Please see After Visit Summary for patient specific instructions.  Future Appointments  Date Time Provider Department Center  11/18/2023  1:30 PM Shirlean Therisa ORN, NP [facility]  05/04/2024  1:00 PM Kyro Joswick Nattalie, NP CP-CP None    No orders of the defined types were placed in this encounter.   -------------------------------

## 2023-11-18 ENCOUNTER — Encounter: Payer: Self-pay | Admitting: Gastroenterology

## 2023-11-18 ENCOUNTER — Ambulatory Visit: Admitting: Gastroenterology

## 2023-11-18 VITALS — BP 123/86 | HR 89 | Temp 98.0°F | Ht 63.0 in | Wt 227.4 lb

## 2023-11-18 DIAGNOSIS — K648 Other hemorrhoids: Secondary | ICD-10-CM | POA: Diagnosis not present

## 2023-11-18 NOTE — Patient Instructions (Signed)
 We will see you back as needed!  Please call with any concerns!  Congrats on the upcoming sweet baby!  I enjoyed seeing you again today! I value our relationship and want to provide genuine, compassionate, and quality care. You may receive a survey regarding your visit with me, and I welcome your feedback! Thanks so much for taking the time to complete this. I look forward to seeing you again.      [name], PhD, ANP-BC [facility] Gastroenterology

## 2023-11-18 NOTE — Progress Notes (Signed)
    [facility] BANDING PROCEDURE NOTE  [name] is a 48 y.o. female presenting today for consideration of hemorrhoid banding. Last colonoscopy  Aug 2023 with non-bleeding internal hemorrhoids, one 6 mm polyp in sigmoid (hyperplastic). Screening in 10 years. She had first banding in July 2023 of right anterior. Banding again this year with left lateral in July and right posterior in Aug 2025. At last visit, anoscopy showed Grade 2-3 hemorrhoids in right posterior and redundant tissue in left lateral. Right anterior old scar without recurrent hemorrhoid. She has had improvement in overall symptoms. Mild pressure occasionally, no bleeding.   The patient presents with symptomatic grade 2-3 hemorrhoids, unresponsive to maximal medical therapy, requesting rubber band ligation of her hemorrhoidal disease. All risks, benefits, and alternative forms of therapy were described and informed consent was obtained.  The decision was made to band the left lateral internal hemorrhoid, and the [facility] O'Regan System was used to perform band ligation without complication. Digital anorectal examination was then performed to assure proper positioning of the band, and to adjust the banded tissue as required. The patient was discharged home without pain or other issues. Dietary and behavioral recommendations were given, along with follow-up instructions. The patient will return as needed.  No complications were encountered and the patient tolerated the procedure well.   [name], PhD, ANP-BC [facility] Gastroenterology

## 2024-05-04 ENCOUNTER — Telehealth: Admitting: Adult Health
